# Patient Record
Sex: Male | Born: 1940 | Race: White | Hispanic: No | Marital: Married | Smoking: Never smoker
Health system: Southern US, Community
[De-identification: ages and names within clinical notes are randomized; demographics above are authoritative.]

## PROBLEM LIST (undated history)

## (undated) DIAGNOSIS — R778 Other specified abnormalities of plasma proteins: Secondary | ICD-10-CM

## (undated) DIAGNOSIS — R112 Nausea with vomiting, unspecified: Secondary | ICD-10-CM

## (undated) DIAGNOSIS — W5911XA Bitten by nonvenomous snake, initial encounter: Secondary | ICD-10-CM

## (undated) DIAGNOSIS — I10 Essential (primary) hypertension: Secondary | ICD-10-CM

## (undated) DIAGNOSIS — R079 Chest pain, unspecified: Secondary | ICD-10-CM

## (undated) DIAGNOSIS — R9439 Abnormal result of other cardiovascular function study: Secondary | ICD-10-CM

## (undated) DIAGNOSIS — R7309 Other abnormal glucose: Secondary | ICD-10-CM

## (undated) DIAGNOSIS — I471 Supraventricular tachycardia, unspecified: Secondary | ICD-10-CM

## (undated) DIAGNOSIS — I700 Atherosclerosis of aorta: Secondary | ICD-10-CM

## (undated) DIAGNOSIS — R7989 Other specified abnormal findings of blood chemistry: Secondary | ICD-10-CM

## (undated) DIAGNOSIS — R55 Syncope and collapse: Secondary | ICD-10-CM

## (undated) HISTORY — DX: Chest pain, unspecified: R07.9

## (undated) HISTORY — DX: Other abnormal glucose: R73.09

## (undated) HISTORY — DX: Atherosclerosis of aorta: I70.0

## (undated) HISTORY — DX: Abnormal result of other cardiovascular function study: R94.39

## (undated) HISTORY — DX: Bitten by nonvenomous snake, initial encounter: W59.11XA

## (undated) HISTORY — DX: Syncope and collapse: R55

## (undated) HISTORY — DX: Other specified abnormalities of plasma proteins: R77.8

## (undated) HISTORY — DX: Other specified abnormal findings of blood chemistry: R79.89

## (undated) HISTORY — DX: Supraventricular tachycardia, unspecified: I47.10

## (undated) HISTORY — DX: Supraventricular tachycardia: I47.1

## (undated) HISTORY — DX: Essential (primary) hypertension: I10

## (undated) HISTORY — DX: Nausea with vomiting, unspecified: R11.2

---

## 2016-11-17 DIAGNOSIS — R0682 Tachypnea, not elsewhere classified: Secondary | ICD-10-CM | POA: Diagnosis not present

## 2016-11-17 DIAGNOSIS — I2510 Atherosclerotic heart disease of native coronary artery without angina pectoris: Secondary | ICD-10-CM | POA: Diagnosis not present

## 2016-11-17 DIAGNOSIS — I10 Essential (primary) hypertension: Secondary | ICD-10-CM | POA: Diagnosis not present

## 2016-11-17 DIAGNOSIS — R55 Syncope and collapse: Secondary | ICD-10-CM | POA: Diagnosis not present

## 2016-11-17 DIAGNOSIS — R9430 Abnormal result of cardiovascular function study, unspecified: Secondary | ICD-10-CM | POA: Diagnosis not present

## 2016-11-18 DIAGNOSIS — Z7982 Long term (current) use of aspirin: Secondary | ICD-10-CM | POA: Diagnosis not present

## 2016-11-18 DIAGNOSIS — Z87891 Personal history of nicotine dependence: Secondary | ICD-10-CM | POA: Diagnosis not present

## 2016-11-18 DIAGNOSIS — R55 Syncope and collapse: Secondary | ICD-10-CM | POA: Diagnosis not present

## 2016-11-18 DIAGNOSIS — I10 Essential (primary) hypertension: Secondary | ICD-10-CM | POA: Diagnosis not present

## 2016-11-18 DIAGNOSIS — A09 Infectious gastroenteritis and colitis, unspecified: Secondary | ICD-10-CM | POA: Diagnosis not present

## 2016-11-18 DIAGNOSIS — Z833 Family history of diabetes mellitus: Secondary | ICD-10-CM | POA: Diagnosis not present

## 2016-11-18 DIAGNOSIS — R9430 Abnormal result of cardiovascular function study, unspecified: Secondary | ICD-10-CM | POA: Diagnosis not present

## 2016-11-18 DIAGNOSIS — Z8249 Family history of ischemic heart disease and other diseases of the circulatory system: Secondary | ICD-10-CM | POA: Diagnosis not present

## 2016-11-18 DIAGNOSIS — K529 Noninfective gastroenteritis and colitis, unspecified: Secondary | ICD-10-CM | POA: Diagnosis not present

## 2016-11-18 DIAGNOSIS — I214 Non-ST elevation (NSTEMI) myocardial infarction: Secondary | ICD-10-CM | POA: Diagnosis not present

## 2016-11-18 DIAGNOSIS — I2510 Atherosclerotic heart disease of native coronary artery without angina pectoris: Secondary | ICD-10-CM | POA: Diagnosis not present

## 2016-11-18 DIAGNOSIS — Z823 Family history of stroke: Secondary | ICD-10-CM | POA: Diagnosis not present

## 2016-11-19 ENCOUNTER — Observation Stay (HOSPITAL_COMMUNITY): Payer: Medicare Other

## 2016-11-19 ENCOUNTER — Inpatient Hospital Stay (HOSPITAL_COMMUNITY)
Admission: AD | Admit: 2016-11-19 | Discharge: 2016-11-29 | DRG: 234 | Disposition: A | Discharge status: Home / Self Care | Payer: Medicare Other | Source: Other Acute Inpatient Hospital | Attending: Cardiothoracic Surgery | Admitting: Cardiothoracic Surgery

## 2016-11-19 ENCOUNTER — Encounter (HOSPITAL_COMMUNITY): Payer: Self-pay

## 2016-11-19 DIAGNOSIS — K529 Noninfective gastroenteritis and colitis, unspecified: Secondary | ICD-10-CM | POA: Diagnosis present

## 2016-11-19 DIAGNOSIS — Z951 Presence of aortocoronary bypass graft: Secondary | ICD-10-CM

## 2016-11-19 DIAGNOSIS — Z8249 Family history of ischemic heart disease and other diseases of the circulatory system: Secondary | ICD-10-CM

## 2016-11-19 DIAGNOSIS — R778 Other specified abnormalities of plasma proteins: Secondary | ICD-10-CM

## 2016-11-19 DIAGNOSIS — R55 Syncope and collapse: Secondary | ICD-10-CM

## 2016-11-19 DIAGNOSIS — R748 Abnormal levels of other serum enzymes: Secondary | ICD-10-CM | POA: Diagnosis not present

## 2016-11-19 DIAGNOSIS — R112 Nausea with vomiting, unspecified: Secondary | ICD-10-CM

## 2016-11-19 DIAGNOSIS — Z01810 Encounter for preprocedural cardiovascular examination: Secondary | ICD-10-CM | POA: Diagnosis not present

## 2016-11-19 DIAGNOSIS — R079 Chest pain, unspecified: Secondary | ICD-10-CM | POA: Diagnosis present

## 2016-11-19 DIAGNOSIS — E8770 Fluid overload, unspecified: Secondary | ICD-10-CM | POA: Diagnosis not present

## 2016-11-19 DIAGNOSIS — E871 Hypo-osmolality and hyponatremia: Secondary | ICD-10-CM | POA: Diagnosis present

## 2016-11-19 DIAGNOSIS — I214 Non-ST elevation (NSTEMI) myocardial infarction: Secondary | ICD-10-CM | POA: Diagnosis not present

## 2016-11-19 DIAGNOSIS — D62 Acute posthemorrhagic anemia: Secondary | ICD-10-CM | POA: Diagnosis not present

## 2016-11-19 DIAGNOSIS — I25110 Atherosclerotic heart disease of native coronary artery with unstable angina pectoris: Secondary | ICD-10-CM | POA: Diagnosis not present

## 2016-11-19 DIAGNOSIS — I471 Supraventricular tachycardia: Secondary | ICD-10-CM | POA: Diagnosis not present

## 2016-11-19 DIAGNOSIS — R7989 Other specified abnormal findings of blood chemistry: Secondary | ICD-10-CM

## 2016-11-19 DIAGNOSIS — R002 Palpitations: Secondary | ICD-10-CM | POA: Diagnosis not present

## 2016-11-19 DIAGNOSIS — R9439 Abnormal result of other cardiovascular function study: Secondary | ICD-10-CM

## 2016-11-19 DIAGNOSIS — I10 Essential (primary) hypertension: Secondary | ICD-10-CM

## 2016-11-19 DIAGNOSIS — I2510 Atherosclerotic heart disease of native coronary artery without angina pectoris: Secondary | ICD-10-CM

## 2016-11-19 DIAGNOSIS — Z87891 Personal history of nicotine dependence: Secondary | ICD-10-CM

## 2016-11-19 DIAGNOSIS — J42 Unspecified chronic bronchitis: Secondary | ICD-10-CM | POA: Diagnosis not present

## 2016-11-19 DIAGNOSIS — J9811 Atelectasis: Secondary | ICD-10-CM | POA: Diagnosis not present

## 2016-11-19 DIAGNOSIS — R072 Precordial pain: Secondary | ICD-10-CM | POA: Diagnosis not present

## 2016-11-19 DIAGNOSIS — I951 Orthostatic hypotension: Secondary | ICD-10-CM | POA: Diagnosis not present

## 2016-11-19 LAB — CBC WITH DIFFERENTIAL/PLATELET
BASOS ABS: 0.3 10*3/uL — AB (ref 0.0–0.1)
Basophils Relative: 3 %
EOS ABS: 0 10*3/uL (ref 0.0–0.7)
Eosinophils Relative: 0 %
HCT: 38.3 % — ABNORMAL LOW (ref 39.0–52.0)
Hemoglobin: 13.1 g/dL (ref 13.0–17.0)
LYMPHS ABS: 5.7 10*3/uL — AB (ref 0.7–4.0)
Lymphocytes Relative: 65 %
MCH: 29.2 pg (ref 26.0–34.0)
MCHC: 34.2 g/dL (ref 30.0–36.0)
MCV: 85.5 fL (ref 78.0–100.0)
Monocytes Absolute: 0.9 10*3/uL (ref 0.1–1.0)
Monocytes Relative: 10 %
NEUTROS ABS: 2 10*3/uL (ref 1.7–7.7)
Neutrophils Relative %: 22 %
PLATELETS: 139 10*3/uL — AB (ref 150–400)
RBC: 4.48 MIL/uL (ref 4.22–5.81)
RDW: 13.4 % (ref 11.5–15.5)
WBC: 8.9 10*3/uL (ref 4.0–10.5)

## 2016-11-19 LAB — TSH: TSH: 1.637 u[IU]/mL (ref 0.350–4.500)

## 2016-11-19 LAB — PROTIME-INR
INR: 1.2
PROTHROMBIN TIME: 15.3 s — AB (ref 11.4–15.2)

## 2016-11-19 LAB — COMPREHENSIVE METABOLIC PANEL
ALT: 47 U/L (ref 17–63)
AST: 48 U/L — ABNORMAL HIGH (ref 15–41)
Albumin: 3.3 g/dL — ABNORMAL LOW (ref 3.5–5.0)
Alkaline Phosphatase: 58 U/L (ref 38–126)
Anion gap: 7 (ref 5–15)
BILIRUBIN TOTAL: 0.7 mg/dL (ref 0.3–1.2)
BUN: 15 mg/dL (ref 6–20)
CHLORIDE: 100 mmol/L — AB (ref 101–111)
CO2: 25 mmol/L (ref 22–32)
CREATININE: 1 mg/dL (ref 0.61–1.24)
Calcium: 8.9 mg/dL (ref 8.9–10.3)
Glucose, Bld: 138 mg/dL — ABNORMAL HIGH (ref 65–99)
POTASSIUM: 4.8 mmol/L (ref 3.5–5.1)
Sodium: 132 mmol/L — ABNORMAL LOW (ref 135–145)
TOTAL PROTEIN: 6.6 g/dL (ref 6.5–8.1)

## 2016-11-19 LAB — BRAIN NATRIURETIC PEPTIDE: B Natriuretic Peptide: 15.5 pg/mL (ref 0.0–100.0)

## 2016-11-19 LAB — APTT: APTT: 35 s (ref 24–36)

## 2016-11-19 LAB — MAGNESIUM: MAGNESIUM: 2 mg/dL (ref 1.7–2.4)

## 2016-11-19 LAB — TROPONIN I: Troponin I: 0.03 ng/mL (ref <0.03)

## 2016-11-19 MED ORDER — ACETAMINOPHEN 325 MG PO TABS
650.0000 mg | ORAL_TABLET | ORAL | Status: DC | PRN
  Administered 2016-11-19 – 2016-11-20 (×2): 650 mg via ORAL
  Filled 2016-11-19 (×2): qty 2

## 2016-11-19 MED ORDER — ONDANSETRON HCL 4 MG/2ML IJ SOLN: 4.0000 mg | Freq: Four times a day (QID) | INTRAMUSCULAR | Status: DC | PRN

## 2016-11-19 MED ORDER — SODIUM CHLORIDE 0.9% FLUSH
3.0000 mL | Freq: Two times a day (BID) | INTRAVENOUS | Status: DC
  Administered 2016-11-22 – 2016-11-23 (×3): 3 mL via INTRAVENOUS

## 2016-11-19 MED ORDER — ASPIRIN EC 81 MG PO TBEC
81.0000 mg | DELAYED_RELEASE_TABLET | Freq: Every day | ORAL | Status: DC
  Administered 2016-11-20 – 2016-11-23 (×3): 81 mg via ORAL
  Filled 2016-11-19 (×3): qty 1

## 2016-11-19 MED ORDER — SODIUM CHLORIDE 0.9% FLUSH: 3.0000 mL | INTRAVENOUS | Status: DC | PRN

## 2016-11-19 MED ORDER — SODIUM CHLORIDE 0.9 % IV SOLN: 250.0000 mL | INTRAVENOUS | Status: DC | PRN

## 2016-11-19 MED ORDER — SODIUM CHLORIDE 0.9% FLUSH
3.0000 mL | Freq: Two times a day (BID) | INTRAVENOUS | Status: DC
  Administered 2016-11-19 – 2016-11-23 (×2): 3 mL via INTRAVENOUS

## 2016-11-19 MED ORDER — HEPARIN SODIUM (PORCINE) 5000 UNIT/ML IJ SOLN
5000.0000 [IU] | Freq: Three times a day (TID) | INTRAMUSCULAR | Status: DC
  Administered 2016-11-19 – 2016-11-21 (×5): 5000 [IU] via SUBCUTANEOUS
  Filled 2016-11-19 (×5): qty 1

## 2016-11-19 NOTE — Progress Notes (Signed)
CCMD reported that pt had a run of SVT with HR reaching the 140's. Pt was resting in bed, states that he is fine and felt nothing. No cardiac related sx's noted. Will continue to monitor.  Viviano SimasMorgan N Mical Brun, RN

## 2016-11-19 NOTE — H&P (Signed)
CARDIOLOGY HISTORY & PHYSICAL   Referring Physician: [facility] Primary Physician: none  Primary Cardiologist: none Reason for Admission: elevated troponin  HPI: Mr. Mustin is a 76 year old man without significant medical history who is admitted as a transfer from [facility] for an elevated troponin. Initial report had been that the patient had chest pain, but to me the patient denies any clear chest pain events. He states that he had shoulder pain for about a week, and 5-6 days ago he began feeling cold. He developed nausea and vomiting 5 days ago, with progressed over the weekend. He states he hasn't had much to eat or drink in a week. 3 days ago while at work, he felt poorly and had a near syncopal episode while working. He does not remember much before or after the event, but it was witnessed with no LOC and no injury. He denies chest pain, shortness of breath, or diaphoresis. He feels fatigued.  His PMH is notable for a snake bite in childhood. He denies current medical conditions and does not see a doctor routinely. He is on no medications, takes only a multivitamin. Had an adverse reaction to ativan but no other allergies.  His family history is notable for MI in his mother, brother, and father.  He is a distant smoker (1 pack/week about 50 years ago, for a very short time). No alcohol or illicits. Very active in daily life.  Review of Systems:     Cardiac Review of Systems: {Y] = yes [ ]  = no  Chest Pain [ N   ]  Resting SOB [ N  ] Exertional SOB  [ N ]  Orthopnea Klaus.Mock  ]   Pedal Edema [ N  ]    Palpitations Klaus.Mock  ] Syncope  [ N ]   Presyncope [ Y  ]  General Review of Systems: [Y] = yes [  ]=no Constitional: recent weight change [  ]; anorexia [  ]; fatigue [ Y ]; nausea [ Y ]; night sweats [  ]; fever [Y  ]; or chills [ Y ];                                                                     Eyes : blurred vision [  ]; diplopia [   ]; vision changes [  ];  Amaurosis fugax[   ]; Resp: cough [  ];  wheezing[  ];  hemoptysis[  ];  PND [  ];  GI:  gallstones[  ], vomiting[  ];  dysphagia[  ]; melena[  ];  hematochezia [  ]; heartburn[  ];   GU: kidney stones [  ]; hematuria[  ];   dysuria [  ];  nocturia[  ]; incontinence [  ];             Skin: rash, swelling[  ];, hair loss[  ];  peripheral edema[  ];  or itching[  ]; Musculosketetal: myalgias[  ];  joint swelling[  ];  joint erythema[  ];  joint pain[  ];  back pain[  ];  Heme/Lymph: bruising[  ];  bleeding[  ];  anemia[  ];  Neuro: TIA[  ];  headaches[  ];  stroke[  ];  vertigo[  ];  seizures[  ];   paresthesias[  ];  difficulty walking[  ];  Psych:depression[  ]; anxiety[  ];  Endocrine: diabetes[  ];  thyroid dysfunction[  ];  Other:  No past medical history on file.  Medications Prior to Admission  Medication Sig Dispense Refill  . acetaminophen (TYLENOL) 500 MG tablet Take 1,000 mg by mouth every 6 (six) hours as needed for mild pain.    Marland Kitchen. aspirin EC 325 MG tablet Take 325 mg by mouth daily.    Marland Kitchen. GINSENG PO Take 2 tablets by mouth daily.    . Multiple Vitamin (ONE-A-DAY MENS PO) Take 1 tablet by mouth daily.       Melene Muller. [START ON 11/20/2016] aspirin EC  81 mg Oral Daily  . heparin  5,000 Units Subcutaneous Q8H  . sodium chloride flush  3 mL Intravenous Q12H  . sodium chloride flush  3 mL Intravenous Q12H    Infusions: . sodium chloride      Allergies  Allergen Reactions  . Ativan [Lorazepam] Other (See Comments)    "goes wild"    Social History   Social History  . Marital status: N/A    Spouse name: N/A  . Number of children: N/A  . Years of education: N/A   Occupational History  . Not on file.   Social History Main Topics  . Smoking status: Not on file  . Smokeless tobacco: Not on file  . Alcohol use Not on file  . Drug use: Unknown  . Sexual activity: Not on file   Other Topics Concern  . Not on file   Social History Narrative  . No narrative on file    No family history  on file.  PHYSICAL EXAM: Vitals:   11/19/16 2059  BP: (!) 157/87  Pulse: (!) 101  Resp: 20  Temp: (!) 101 F (38.3 C)    No intake or output data in the 24 hours ending 11/19/16 2209  General:  Well appearing. No respiratory difficulty HEENT: normal Neck: supple. no JVD. Carotids 2+ bilat; no bruits. No lymphadenopathy or thryomegaly appreciated. Cor: PMI nondisplaced. Regular rate & rhythm. No rubs, gallops or murmurs. Lungs: clear Abdomen: soft, nontender, nondistended. No hepatosplenomegaly. No bruits or masses. Good bowel sounds. Extremities: no cyanosis, clubbing, rash, edema Neuro: alert & oriented x 3, cranial nerves grossly intact. moves all 4 extremities w/o difficulty. Affect pleasant.  ECG: NSR  No results found for this or any previous visit (from the past 24 hour(s)). X-ray Chest Pa And Lateral  Result Date: 11/19/2016 CLINICAL DATA:  Syncopal episode tonight. EXAM: CHEST  2 VIEW COMPARISON:  None. FINDINGS: Borderline enlarged cardiac silhouette. Linear density at both lung bases. Mild diffuse peribronchial thickening and mild flattening of the hemidiaphragms on the lateral view. Thoracic spine degenerative changes. IMPRESSION: 1. Mild linear atelectasis or scarring at both lung bases. 2. Mild changes of COPD and chronic bronchitis. Electronically Signed   By: Beckie SaltsSteven  Reid M.D.   On: 11/19/2016 23:19    Recent Labs Lab 11/19/16 2225  TROPONINI 0.03*    Recent Labs Lab 11/19/16 2225  NA 132*  K 4.8  CL 100*  CO2 25  BUN 15  CREATININE 1.00  CALCIUM 8.9  PROT 6.6  BILITOT 0.7  ALKPHOS 58  ALT 47  AST 48*  GLUCOSE 138*    Recent Labs Lab 11/19/16 2225  WBC 8.9  HGB 13.1  HCT 38.3*  PLT 139*   TSH, BNP  WNL A1c pending Lipids pending  ASSESSMENT/PLAN: Mr. Inda MerlinHickman is a 76 yo man without significant PMH who was found to have a low level elevation of his trop[facility]. Here, his troponin is 0.03. He denies any chest pain or  shortness of breath, and his clinical history suggests that he has been suffering from nausea/vomiting, chills, and fever with poor PO intake for several days and a recent episode of near syncope.  1) Elevated troponin: will trend for now. Suspect this is in the setting of GI illness. Will not treat as ACS for now. If he develops symptoms, check ECG and consider starting ACS treatment -no known cardiac history but has family history. Could consider stress test for evaluation, though if he is still having GI issues he may not feel well enough to do a good test -on no cardiac meds at home. Will start aspirin 81 mg in the hospital. Based on lipids and result of further evaluation, could consider statin -BP elevated while feeling ill. Would monitor, may require a BP med long term, unknown what his recent baseline BP is  2) Nausea, vomiting -will give gentle fluids overnight -zofran PRN -tentatively NPO for possible stress, would start on clears and advance if no testing planned  FULL CODE Jodelle RedBridgette Deland Slocumb, MD, PhD, cardiology overnight provider

## 2016-11-19 NOTE — Progress Notes (Signed)
[facility] from [facility]. Pt is stable, alert and oriented with no CP. Pt has a temp of 101. Cardiology has been paged and will be seeing pt shortly. Admission documentation completed, see flowsheet. Will continue to monitor pt until orders received.  Viviano SimasMorgan N Jamai Dolce, RN

## 2016-11-20 ENCOUNTER — Observation Stay (HOSPITAL_COMMUNITY): Payer: Medicare Other

## 2016-11-20 DIAGNOSIS — I10 Essential (primary) hypertension: Secondary | ICD-10-CM | POA: Diagnosis not present

## 2016-11-20 DIAGNOSIS — I471 Supraventricular tachycardia: Secondary | ICD-10-CM | POA: Diagnosis not present

## 2016-11-20 DIAGNOSIS — R778 Other specified abnormalities of plasma proteins: Secondary | ICD-10-CM

## 2016-11-20 DIAGNOSIS — R072 Precordial pain: Secondary | ICD-10-CM | POA: Diagnosis not present

## 2016-11-20 DIAGNOSIS — R55 Syncope and collapse: Secondary | ICD-10-CM | POA: Diagnosis not present

## 2016-11-20 DIAGNOSIS — R748 Abnormal levels of other serum enzymes: Secondary | ICD-10-CM

## 2016-11-20 DIAGNOSIS — R112 Nausea with vomiting, unspecified: Secondary | ICD-10-CM | POA: Diagnosis not present

## 2016-11-20 DIAGNOSIS — R7989 Other specified abnormal findings of blood chemistry: Secondary | ICD-10-CM

## 2016-11-20 LAB — NM MYOCAR MULTI W/SPECT W/WALL MOTION / EF
CHL CUP MPHR: 146 {beats}/min
CSEPPHR: 109 {beats}/min
Percent HR: 74 %
Rest HR: 88 {beats}/min

## 2016-11-20 LAB — LIPID PANEL
CHOLESTEROL: 104 mg/dL (ref 0–200)
HDL: 10 mg/dL — ABNORMAL LOW (ref >40)
LDL CALC: 64 mg/dL (ref 0–99)
TRIGLYCERIDES: 151 mg/dL — AB (ref <150)
Total CHOL/HDL Ratio: 10.4 RATIO
VLDL: 30 mg/dL (ref 0–40)

## 2016-11-20 LAB — BASIC METABOLIC PANEL
ANION GAP: 8 (ref 5–15)
BUN: 15 mg/dL (ref 6–20)
CALCIUM: 8.8 mg/dL — AB (ref 8.9–10.3)
CO2: 23 mmol/L (ref 22–32)
Chloride: 104 mmol/L (ref 101–111)
Creatinine, Ser: 0.98 mg/dL (ref 0.61–1.24)
Glucose, Bld: 109 mg/dL — ABNORMAL HIGH (ref 65–99)
Potassium: 4 mmol/L (ref 3.5–5.1)
Sodium: 135 mmol/L (ref 135–145)

## 2016-11-20 LAB — TROPONIN I

## 2016-11-20 LAB — HEPATIC FUNCTION PANEL
ALBUMIN: 3 g/dL — AB (ref 3.5–5.0)
ALT: 47 U/L (ref 17–63)
AST: 48 U/L — AB (ref 15–41)
Alkaline Phosphatase: 55 U/L (ref 38–126)
Bilirubin, Direct: 0.2 mg/dL (ref 0.1–0.5)
Indirect Bilirubin: 0.4 mg/dL (ref 0.3–0.9)
TOTAL PROTEIN: 6.1 g/dL — AB (ref 6.5–8.1)
Total Bilirubin: 0.6 mg/dL (ref 0.3–1.2)

## 2016-11-20 LAB — CBC
HCT: 38.2 % — ABNORMAL LOW (ref 39.0–52.0)
HEMOGLOBIN: 12.9 g/dL — AB (ref 13.0–17.0)
MCH: 29.3 pg (ref 26.0–34.0)
MCHC: 33.8 g/dL (ref 30.0–36.0)
MCV: 86.6 fL (ref 78.0–100.0)
PLATELETS: 138 10*3/uL — AB (ref 150–400)
RBC: 4.41 MIL/uL (ref 4.22–5.81)
RDW: 13.9 % (ref 11.5–15.5)
WBC: 8.6 10*3/uL (ref 4.0–10.5)

## 2016-11-20 LAB — LIPASE, BLOOD: Lipase: 28 U/L (ref 11–51)

## 2016-11-20 LAB — PROTIME-INR
INR: 1.24
PROTHROMBIN TIME: 15.7 s — AB (ref 11.4–15.2)

## 2016-11-20 LAB — APTT: aPTT: 38 seconds — ABNORMAL HIGH (ref 24–36)

## 2016-11-20 MED ORDER — SODIUM CHLORIDE 0.9 % IV SOLN: 250.0000 mL | INTRAVENOUS | Status: DC | PRN

## 2016-11-20 MED ORDER — SODIUM CHLORIDE 0.9 % WEIGHT BASED INFUSION
1.0000 mL/kg/h | INTRAVENOUS | Status: DC
  Administered 2016-11-21: 1 mL/kg/h via INTRAVENOUS

## 2016-11-20 MED ORDER — TECHNETIUM TC 99M TETROFOSMIN IV KIT
30.0000 | PACK | Freq: Once | INTRAVENOUS | Status: AC | PRN
  Administered 2016-11-20: 30 via INTRAVENOUS

## 2016-11-20 MED ORDER — TECHNETIUM TC 99M TETROFOSMIN IV KIT
10.0000 | PACK | Freq: Once | INTRAVENOUS | Status: AC | PRN
  Administered 2016-11-20: 10 via INTRAVENOUS

## 2016-11-20 MED ORDER — ONDANSETRON HCL 4 MG/2ML IJ SOLN
4.0000 mg | Freq: Once | INTRAMUSCULAR | Status: AC
  Administered 2016-11-20: 4 mg via INTRAVENOUS
  Filled 2016-11-20: qty 2

## 2016-11-20 MED ORDER — REGADENOSON 0.4 MG/5ML IV SOLN
0.4000 mg | Freq: Once | INTRAVENOUS | Status: AC
  Administered 2016-11-20: 0.4 mg via INTRAVENOUS
  Filled 2016-11-20: qty 5

## 2016-11-20 MED ORDER — REGADENOSON 0.4 MG/5ML IV SOLN
INTRAVENOUS | Status: AC
  Administered 2016-11-20: 0.4 mg via INTRAVENOUS
  Filled 2016-11-20: qty 5

## 2016-11-20 MED ORDER — SODIUM CHLORIDE 0.9 % IV SOLN
INTRAVENOUS | Status: AC
  Administered 2016-11-20: 01:00:00 via INTRAVENOUS

## 2016-11-20 MED ORDER — ASPIRIN 81 MG PO CHEW
81.0000 mg | CHEWABLE_TABLET | ORAL | Status: AC
  Administered 2016-11-21: 81 mg via ORAL
  Filled 2016-11-20: qty 1

## 2016-11-20 MED ORDER — SODIUM CHLORIDE 0.9% FLUSH
3.0000 mL | Freq: Two times a day (BID) | INTRAVENOUS | Status: DC
  Administered 2016-11-20: 3 mL via INTRAVENOUS

## 2016-11-20 MED ORDER — SODIUM CHLORIDE 0.9 % WEIGHT BASED INFUSION
3.0000 mL/kg/h | INTRAVENOUS | Status: DC
  Administered 2016-11-21: 3 mL/kg/h via INTRAVENOUS

## 2016-11-20 MED ORDER — SODIUM CHLORIDE 0.9% FLUSH: 3.0000 mL | INTRAVENOUS | Status: DC | PRN

## 2016-11-20 NOTE — Progress Notes (Signed)
   Cordarrel Golden presented for a nuclear stress test today.  No immediate complications.  Stress imaging is pending at this time.  Preliminary EKG findings may be listed in the chart, but the stress test result will not be finalized until perfusion imaging is complete.  Roe Rutherfordngela Nicole Kazuto Sevey, PA-C 11/20/2016, 3:17 PM

## 2016-11-20 NOTE — Plan of Care (Signed)
Problem: Education: Goal: Understanding of CV disease, CV risk reduction, and recovery process will improve Outcome: Progressing Pt has watched the educational video on the cardiac cath procedure. All questions and concerns have been addressed and answered.

## 2016-11-20 NOTE — Progress Notes (Signed)
Stress test was abnormal. He is scheduled for heart catheterization tomorrow 11/21/16 with Dr. Tresa EndoKelly at 3pm.   Marcelino Dusterngela Nicole Duke, PA-C 11/20/2016, 5:01 PM [phone] [facility]

## 2016-11-20 NOTE — Progress Notes (Signed)
Progress Note  Patient Name: Jesus FerrettiJohn Curtis Date of Encounter: 11/20/2016  Primary Cardiologist: New  Subjective   Patient admitted yesterday but the cardiology fellow as a transfer from [place]. There was concern about chest pain that prompted the admission but the patient denied chest pain for me today and he denied chest pain for the fellow. Family members who are present also endorse he has not had any chest pains the past week that he has told them about.  He has had nausea with vomiting whenever he tries to eat for the last week associated with fevers and chills. No abdominal pain or back pain. He did have 3-4 days of left shoulder soreness which he said improved when he would move and massage it. He went to work yesterday and got lightheaded and passed out per family. They report his BP being elevated at that time. Last night he developed a fever of 101 and was given Tylenol. He denies SOB or SOB with exertion.   At [facility] her reportedly had a mildly elevated troponin. Normal here x 3. Plan per fellow is to consider stress test evaluation, he likely has GI illness and may not feel well enough for test.  He received gentle fluids overnight, he is feeling much better and requesting discharge.  Blood pressure 140/84, pulse 74, temperature 97.8 F (36.6 C), temperature source Oral, resp. rate 19, height 5\' 8"  (1.727 m), weight 167 lb 6.4 oz (75.9 kg), SpO2 100 %.  Inpatient Medications    Scheduled Meds: . aspirin EC  81 mg Oral Daily  . heparin  5,000 Units Subcutaneous Q8H  . sodium chloride flush  3 mL Intravenous Q12H  . sodium chloride flush  3 mL Intravenous Q12H   Continuous Infusions: . sodium chloride     PRN Meds: sodium chloride, acetaminophen, ondansetron (ZOFRAN) IV, sodium chloride flush   Vital Signs    Vitals:   11/19/16 2059 11/20/16 0129 11/20/16 0455  BP: (!) 157/87  140/84  Pulse: (!) 101  74  Resp: 20  19  Temp: (!) 101 F (38.3 C) 97.8 F (36.6  C) 97.8 F (36.6 C)  TempSrc: Oral Oral Oral  SpO2: 94%  100%  Weight: 167 lb 6.4 oz (75.9 kg)  167 lb 6.4 oz (75.9 kg)  Height: 5\' 8"  (1.727 m)      Intake/Output Summary (Last 24 hours) at 11/20/16 1218 Last data filed at 11/20/16 0900  Gross per 24 hour  Intake              160 ml  Output                0 ml  Net              160 ml   Filed Weights   11/19/16 2059 11/20/16 0455  Weight: 167 lb 6.4 oz (75.9 kg) 167 lb 6.4 oz (75.9 kg)    Telemetry    Had an episode of SVT, rate 140s - Personally Reviewed  Physical Exam   GEN: Well nourished, well developed HEENT: normal  Neck: no JVD, carotid bruits, or masses Cardiac: RRR. no murmurs, rubs, or gallops,no edema. Intact distal pulses bilaterally.  Respiratory: clear to auscultation bilaterally, normal work of breathing GI: soft, nontender, nondistended, + BS MS: no deformity or atrophy  Skin: warm and dry, no rash Neuro: Alert and Oriented x 3, Strength and sensation are intact Psych:   Full affect  Labs    Chemistry  Recent Labs Lab 11/19/16 2225 11/20/16 0321  NA 132* 135  K 4.8 4.0  CL 100* 104  CO2 25 23  GLUCOSE 138* 109*  BUN 15 15  CREATININE 1.00 0.98  CALCIUM 8.9 8.8*  PROT 6.6  --   ALBUMIN 3.3*  --   AST 48*  --   ALT 47  --   ALKPHOS 58  --   BILITOT 0.7  --   GFRNONAA >60 >60  GFRAA >60 >60  ANIONGAP 7 8     Hematology Recent Labs Lab 11/19/16 2225 11/20/16 0321  WBC 8.9 8.6  RBC 4.48 4.41  HGB 13.1 12.9*  HCT 38.3* 38.2*  MCV 85.5 86.6  MCH 29.2 29.3  MCHC 34.2 33.8  RDW 13.4 13.9  PLT 139* 138*    Cardiac Enzymes Recent Labs Lab 11/19/16 2225 11/20/16 0321 11/20/16 0917  TROPONINI 0.03* <0.03 <0.03   No results for input(s): TROPIPOC in the last 168 hours.   BNP Recent Labs Lab 11/19/16 2225  BNP 15.5      Radiology    X-ray Chest Pa And Lateral  Result Date: 11/19/2016 CLINICAL DATA:  Syncopal episode tonight. EXAM: CHEST  2 VIEW COMPARISON:  None.  FINDINGS: Borderline enlarged cardiac silhouette. Linear density at both lung bases. Mild diffuse peribronchial thickening and mild flattening of the hemidiaphragms on the lateral view. Thoracic spine degenerative changes. IMPRESSION: 1. Mild linear atelectasis or scarring at both lung bases. 2. Mild changes of COPD and chronic bronchitis. Electronically Signed   By: Beckie Salts M.D.   On: 11/19/2016 23:19   Cardiac Studies   Echocardiogram pending this admission.    Patient Profile     Mr. Jesus Curtis is a 76 yo man without significant PMH who was found to have a low level elevation of his troponin at [facility]. Here, his troponin is 0.03. He denies any chest pain or shortness of breath, and his clinical history suggests that he has been suffering from nausea/vomiting, chills, and fever with poor PO intake for several days and a recent episode of near syncope  Assessment & Plan    1. SVT: Had one episode of brief SVT rates in the 140s. Will order cardiac echocardiogram for further evaluation. Further recommendation per echocardiogram.  1. Elevated troponin:Mildly elevated, orthostatic hypotension likely leaving to near syncope. He has had three negative troponins here x 3.  2. Nausea and vomiting:  Pt symptoms are consistent with gastroenteritis. Normal WBC, no metabolic derangement.  Will order urinalysis, lipase, hepatic function panel and a KUB to further evaluate abdominal pain. He is feeling better and has had no further episodes of vomiting but has also been NPO.  Side Note: Prior to discharge the patient  needs to be able to pass a fluid/PO trial.   Signed, Jesus Matas, PA-C  11/20/2016, 12:18 PM     The patient was seen, examined and discussed with Jesus Pel, PA-C and I agree with the above.   76 yo man without significant PMH, who presented to [facility] with nausea, fever/chills, a syncopal episode post episode of chills, abdominal pain, he was transferred to [facility] for  borderline troponin 0.03, normal subsequent troponins. He denies any chest pain or shortness of breath.  ECG is unremarkable. He has risk factors - age, sex, HTN, we will proceed with a stress test and discharge later today if normal. The patient feels better and would like to go home. We will arrange for a follow up. Start losartan 25  mg po daily for hypertension.   Tobias AlexanderKatarina Sheppard Luckenbach, MD 11/20/2016

## 2016-11-21 ENCOUNTER — Observation Stay (HOSPITAL_COMMUNITY): Payer: Medicare Other

## 2016-11-21 ENCOUNTER — Other Ambulatory Visit: Payer: Self-pay | Admitting: *Deleted

## 2016-11-21 ENCOUNTER — Encounter (HOSPITAL_COMMUNITY): Payer: Self-pay | Admitting: Cardiovascular Disease

## 2016-11-21 ENCOUNTER — Encounter (HOSPITAL_COMMUNITY)
Admission: AD | Disposition: A | Discharge status: Home / Self Care | Payer: Self-pay | Source: Other Acute Inpatient Hospital | Attending: Cardiothoracic Surgery

## 2016-11-21 DIAGNOSIS — D62 Acute posthemorrhagic anemia: Secondary | ICD-10-CM | POA: Diagnosis not present

## 2016-11-21 DIAGNOSIS — I2510 Atherosclerotic heart disease of native coronary artery without angina pectoris: Secondary | ICD-10-CM

## 2016-11-21 DIAGNOSIS — R55 Syncope and collapse: Secondary | ICD-10-CM | POA: Diagnosis not present

## 2016-11-21 DIAGNOSIS — E8770 Fluid overload, unspecified: Secondary | ICD-10-CM | POA: Diagnosis not present

## 2016-11-21 DIAGNOSIS — I471 Supraventricular tachycardia: Secondary | ICD-10-CM | POA: Diagnosis not present

## 2016-11-21 DIAGNOSIS — R072 Precordial pain: Secondary | ICD-10-CM | POA: Diagnosis not present

## 2016-11-21 DIAGNOSIS — E871 Hypo-osmolality and hyponatremia: Secondary | ICD-10-CM | POA: Diagnosis not present

## 2016-11-21 DIAGNOSIS — I214 Non-ST elevation (NSTEMI) myocardial infarction: Secondary | ICD-10-CM | POA: Diagnosis not present

## 2016-11-21 DIAGNOSIS — I951 Orthostatic hypotension: Secondary | ICD-10-CM | POA: Diagnosis not present

## 2016-11-21 DIAGNOSIS — Z8249 Family history of ischemic heart disease and other diseases of the circulatory system: Secondary | ICD-10-CM | POA: Diagnosis not present

## 2016-11-21 DIAGNOSIS — J9811 Atelectasis: Secondary | ICD-10-CM | POA: Diagnosis not present

## 2016-11-21 DIAGNOSIS — I358 Other nonrheumatic aortic valve disorders: Secondary | ICD-10-CM | POA: Diagnosis not present

## 2016-11-21 DIAGNOSIS — R112 Nausea with vomiting, unspecified: Secondary | ICD-10-CM | POA: Diagnosis not present

## 2016-11-21 DIAGNOSIS — Z87891 Personal history of nicotine dependence: Secondary | ICD-10-CM | POA: Diagnosis not present

## 2016-11-21 DIAGNOSIS — R9439 Abnormal result of other cardiovascular function study: Secondary | ICD-10-CM

## 2016-11-21 DIAGNOSIS — R079 Chest pain, unspecified: Secondary | ICD-10-CM | POA: Diagnosis not present

## 2016-11-21 DIAGNOSIS — Z01810 Encounter for preprocedural cardiovascular examination: Secondary | ICD-10-CM | POA: Diagnosis not present

## 2016-11-21 DIAGNOSIS — R748 Abnormal levels of other serum enzymes: Secondary | ICD-10-CM | POA: Diagnosis not present

## 2016-11-21 DIAGNOSIS — Z01818 Encounter for other preprocedural examination: Secondary | ICD-10-CM | POA: Diagnosis not present

## 2016-11-21 DIAGNOSIS — K529 Noninfective gastroenteritis and colitis, unspecified: Secondary | ICD-10-CM | POA: Diagnosis not present

## 2016-11-21 DIAGNOSIS — R002 Palpitations: Secondary | ICD-10-CM | POA: Diagnosis not present

## 2016-11-21 DIAGNOSIS — Z4682 Encounter for fitting and adjustment of non-vascular catheter: Secondary | ICD-10-CM | POA: Diagnosis not present

## 2016-11-21 DIAGNOSIS — I25110 Atherosclerotic heart disease of native coronary artery with unstable angina pectoris: Secondary | ICD-10-CM | POA: Diagnosis not present

## 2016-11-21 DIAGNOSIS — I10 Essential (primary) hypertension: Secondary | ICD-10-CM | POA: Diagnosis not present

## 2016-11-21 HISTORY — PX: LEFT HEART CATH AND CORONARY ANGIOGRAPHY: CATH118249

## 2016-11-21 LAB — URINALYSIS, ROUTINE W REFLEX MICROSCOPIC
Bilirubin Urine: NEGATIVE
Glucose, UA: NEGATIVE mg/dL
Hgb urine dipstick: NEGATIVE
Ketones, ur: NEGATIVE mg/dL
Leukocytes, UA: NEGATIVE
Nitrite: NEGATIVE
Protein, ur: NEGATIVE mg/dL
Specific Gravity, Urine: 1.043 — ABNORMAL HIGH (ref 1.005–1.030)
pH: 5 (ref 5.0–8.0)

## 2016-11-21 LAB — BASIC METABOLIC PANEL
ANION GAP: 7 (ref 5–15)
BUN: 15 mg/dL (ref 6–20)
CALCIUM: 8.5 mg/dL — AB (ref 8.9–10.3)
CO2: 22 mmol/L (ref 22–32)
CREATININE: 0.92 mg/dL (ref 0.61–1.24)
Chloride: 105 mmol/L (ref 101–111)
GLUCOSE: 102 mg/dL — AB (ref 65–99)
Potassium: 4.1 mmol/L (ref 3.5–5.1)
Sodium: 134 mmol/L — ABNORMAL LOW (ref 135–145)

## 2016-11-21 LAB — PULMONARY FUNCTION TEST
FEF 25-75 Pre: 1.91 L/sec
FEF2575-%Pred-Pre: 92 %
FEV1-%Pred-Pre: 78 %
FEV1-Pre: 2.22 L
FEV1FVC-%Pred-Pre: 108 %
FEV6-%Pred-Pre: 76 %
FEV6-Pre: 2.81 L
FEV6FVC-%Pred-Pre: 106 %
FVC-%Pred-Pre: 71 %
FVC-Pre: 2.81 L
Pre FEV1/FVC ratio: 79 %
Pre FEV6/FVC Ratio: 100 %

## 2016-11-21 LAB — CBC
HCT: 35.9 % — ABNORMAL LOW (ref 39.0–52.0)
Hemoglobin: 12.2 g/dL — ABNORMAL LOW (ref 13.0–17.0)
MCH: 29.1 pg (ref 26.0–34.0)
MCHC: 34 g/dL (ref 30.0–36.0)
MCV: 85.7 fL (ref 78.0–100.0)
PLATELETS: 149 10*3/uL — AB (ref 150–400)
RBC: 4.19 MIL/uL — AB (ref 4.22–5.81)
RDW: 13.7 % (ref 11.5–15.5)
WBC: 8.5 10*3/uL (ref 4.0–10.5)

## 2016-11-21 LAB — PROTIME-INR
INR: 1.21
PROTHROMBIN TIME: 15.4 s — AB (ref 11.4–15.2)

## 2016-11-21 LAB — SURGICAL PCR SCREEN
MRSA, PCR: NEGATIVE
Staphylococcus aureus: NEGATIVE

## 2016-11-21 LAB — HEMOGLOBIN A1C
HEMOGLOBIN A1C: 6.7 % — AB (ref 4.8–5.6)
MEAN PLASMA GLUCOSE: 146 mg/dL

## 2016-11-21 SURGERY — LEFT HEART CATH AND CORONARY ANGIOGRAPHY
Anesthesia: LOCAL

## 2016-11-21 MED ORDER — ATORVASTATIN CALCIUM 80 MG PO TABS
80.0000 mg | ORAL_TABLET | Freq: Every day | ORAL | Status: DC
  Administered 2016-11-21 – 2016-11-23 (×3): 80 mg via ORAL
  Filled 2016-11-21 (×3): qty 1

## 2016-11-21 MED ORDER — ISOSORBIDE MONONITRATE ER 30 MG PO TB24
30.0000 mg | ORAL_TABLET | Freq: Every day | ORAL | Status: DC
  Administered 2016-11-21 – 2016-11-23 (×3): 30 mg via ORAL
  Filled 2016-11-21 (×3): qty 1

## 2016-11-21 MED ORDER — SODIUM CHLORIDE 0.9% FLUSH
3.0000 mL | Freq: Two times a day (BID) | INTRAVENOUS | Status: DC
  Administered 2016-11-22 – 2016-11-23 (×2): 3 mL via INTRAVENOUS

## 2016-11-21 MED ORDER — SODIUM CHLORIDE 0.9 % IV SOLN: 250.0000 mL | INTRAVENOUS | Status: DC | PRN

## 2016-11-21 MED ORDER — IOPAMIDOL (ISOVUE-370) INJECTION 76%
INTRAVENOUS | Status: AC
  Filled 2016-11-21: qty 100

## 2016-11-21 MED ORDER — MIDAZOLAM HCL 2 MG/2ML IJ SOLN
INTRAMUSCULAR | Status: AC
  Filled 2016-11-21: qty 2

## 2016-11-21 MED ORDER — HEPARIN (PORCINE) IN NACL 100-0.45 UNIT/ML-% IJ SOLN
1200.0000 [IU]/h | INTRAMUSCULAR | Status: DC
  Administered 2016-11-21: 1050 [IU]/h via INTRAVENOUS
  Administered 2016-11-22: 1200 [IU]/h via INTRAVENOUS
  Filled 2016-11-21 (×3): qty 250

## 2016-11-21 MED ORDER — HEPARIN SODIUM (PORCINE) 1000 UNIT/ML IJ SOLN
INTRAMUSCULAR | Status: DC | PRN
  Administered 2016-11-21: 4000 [IU] via INTRAVENOUS

## 2016-11-21 MED ORDER — IOPAMIDOL (ISOVUE-370) INJECTION 76%
INTRAVENOUS | Status: DC | PRN
Start: 2016-11-21 — End: 2016-11-21
  Administered 2016-11-21: 130 mL via INTRA_ARTERIAL

## 2016-11-21 MED ORDER — HEPARIN SODIUM (PORCINE) 1000 UNIT/ML IJ SOLN
INTRAMUSCULAR | Status: AC
  Filled 2016-11-21: qty 1

## 2016-11-21 MED ORDER — MIDAZOLAM HCL 2 MG/2ML IJ SOLN
INTRAMUSCULAR | Status: DC | PRN
  Administered 2016-11-21: 2 mg via INTRAVENOUS

## 2016-11-21 MED ORDER — HEPARIN (PORCINE) IN NACL 2-0.9 UNIT/ML-% IJ SOLN
INTRAMUSCULAR | Status: AC
  Filled 2016-11-21: qty 500

## 2016-11-21 MED ORDER — METOPROLOL TARTRATE 25 MG PO TABS: 25.0000 mg | ORAL_TABLET | Freq: Two times a day (BID) | ORAL | Status: DC

## 2016-11-21 MED ORDER — FENTANYL CITRATE (PF) 100 MCG/2ML IJ SOLN
INTRAMUSCULAR | Status: DC | PRN
  Administered 2016-11-21: 50 ug via INTRAVENOUS

## 2016-11-21 MED ORDER — LIDOCAINE HCL (PF) 1 % IJ SOLN
INTRAMUSCULAR | Status: DC | PRN
  Administered 2016-11-21: 2 mL

## 2016-11-21 MED ORDER — VERAPAMIL HCL 2.5 MG/ML IV SOLN
INTRAVENOUS | Status: AC
  Filled 2016-11-21: qty 2

## 2016-11-21 MED ORDER — FENTANYL CITRATE (PF) 100 MCG/2ML IJ SOLN
INTRAMUSCULAR | Status: AC
Start: 2016-11-21 — End: 2016-11-21
  Filled 2016-11-21: qty 2

## 2016-11-21 MED ORDER — ASPIRIN 81 MG PO CHEW: 81.0000 mg | CHEWABLE_TABLET | Freq: Every day | ORAL | Status: DC

## 2016-11-21 MED ORDER — METOPROLOL TARTRATE 12.5 MG HALF TABLET
12.5000 mg | ORAL_TABLET | Freq: Two times a day (BID) | ORAL | Status: DC
  Administered 2016-11-21: 12.5 mg via ORAL
  Filled 2016-11-21: qty 1

## 2016-11-21 MED ORDER — ONDANSETRON HCL 4 MG/2ML IJ SOLN: 4.0000 mg | Freq: Four times a day (QID) | INTRAMUSCULAR | Status: DC | PRN

## 2016-11-21 MED ORDER — SODIUM CHLORIDE 0.9 % IV SOLN
INTRAVENOUS | Status: DC
  Administered 2016-11-21: 125 mL via INTRAVENOUS
  Administered 2016-11-21: 13:00:00 via INTRAVENOUS

## 2016-11-21 MED ORDER — VERAPAMIL HCL 2.5 MG/ML IV SOLN
INTRAVENOUS | Status: DC | PRN
  Administered 2016-11-21: 10 mL via INTRA_ARTERIAL

## 2016-11-21 MED ORDER — IOPAMIDOL (ISOVUE-370) INJECTION 76%
INTRAVENOUS | Status: AC
  Filled 2016-11-21: qty 50

## 2016-11-21 MED ORDER — METOPROLOL TARTRATE 25 MG PO TABS
25.0000 mg | ORAL_TABLET | Freq: Two times a day (BID) | ORAL | Status: DC
  Administered 2016-11-21 – 2016-11-23 (×5): 25 mg via ORAL
  Filled 2016-11-21 (×6): qty 1

## 2016-11-21 MED ORDER — HEPARIN (PORCINE) IN NACL 2-0.9 UNIT/ML-% IJ SOLN
INTRAMUSCULAR | Status: AC | PRN
  Administered 2016-11-21: 1000 mL

## 2016-11-21 MED ORDER — SODIUM CHLORIDE 0.9% FLUSH: 3.0000 mL | INTRAVENOUS | Status: DC | PRN

## 2016-11-21 MED ORDER — ACETAMINOPHEN 325 MG PO TABS: 650.0000 mg | ORAL_TABLET | ORAL | Status: DC | PRN

## 2016-11-21 SURGICAL SUPPLY — 11 items

## 2016-11-21 NOTE — Progress Notes (Signed)
Progress Note  Patient Name: Jesus Curtis Date of Encounter: 11/21/2016  Primary Cardiologist: New to [facility] - Dr. Delton See  Subjective   He denies any recurrent chest pain or dyspnea. Is anxious to go home.   Inpatient Medications    Scheduled Meds: . aspirin EC  81 mg Oral Daily  . heparin  5,000 Units Subcutaneous Q8H  . sodium chloride flush  3 mL Intravenous Q12H  . sodium chloride flush  3 mL Intravenous Q12H  . sodium chloride flush  3 mL Intravenous Q12H   Continuous Infusions: . sodium chloride    . sodium chloride    . sodium chloride 1 mL/kg/hr (11/21/16 0708)   PRN Meds: sodium chloride, sodium chloride, acetaminophen, ondansetron (ZOFRAN) IV, sodium chloride flush, sodium chloride flush   Vital Signs    Vitals:   11/20/16 1958 11/21/16 0305 11/21/16 0309 11/21/16 0608  BP: (!) 153/95  (!) 170/79 (!) 157/93  Pulse: 91  88   Resp: (!) 22  16   Temp: 100 F (37.8 C)  99.5 F (37.5 C)   TempSrc: Oral  Oral   SpO2: 99%     Weight:  168 lb 8 oz (76.4 kg)    Height:       No intake or output data in the 24 hours ending 11/21/16 0920 Filed Weights   11/19/16 2059 11/20/16 0455 11/21/16 0305  Weight: 167 lb 6.4 oz (75.9 kg) 167 lb 6.4 oz (75.9 kg) 168 lb 8 oz (76.4 kg)    Telemetry    NSR, HR in 60's to 80's.  - Personally Reviewed  ECG    NSR, HR 78, with TWI along V2 and V3.  - Personally Reviewed  Physical Exam   General: Well developed, elderly Caucasian male appearing in no acute distress. Head: Normocephalic, atraumatic.  Neck: Supple without bruits, JVD not elevated. Lungs:  Resp regular and unlabored, CTA without wheezing or rales. Heart: RRR, S1, S2, no S3, S4, or murmur; no rub. Abdomen: Soft, non-tender, non-distended with normoactive bowel sounds. No hepatomegaly. No rebound/guarding. No obvious abdominal masses. Extremities: No clubbing, cyanosis, or lower extremity edema. Distal pedal pulses are 2+ bilaterally. Neuro: Alert and  oriented X 3. Moves all extremities spontaneously. Psych: Normal affect.  Labs    Chemistry Recent Labs Lab 11/19/16 2225 11/20/16 0321 11/20/16 0917 11/21/16 0250  NA 132* 135  --  134*  K 4.8 4.0  --  4.1  CL 100* 104  --  105  CO2 25 23  --  22  GLUCOSE 138* 109*  --  102*  BUN 15 15  --  15  CREATININE 1.00 0.98  --  0.92  CALCIUM 8.9 8.8*  --  8.5*  PROT 6.6  --  6.1*  --   ALBUMIN 3.3*  --  3.0*  --   AST 48*  --  48*  --   ALT 47  --  47  --   ALKPHOS 58  --  55  --   BILITOT 0.7  --  0.6  --   GFRNONAA >60 >60  --  >60  GFRAA >60 >60  --  >60  ANIONGAP 7 8  --  7     Hematology Recent Labs Lab 11/19/16 2225 11/20/16 0321 11/21/16 0250  WBC 8.9 8.6 8.5  RBC 4.48 4.41 4.19*  HGB 13.1 12.9* 12.2*  HCT 38.3* 38.2* 35.9*  MCV 85.5 86.6 85.7  MCH 29.2 29.3 29.1  MCHC 34.2 33.8  34.0  RDW 13.4 13.9 13.7  PLT 139* 138* 149*    Cardiac Enzymes Recent Labs Lab 11/19/16 2225 11/20/16 0321 11/20/16 0917  TROPONINI 0.03* <0.03 <0.03   No results for input(s): TROPIPOC in the last 168 hours.   BNP Recent Labs Lab 11/19/16 2225  BNP 15.5     DDimer No results for input(s): DDIMER in the last 168 hours.   Radiology    X-ray Chest Pa And Lateral  Result Date: 11/19/2016 CLINICAL DATA:  Syncopal episode tonight. EXAM: CHEST  2 VIEW COMPARISON:  None. FINDINGS: Borderline enlarged cardiac silhouette. Linear density at both lung bases. Mild diffuse peribronchial thickening and mild flattening of the hemidiaphragms on the lateral view. Thoracic spine degenerative changes. IMPRESSION: 1. Mild linear atelectasis or scarring at both lung bases. 2. Mild changes of COPD and chronic bronchitis. Electronically Signed   By: Beckie SaltsSteven  Reid M.D.   On: 11/19/2016 23:19   Dg Abd 1 View  Result Date: 11/20/2016 CLINICAL DATA:  Nausea only after eating and vomiting thick mucus-like material. Syncope yesterday for 15 minutes. No reported abd hx. EXAM: ABDOMEN - 1 VIEW  COMPARISON:  None. FINDINGS: Visualized bowel gas pattern is nonobstructive. Upper abdomen is excluded on the 2 provided supine images. No evidence of soft tissue mass or abnormal fluid collection. No evidence of free intraperitoneal air. Calcifications within the pelvis are most likely vascular phleboliths. No acute or suspicious osseous finding. IMPRESSION: No acute findings.  Nonobstructive bowel gas pattern. Electronically Signed   By: Bary RichardStan  Maynard M.D.   On: 11/20/2016 16:38  Cardiac Studies   Nm Myocar Multi W/spect W/wall Motion / Ef: Result Date: 11/20/2016  There was no ST segment deviation noted during stress.  T wave inversion was noted during stress in the V4 leads, and returning to baseline after less than 1 min of recovery.  Defect 1: There is a large defect of mild severity present in the basal inferoseptal, basal inferior, basal inferolateral, mid inferoseptal, mid inferior, mid inferolateral and apical inferior location.  Findings consistent with ischemia.  This is an intermediate risk study.  The left ventricular ejection fraction is normal (55-65%).  This study is abnormal with a large area, mild severity reversible defect in the basal and mid inferoseptal, inferior and inferolateral and apical inferior walls. (SDS =7). TID 1.25.   Patient Profile     76 y.o. male with no significant PMH who [facility] for evaluation of chest pain and elevated troponin.   Assessment & Plan    1. Elevated Troponin/ Abnormal Stress Test - reported troponin was elevated at OSH, cyclic values have been negative since [facility]. Lipid Panel shows total cholesterol of 104, HDL 10, and LDL 64. Hgb A1c elevated to 6.7. - NST on 11/20/2016 showed a large area of mild severity with a reversible defect in the basal and mid inferoseptal, inferior and inferolateral and apical inferior walls. (SDS =7).TID 1.25. - with his abnormal stress  test, a cardiac catheterization has been recommended for definitive evaluation. The patient understands that risks include but are not limited to stroke (1 in 1000), death (1 in 1000), kidney failure [usually temporary] (1 in 500), bleeding (1 in 200), allergic reaction [possibly serious] (1 in 200). Scheduled for later this afternoon with Dr. Tresa EndoKelly.   2. SVT - noted to have an episode of SVT earlier this admission with HR in the 140's.  - will add  Lopressor 12.5mg  BID to his medication regimen.   3. Nausea and vomiting -  Patient's symptoms are consistent with gastroenteritis. Mildly hyponatremic with stable K+ and creatinine. Lipase normal.  - now resolved.   4. HTN - BP has been variable at 113/69 - 170/95 in the past 24 hours.  - start low-dose BB as above.   5. Elevated Hgb A1c  - at 6.7 this admission. Will need to follow-up with his PCP for further evaluation and treatment.   Signed, Ellsworth LennoxBrittany M Strader , PA-C 9:20 AM 11/21/2016 Pager: [phone]   The patient was seen, examined and discussed with Randall AnBrittany Strader, PA-C and I agree with the above.    76 year old male with chest pain, nausea, vomiting, syncope, abnormal stress test, schedule for a cath today. If no significant CAD we will plan for a discharge later today. Add metoprolol 25 mg po BID for HTN and SVT, add losartan 25 mg po daily if additional BP control needed.  Tobias AlexanderKatarina Hildagard Sobecki, MD 11/21/2016

## 2016-11-21 NOTE — Progress Notes (Signed)
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 Ativan [Lorazepam] Other (See Comments)    "goes wild"    Patient Measurements: Height: 5\' 8"  (172.7 cm) Weight: 168 lb 8 oz (76.4 kg) IBW/kg (Calculated) : 68.4 Heparin Dosing Weight:  76.4 kg  Vital Signs: Temp: 99.2 F (37.3 C) (08/03 1324) Temp Source: Oral (08/03 1324) BP: 149/78 (08/03 1324) Pulse Rate: 85 (08/03 1324)  Labs:  Recent Labs  11/19/16 2225 11/20/16 0321 11/20/16 0917 11/21/16 0250  HGB 13.1 12.9*  --  12.2*  HCT 38.3* 38.2*  --  35.9*  PLT 139* 138*  --  149*  APTT 35 38*  --   --   LABPROT 15.3* 15.7*  --  15.4*  INR 1.20 1.24  --  1.21  CREATININE 1.00 0.98  --  0.92  TROPONINI 0.03* <0.03 <0.03  --     Estimated Creatinine Clearance: 68.2 mL/min (by C-G formula based on SCr of 0.92 mg/dL).   Medical History: History reviewed. No pertinent past medical history.  Assessment: CC/HPI: [facility], cyclic values have been negative since [facility]. 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   Goal of Therapy:  Heparin level 0.3-0.7 units/ml Monitor platelets by anticoagulation protocol: Yes   Plan:  CVTS consult Start IV heparin 10 hrs after sheath out (1155) at 1050 units/hr (at 2200) Daily HL and CBC   Jesus Curtis, PharmD, BCPS Clinical Staff Pharmacist Pager [phone]  Jesus Curtis, Jesus Curtis 11/21/2016,2:02 PM

## 2016-11-21 NOTE — Interval H&P Note (Signed)
Cath Lab Visit (complete for each Cath Lab visit)  Clinical Evaluation Leading to the Procedure:   ACS: No.  Non-ACS:    Anginal Classification: CCS II  Anti-ischemic medical therapy: No Therapy  Non-Invasive Test Results: Intermediate-risk stress test findings: cardiac mortality 1-3%/year  Prior CABG: No previous CABG      History and Physical Interval Note:  11/21/2016 11:17 AM  Jesus FerrettiJohn Curtis  has presented today for surgery, with the diagnosis of chest pain  The various methods of treatment have been discussed with the patient and family. After consideration of risks, benefits and other options for treatment, the patient has consented to  Procedure(s): LEFT HEART CATH AND CORONARY ANGIOGRAPHY (N/A) as a surgical intervention .  The patient's history has been reviewed, patient examined, no change in status, stable for surgery.  I have reviewed the patient's chart and labs.  Questions were answered to the patient's satisfaction.     Nicki Guadalajarahomas Creedon Danielski

## 2016-11-21 NOTE — CV Procedure (Signed)
2D echo attempted but patient went to cath lab 

## 2016-11-21 NOTE — Care Management Obs Status (Signed)
MEDICARE OBSERVATION STATUS NOTIFICATION   Patient Details  Name: Jesus FerrettiJohn Curtis MRN: [id number] Date of Birth: 06/30/1940   Medicare Observation Status Notification Given:  Yes    Lawerance Sabalebbie Charlot Gouin, RN 11/21/2016, 9:07 AM

## 2016-11-21 NOTE — Consult Note (Signed)
[address]       [place] [address]             [phone]        Jesus Curtis [facility] Medical Record #[id number] Date of Birth: December 20, 1940  Referring: No ref. provider found Primary Care: No primary care provider on file.  Chief Complaint:  N/V, shoulder pain  History of Present Illness:      This is a 76 year old male patient with a past medical history of hypertension who was admitted after transfer from [facility] for an elevated troponin. He states that he had shoulder pain last week began feeling cold. He then developed nausea and vomiting which progressed over the weekend. He did become short of breath with activity at this time. He states that he has not had much to eat or drink since then. On 11/17/2016 while at work he felt poorly and had a near syncopal episode. He did not remember much before or after the event but it was witnessed that he did not lose consciousness and there were no injuries. He denies chest pain, shortness of breath, and diaphoresis. He is currently on no medications at home and only takes a multivitamin. He does have a family history for myocardial infarction in his mother, brother, and father. He does have a history of tobacco abuse 1 pack per week about 50 years ago. He states that he has a very active day to day lifestyle. He is currently pain free. We are consulted for possible surgical revascularization.   Current Activity/ Functional Status: Patient was independent with mobility/ambulation, transfers, ADL's, IADL's.   Zubrod Score: At the time of surgery this patient's most appropriate activity status/level should be described as: []     0    Normal activity, no symptoms [x]     1    Restricted in physical strenuous activity but ambulatory, able to do out light work []     2    Ambulatory and capable of self care, unable to do work activities, up and about                 more than 50%  Of the time                              []     3    Only limited self care, in bed greater than 50% of waking hours []     4    Completely disabled, no self care, confined to bed or chair []     5    Moribund  History reviewed. No pertinent past medical history.  Past Surgical History:  Procedure Laterality Date  . LEFT HEART CATH AND CORONARY ANGIOGRAPHY N/A 11/21/2016   Procedure: LEFT HEART CATH AND CORONARY ANGIOGRAPHY;  Surgeon: Lennette Bihari, MD;  Location: [facility] INVASIVE CV LAB;  Service: Cardiovascular;  Laterality: N/A;    History  Smoking Status  . Never Smoker  Smokeless Tobacco  . Never Used    History  Alcohol use Not on file    Social History   Social History  . Marital status: Unknown    Spouse name: N/A  . Number of children: N/A  . Years of education: N/A   Occupational History  . Not on file.   Social History Main Topics  . Smoking status: Never Smoker  . Smokeless tobacco: Never Used  . Alcohol use Not  on file  . Drug use: Unknown  . Sexual activity: Not on file   Other Topics Concern  . Not on file   Social History Narrative  . No narrative on file    Allergies  Allergen Reactions  . Ativan [Lorazepam] Other (See Comments)    "goes wild"    Current Facility-Administered Medications  Medication Dose Route Frequency Provider Last Rate Last Dose  . 0.9 %  sodium chloride infusion  250 mL Intravenous PRN Jodelle Red, MD      . 0.9 %  sodium chloride infusion   Intravenous Continuous Lennette Bihari, MD 125 mL/hr at 11/21/16 1230    . 0.9 %  sodium chloride infusion  250 mL Intravenous PRN Lennette Bihari, MD      . acetaminophen (TYLENOL) tablet 650 mg  650 mg Oral Q4H PRN Lennette Bihari, MD      . Melene Muller ON 11/22/2016] aspirin chewable tablet 81 mg  81 mg Oral Daily Lennette Bihari, MD      . aspirin EC tablet 81 mg  81 mg Oral Daily Jodelle Red, MD   81 mg at 11/20/16 1011  . atorvastatin (LIPITOR) tablet 80 mg  80 mg Oral q1800 Lennette Bihari, MD      .  heparin ADULT infusion 100 units/mL (25000 units/270mL sodium chloride 0.45%)  1,050 Units/hr Intravenous Continuous Norva Pavlov, RPH      . isosorbide mononitrate (IMDUR) 24 hr tablet 30 mg  30 mg Oral Daily Lennette Bihari, MD   30 mg at 11/21/16 1438  . metoprolol tartrate (LOPRESSOR) tablet 25 mg  25 mg Oral BID Lennette Bihari, MD      . ondansetron [facility]) injection 4 mg  4 mg Intravenous Q6H PRN Lennette Bihari, MD      . sodium chloride flush (NS) 0.9 % injection 3 mL  3 mL Intravenous Q12H Jodelle Red, MD      . sodium chloride flush (NS) 0.9 % injection 3 mL  3 mL Intravenous Q12H Jodelle Red, MD   3 mL at 11/19/16 2237  . sodium chloride flush (NS) 0.9 % injection 3 mL  3 mL Intravenous PRN Jodelle Red, MD      . sodium chloride flush (NS) 0.9 % injection 3 mL  3 mL Intravenous Q12H Nicki Guadalajara A, MD      . sodium chloride flush (NS) 0.9 % injection 3 mL  3 mL Intravenous PRN Lennette Bihari, MD        Prescriptions Prior to Admission  Medication Sig Dispense Refill Last Dose  . acetaminophen (TYLENOL) 500 MG tablet Take 1,000 mg by mouth every 6 (six) hours as needed for mild pain.   Past Week at Unknown time  . aspirin EC 325 MG tablet Take 325 mg by mouth daily.   Past Week at Unknown time  . GINSENG PO Take 2 tablets by mouth daily.   Past Week at Unknown time  . Multiple Vitamin (ONE-A-DAY MENS PO) Take 1 tablet by mouth daily.   Past Week at Unknown time    History reviewed. No pertinent family history.   Review of Systems:  Pertinent items are noted in HPI.     Cardiac Review of Systems: Y or N  Chest Pain [ N   ]  Resting SOB [ N  ] Exertional SOB  [ Y ]  Orthopnea [  ]   Pedal Edema [ N  ]  Palpitations [ N ] Syncope  [ Y ]   Presyncope [ Y  ]  General Review of Systems: [Y] = yes [  ]=no Constitional: recent weight change [  ]; anorexia [Y  ]; fatigue [Y  ]; nausea [ Y ]; night sweats [  ]; fever [Y  ]; or chills [  ]                                                                Dental: poor dentition[  ]; Last Dentist visit:   Eye : blurred vision [  ]; diplopia [   ]; vision changes [  ];  Amaurosis fugax[  ]; Resp: cough Klaus.Mock  ];  wheezing[N  ];  hemoptysis[  ]; shortness of breath[ Y ]; paroxysmal nocturnal dyspnea[  ]; dyspnea on exertion[Y  ]; or orthopnea[  ];  GI:  gallstones[  ], vomiting[Y  ];  dysphagia[ N ]; melena[  ];  hematochezia [  ]; heartburn[  ];   Hx of  Colonoscopy[  ]; GU: kidney stones [  ]; hematuria[  ];   dysuria [  ];  nocturia[  ];  history of     obstruction [ N ]; urinary frequency [  ]             Skin: rash, swelling[  ];, hair loss[  ];  peripheral edema[ N ];  or itching[  ]; Musculosketetal: myalgias[  ];  joint swelling[  ];  joint erythema[  ];  joint pain[  ];  back pain[  ];  Heme/Lymph: bruising[  ];  bleeding[  ];  anemia[  ];  Neuro: TIA[  ];  headaches[  ];  stroke[ N ];  vertigo[  ];  seizures[  ];   paresthesias[  ];  difficulty walking[  ];  Psych:depression[N  ]; anxiety[ N ];  Endocrine: diabetes[N  ];  thyroid dysfunction[ N ];  Immunizations: Flu [  ]; Pneumococcal[  ];  Other:  Physical Exam: BP (!) 148/82   Pulse 79   Temp 99.2 F (37.3 C) (Oral)   Resp (!) 21   Ht 5\' 8"  (1.727 m)   Wt 76.4 kg (168 lb 8 oz)   SpO2 95%   BMI 25.62 kg/m    General appearance: alert, cooperative and no distress Resp: clear to auscultation bilaterally Cardio: regular rate and rhythm, S1, S2 normal, no murmur, click, rub or gallop GI: soft, non-tender; bowel sounds normal; no masses,  no organomegaly Extremities: extremities normal, atraumatic, no cyanosis or edema Neurologic: Grossly normal  Diagnostic Studies & Laboratory data:  Cardiac Cath  LM lesion, 30 %stenosed.  Ost LAD to Prox LAD lesion, 30 %stenosed.  Mid LAD lesion, 90 %stenosed.  1st Mrg lesion, 50 %stenosed.  Ost Cx to Prox Cx lesion, 70 %stenosed.  Prox Cx to Dist Cx lesion, 70  %stenosed.  Ost RCA to Prox RCA lesion, 80 %stenosed.  Mid RCA lesion, 100 %stenosed.  LV end diastolic pressure is normal.     Recent Radiology Findings:   X-ray Chest Pa And Lateral  Result Date: 11/19/2016 CLINICAL DATA:  Syncopal episode tonight. EXAM: CHEST  2 VIEW COMPARISON:  None. FINDINGS: Borderline enlarged cardiac silhouette. Linear density at both lung bases. Mild  diffuse peribronchial thickening and mild flattening of the hemidiaphragms on the lateral view. Thoracic spine degenerative changes. IMPRESSION: 1. Mild linear atelectasis or scarring at both lung bases. 2. Mild changes of COPD and chronic bronchitis. Electronically Signed   By: Beckie SaltsSteven  Reid M.D.   On: 11/19/2016 23:19   Dg Abd 1 View  Result Date: 11/20/2016 CLINICAL DATA:  Nausea only after eating and vomiting thick mucus-like material. Syncope yesterday for 15 minutes. No reported abd hx. EXAM: ABDOMEN - 1 VIEW COMPARISON:  None. FINDINGS: Visualized bowel gas pattern is nonobstructive. Upper abdomen is excluded on the 2 provided supine images. No evidence of soft tissue mass or abnormal fluid collection. No evidence of free intraperitoneal air. Calcifications within the pelvis are most likely vascular phleboliths. No acute or suspicious osseous finding. IMPRESSION: No acute findings.  Nonobstructive bowel gas pattern. Electronically Signed   By: Bary RichardStan  Maynard M.D.   On: 11/20/2016 16:38   Nm Myocar Multi W/spect W/wall Motion / Ef  Result Date: 11/20/2016  There was no ST segment deviation noted during stress.  T wave inversion was noted during stress in the V4 leads, and returning to baseline after less than 1 min of recovery.  Defect 1: There is a large defect of mild severity present in the basal inferoseptal, basal inferior, basal inferolateral, mid inferoseptal, mid inferior, mid inferolateral and apical inferior location.  Findings consistent with ischemia.  This is an intermediate risk study.  The left  ventricular ejection fraction is normal (55-65%).  This study is abnormal with a large area, mild severity reversible defect in the basal and mid inferoseptal, inferior and inferolateral and apical inferior walls. (SDS =7). TID 1.25.     I have independently reviewed the above radiologic studies.  Recent Lab Findings: Lab Results  Component Value Date   WBC 8.5 11/21/2016   HGB 12.2 (L) 11/21/2016   HCT 35.9 (L) 11/21/2016   PLT 149 (L) 11/21/2016   GLUCOSE 102 (H) 11/21/2016   CHOL 104 11/20/2016   TRIG 151 (H) 11/20/2016   HDL 10 (L) 11/20/2016   LDLCALC 64 11/20/2016   ALT 47 11/20/2016   AST 48 (H) 11/20/2016   NA 134 (L) 11/21/2016   K 4.1 11/21/2016   CL 105 11/21/2016   CREATININE 0.92 11/21/2016   BUN 15 11/21/2016   CO2 22 11/21/2016   TSH 1.637 11/19/2016   INR 1.21 11/21/2016   HGBA1C 6.7 (H) 11/19/2016      Assessment / Plan:   Tentative CABG for Monday with Dr. Donata ClayVan Trigt. Await Echocardiogram results.          I  spent 30 minutes counseling the patient face to face and 50% or more the  time was spent in counseling and coordination of care. The total time spent in the appointment was 60 minutes.    Jari Favreessa Conte, PA-C 11/21/2016 2:50 PM  Patient examined, coronary tear grams and 2-D echocardiogram and pre-CABG Dopplers and chest x-ray and pulmonary function testing all personally reviewed and counseled with patient.  Very nice 76 year old Caucasian male with hypertension and borderline diabetes presented after a syncopal episode with positive troponin. Cardiac catheterization demonstrates severe three-vessel coronary disease with a left dominant coronary circulation and high-grade 90% stenosis of the LAD, OM 1, distal circumflex, and chronic occlusion of a small nondominant RCA. Echo shows normal LV EF without significant valvular disease. LVEDP 12 mmHg. The patient was recommended for surgical coronary revascularization by his cardiologist.  I have discussed  the procedure of CABG with the patient has family. I have discussed the benefits, expected recovery, and the potential risks of stroke bleeding blood transfusion postoperatively pulmonary problems including pleural effusion, postoperative infection, and death. He demonstrates his understanding of these issues and agrees to proceed with surgery.  Kathlee NationsPeter Van trigt M.D.

## 2016-11-21 NOTE — H&P (View-Only) (Signed)
Progress Note  Patient Name: Jesus Curtis Date of Encounter: 11/21/2016  Primary Cardiologist: New to [facility] - Dr. Delton See  Subjective   He denies any recurrent chest pain or dyspnea. Is anxious to go home.   Inpatient Medications    Scheduled Meds: . aspirin EC  81 mg Oral Daily  . heparin  5,000 Units Subcutaneous Q8H  . sodium chloride flush  3 mL Intravenous Q12H  . sodium chloride flush  3 mL Intravenous Q12H  . sodium chloride flush  3 mL Intravenous Q12H   Continuous Infusions: . sodium chloride    . sodium chloride    . sodium chloride 1 mL/kg/hr (11/21/16 0708)   PRN Meds: sodium chloride, sodium chloride, acetaminophen, ondansetron (ZOFRAN) IV, sodium chloride flush, sodium chloride flush   Vital Signs    Vitals:   11/20/16 1958 11/21/16 0305 11/21/16 0309 11/21/16 0608  BP: (!) 153/95  (!) 170/79 (!) 157/93  Pulse: 91  88   Resp: (!) 22  16   Temp: 100 F (37.8 C)  99.5 F (37.5 C)   TempSrc: Oral  Oral   SpO2: 99%     Weight:  168 lb 8 oz (76.4 kg)    Height:       No intake or output data in the 24 hours ending 11/21/16 0920 Filed Weights   11/19/16 2059 11/20/16 0455 11/21/16 0305  Weight: 167 lb 6.4 oz (75.9 kg) 167 lb 6.4 oz (75.9 kg) 168 lb 8 oz (76.4 kg)    Telemetry    NSR, HR in 60's to 80's.  - Personally Reviewed  ECG    NSR, HR 78, with TWI along V2 and V3.  - Personally Reviewed  Physical Exam   General: Well developed, elderly Caucasian male appearing in no acute distress. Head: Normocephalic, atraumatic.  Neck: Supple without bruits, JVD not elevated. Lungs:  Resp regular and unlabored, CTA without wheezing or rales. Heart: RRR, S1, S2, no S3, S4, or murmur; no rub. Abdomen: Soft, non-tender, non-distended with normoactive bowel sounds. No hepatomegaly. No rebound/guarding. No obvious abdominal masses. Extremities: No clubbing, cyanosis, or lower extremity edema. Distal pedal pulses are 2+ bilaterally. Neuro: Alert and  oriented X 3. Moves all extremities spontaneously. Psych: Normal affect.  Labs    Chemistry Recent Labs Lab 11/19/16 2225 11/20/16 0321 11/20/16 0917 11/21/16 0250  NA 132* 135  --  134*  K 4.8 4.0  --  4.1  CL 100* 104  --  105  CO2 25 23  --  22  GLUCOSE 138* 109*  --  102*  BUN 15 15  --  15  CREATININE 1.00 0.98  --  0.92  CALCIUM 8.9 8.8*  --  8.5*  PROT 6.6  --  6.1*  --   ALBUMIN 3.3*  --  3.0*  --   AST 48*  --  48*  --   ALT 47  --  47  --   ALKPHOS 58  --  55  --   BILITOT 0.7  --  0.6  --   GFRNONAA >60 >60  --  >60  GFRAA >60 >60  --  >60  ANIONGAP 7 8  --  7     Hematology Recent Labs Lab 11/19/16 2225 11/20/16 0321 11/21/16 0250  WBC 8.9 8.6 8.5  RBC 4.48 4.41 4.19*  HGB 13.1 12.9* 12.2*  HCT 38.3* 38.2* 35.9*  MCV 85.5 86.6 85.7  MCH 29.2 29.3 29.1  MCHC 34.2 33.8  34.0  RDW 13.4 13.9 13.7  PLT 139* 138* 149*    Cardiac Enzymes Recent Labs Lab 11/19/16 2225 11/20/16 0321 11/20/16 0917  TROPONINI 0.03* <0.03 <0.03   No results for input(s): TROPIPOC in the last 168 hours.   BNP Recent Labs Lab 11/19/16 2225  BNP 15.5     DDimer No results for input(s): DDIMER in the last 168 hours.   Radiology    X-ray Chest Pa And Lateral  Result Date: 11/19/2016 CLINICAL DATA:  Syncopal episode tonight. EXAM: CHEST  2 VIEW COMPARISON:  None. FINDINGS: Borderline enlarged cardiac silhouette. Linear density at both lung bases. Mild diffuse peribronchial thickening and mild flattening of the hemidiaphragms on the lateral view. Thoracic spine degenerative changes. IMPRESSION: 1. Mild linear atelectasis or scarring at both lung bases. 2. Mild changes of COPD and chronic bronchitis. Electronically Signed   By: Beckie SaltsSteven  Reid M.D.   On: 11/19/2016 23:19   Dg Abd 1 View  Result Date: 11/20/2016 CLINICAL DATA:  Nausea only after eating and vomiting thick mucus-like material. Syncope yesterday for 15 minutes. No reported abd hx. EXAM: ABDOMEN - 1 VIEW  COMPARISON:  None. FINDINGS: Visualized bowel gas pattern is nonobstructive. Upper abdomen is excluded on the 2 provided supine images. No evidence of soft tissue mass or abnormal fluid collection. No evidence of free intraperitoneal air. Calcifications within the pelvis are most likely vascular phleboliths. No acute or suspicious osseous finding. IMPRESSION: No acute findings.  Nonobstructive bowel gas pattern. Electronically Signed   By: Bary RichardStan  Maynard M.D.   On: 11/20/2016 16:38  Cardiac Studies   Nm Myocar Multi W/spect W/wall Motion / Ef: Result Date: 11/20/2016  There was no ST segment deviation noted during stress.  T wave inversion was noted during stress in the V4 leads, and returning to baseline after less than 1 min of recovery.  Defect 1: There is a large defect of mild severity present in the basal inferoseptal, basal inferior, basal inferolateral, mid inferoseptal, mid inferior, mid inferolateral and apical inferior location.  Findings consistent with ischemia.  This is an intermediate risk study.  The left ventricular ejection fraction is normal (55-65%).  This study is abnormal with a large area, mild severity reversible defect in the basal and mid inferoseptal, inferior and inferolateral and apical inferior walls. (SDS =7). TID 1.25.   Patient Profile     76 y.o. male with no significant PMH who [facility] of chest pain and elevated troponin.   Assessment & Plan    1. Elevated Troponin/ Abnormal Stress Test - reported troponin was elevated at OSH, cyclic values have been negative since [facility]. Lipid Panel shows total cholesterol of 104, HDL 10, and LDL 64. Hgb A1c elevated to 6.7. - NST on 11/20/2016 showed a large area of mild severity with a reversible defect in the basal and mid inferoseptal, inferior and inferolateral and apical inferior walls. (SDS =7).TID 1.25. - with his abnormal stress  test, a cardiac catheterization has been recommended for definitive evaluation. The patient understands that risks include but are not limited to stroke (1 in 1000), death (1 in 1000), kidney failure [usually temporary] (1 in 500), bleeding (1 in 200), allergic reaction [possibly serious] (1 in 200). Scheduled for later this afternoon with Dr. Tresa EndoKelly.   2. SVT - noted to have an episode of SVT earlier this admission with HR in the 140's.  - will add  Lopressor 12.5mg  BID to his medication regimen.   3. Nausea and vomiting -  Patient's symptoms are consistent with gastroenteritis. Mildly hyponatremic with stable K+ and creatinine. Lipase normal.  - now resolved.   4. HTN - BP has been variable at 113/69 - 170/95 in the past 24 hours.  - start low-dose BB as above.   5. Elevated Hgb A1c  - at 6.7 this admission. Will need to follow-up with his PCP for further evaluation and treatment.   Signed, Ellsworth LennoxBrittany M Strader , PA-C 9:20 AM 11/21/2016 Pager: [phone]   The patient was seen, examined and discussed with Randall AnBrittany Strader, PA-C and I agree with the above.    76 year old male with chest pain, nausea, vomiting, syncope, abnormal stress test, schedule for a cath today. If no significant CAD we will plan for a discharge later today. Add metoprolol 25 mg po BID for HTN and SVT, add losartan 25 mg po daily if additional BP control needed.  Tobias AlexanderKatarina Joe Tanney, MD 11/21/2016

## 2016-11-22 ENCOUNTER — Inpatient Hospital Stay (HOSPITAL_COMMUNITY): Payer: Medicare Other

## 2016-11-22 DIAGNOSIS — I25110 Atherosclerotic heart disease of native coronary artery with unstable angina pectoris: Secondary | ICD-10-CM

## 2016-11-22 DIAGNOSIS — Z01810 Encounter for preprocedural cardiovascular examination: Secondary | ICD-10-CM

## 2016-11-22 DIAGNOSIS — I214 Non-ST elevation (NSTEMI) myocardial infarction: Principal | ICD-10-CM

## 2016-11-22 DIAGNOSIS — R002 Palpitations: Secondary | ICD-10-CM

## 2016-11-22 LAB — COMPREHENSIVE METABOLIC PANEL
ALT: 64 U/L — ABNORMAL HIGH (ref 17–63)
AST: 67 U/L — ABNORMAL HIGH (ref 15–41)
Albumin: 2.6 g/dL — ABNORMAL LOW (ref 3.5–5.0)
Alkaline Phosphatase: 48 U/L (ref 38–126)
Anion gap: 4 — ABNORMAL LOW (ref 5–15)
BUN: 11 mg/dL (ref 6–20)
CO2: 23 mmol/L (ref 22–32)
Calcium: 8.3 mg/dL — ABNORMAL LOW (ref 8.9–10.3)
Chloride: 108 mmol/L (ref 101–111)
Creatinine, Ser: 0.88 mg/dL (ref 0.61–1.24)
GFR calc Af Amer: >60 mL/min (ref >60)
GFR calc non Af Amer: >60 mL/min (ref >60)
Glucose, Bld: 112 mg/dL — ABNORMAL HIGH (ref 65–99)
Potassium: 4.4 mmol/L (ref 3.5–5.1)
Sodium: 135 mmol/L (ref 135–145)
Total Bilirubin: 0.5 mg/dL (ref 0.3–1.2)
Total Protein: 5.6 g/dL — ABNORMAL LOW (ref 6.5–8.1)

## 2016-11-22 LAB — PROTIME-INR
INR: 1.29
Prothrombin Time: 16.1 seconds — ABNORMAL HIGH (ref 11.4–15.2)

## 2016-11-22 LAB — CBC
HCT: 32.1 % — ABNORMAL LOW (ref 39.0–52.0)
Hemoglobin: 10.9 g/dL — ABNORMAL LOW (ref 13.0–17.0)
MCH: 29.5 pg (ref 26.0–34.0)
MCHC: 34 g/dL (ref 30.0–36.0)
MCV: 87 fL (ref 78.0–100.0)
Platelets: 166 10*3/uL (ref 150–400)
RBC: 3.69 MIL/uL — ABNORMAL LOW (ref 4.22–5.81)
RDW: 14.1 % (ref 11.5–15.5)
WBC: 11.7 10*3/uL — ABNORMAL HIGH (ref 4.0–10.5)

## 2016-11-22 LAB — ECHOCARDIOGRAM COMPLETE
AVLVOTPG: 5 mmHg
E decel time: 215 msec
EERAT: 18.1
FS: 33 % (ref 28–44)
Height: 68 in
IVS/LV PW RATIO, ED: 1.1
LA ID, A-P, ES: 42 mm
LA diam index: 2.2 cm/m2
LA vol index: 32.4 mL/m2
LAVOL: 61.8 mL
LAVOLA4C: 67.9 mL
LDCA: 2.84 cm2
LEFT ATRIUM END SYS DIAM: 42 mm
LV E/e' medial: 18.1
LV PW d: 10 mm — AB (ref 0.6–1.1)
LV TDI E'LATERAL: 6.96
LV TDI E'MEDIAL: 6.31
LV e' LATERAL: 6.96 cm/s
LVEEAVG: 18.1
LVOT VTI: 22.8 cm
LVOT peak vel: 108 cm/s
LVOTD: 19 mm
LVOTSV: 65 mL
MV Dec: 215
MV pk A vel: 149 m/s
MV pk E vel: 126 m/s
MVAP: 2.65 cm2
MVPG: 6 mmHg
P 1/2 time: 83 ms
RV LATERAL S' VELOCITY: 20.2 cm/s
RV TAPSE: 23.9 mm
RV sys press: 23 mmHg
Reg peak vel: 213 cm/s
TR max vel: 213 cm/s
WEIGHTICAEL: 2740.8 [oz_av]

## 2016-11-22 LAB — HEPARIN LEVEL (UNFRACTIONATED)
HEPARIN UNFRACTIONATED: 0.16 [IU]/mL — AB (ref 0.30–0.70)
Heparin Unfractionated: 0.33 IU/mL (ref 0.30–0.70)

## 2016-11-22 LAB — TSH: TSH: 2.389 u[IU]/mL (ref 0.350–4.500)

## 2016-11-22 NOTE — Progress Notes (Signed)
  Echocardiogram 2D Echocardiogram has been performed.  Jesus Curtis, Jesus Curtis 11/22/2016, 10:38 AM

## 2016-11-22 NOTE — Progress Notes (Signed)
Progress Note  Patient Name: Jesus Curtis Date of Encounter: 11/22/2016  Primary Cardiologist: Ellis Parents Delton See)  Subjective   Denies angina or dyspnea at rest 12 beat Run of supraventricular tachycardia on monitor, asymptomatic. Going for carotid and venous mapping ultrasound right now. To be seen by CV surgeon in a.m. with plans for bypass on Monday  Inpatient Medications    Scheduled Meds: . aspirin  81 mg Oral Daily  . aspirin EC  81 mg Oral Daily  . atorvastatin  80 mg Oral q1800  . isosorbide mononitrate  30 mg Oral Daily  . metoprolol tartrate  25 mg Oral BID  . sodium chloride flush  3 mL Intravenous Q12H  . sodium chloride flush  3 mL Intravenous Q12H  . sodium chloride flush  3 mL Intravenous Q12H   Continuous Infusions: . sodium chloride    . sodium chloride Stopped (11/22/16 0539)  . sodium chloride    . heparin 1,200 Units/hr (11/22/16 0539)   PRN Meds: sodium chloride, sodium chloride, acetaminophen, ondansetron (ZOFRAN) IV, sodium chloride flush, sodium chloride flush   Vital Signs    Vitals:   11/21/16 1438 11/21/16 2210 11/22/16 0626 11/22/16 1005  BP: (!) 148/82 134/84 133/77 138/77  Pulse: 79 86 76 83  Resp: (!) 21  15   Temp:   98.6 F (37 C)   TempSrc:   Oral   SpO2: 95%  92%   Weight:   171 lb 4.8 oz (77.7 kg)   Height:        Intake/Output Summary (Last 24 hours) at 11/22/16 1054 Last data filed at 11/22/16 0905  Gross per 24 hour  Intake           2222.9 ml  Output              200 ml  Net           2022.9 ml   Filed Weights   11/20/16 0455 11/21/16 0305 11/22/16 0626  Weight: 167 lb 6.4 oz (75.9 kg) 168 lb 8 oz (76.4 kg) 171 lb 4.8 oz (77.7 kg)    Telemetry    Normal sinus rhythm, a single 12 beat run of SVT - Personally Reviewed  ECG    Normal sinus rhythm, T-wave inversion across virtually the entire anterior precordium - Personally Reviewed  Physical Exam  Relaxed, comfortable GEN: No acute distress.   Neck: No JVD, poor  dentition Cardiac: RRR, no murmurs, rubs, or gallops.  Respiratory: Clear to auscultation bilaterally. GI: Soft, nontender, non-distended  MS: No edema; No deformity. Neuro:  Nonfocal  Psych: Normal affect   Labs    Chemistry Recent Labs Lab 11/19/16 2225 11/20/16 0321 11/20/16 0917 11/21/16 0250 11/22/16 0344  NA 132* 135  --  134* 135  K 4.8 4.0  --  4.1 4.4  CL 100* 104  --  105 108  CO2 25 23  --  22 23  GLUCOSE 138* 109*  --  102* 112*  BUN 15 15  --  15 11  CREATININE 1.00 0.98  --  0.92 0.88  CALCIUM 8.9 8.8*  --  8.5* 8.3*  PROT 6.6  --  6.1*  --  5.6*  ALBUMIN 3.3*  --  3.0*  --  2.6*  AST 48*  --  48*  --  67*  ALT 47  --  47  --  64*  ALKPHOS 58  --  55  --  48  BILITOT 0.7  --  0.6  --  0.5  GFRNONAA >60 >60  --  >60 >60  GFRAA >60 >60  --  >60 >60  ANIONGAP 7 8  --  7 4*     Hematology Recent Labs Lab 11/20/16 0321 11/21/16 0250 11/22/16 0344  WBC 8.6 8.5 11.7*  RBC 4.41 4.19* 3.69*  HGB 12.9* 12.2* 10.9*  HCT 38.2* 35.9* 32.1*  MCV 86.6 85.7 87.0  MCH 29.3 29.1 29.5  MCHC 33.8 34.0 34.0  RDW 13.9 13.7 14.1  PLT 138* 149* 166    Cardiac Enzymes Recent Labs Lab 11/19/16 2225 11/20/16 0321 11/20/16 0917  TROPONINI 0.03* <0.03 <0.03   No results for input(s): TROPIPOC in the last 168 hours.   BNP Recent Labs Lab 11/19/16 2225  BNP 15.5     DDimer No results for input(s): DDIMER in the last 168 hours.   Radiology    Dg Chest 2 View  Result Date: 11/22/2016 CLINICAL DATA:  Coronary artery disease. Preoperative examination prior to CABG. EXAM: CHEST  2 VIEW COMPARISON:  11/19/2016 radiographs FINDINGS: The cardiomediastinal silhouette is unremarkable. Mild bibasilar atelectasis/scarring again noted. There may be trace bilateral pleural effusions present. There is no evidence of airspace disease, pneumothorax, pulmonary nodule or mass. No acute bony abnormalities are identified. IMPRESSION: No significant change.  Mild basilar  atelectasis/scarring. Electronically Signed   By: Harmon PierJeffrey  Hu M.D.   On: 11/22/2016 08:39   Dg Abd 1 View  Result Date: 11/20/2016 CLINICAL DATA:  Nausea only after eating and vomiting thick mucus-like material. Syncope yesterday for 15 minutes. No reported abd hx. EXAM: ABDOMEN - 1 VIEW COMPARISON:  None. FINDINGS: Visualized bowel gas pattern is nonobstructive. Upper abdomen is excluded on the 2 provided supine images. No evidence of soft tissue mass or abnormal fluid collection. No evidence of free intraperitoneal air. Calcifications within the pelvis are most likely vascular phleboliths. No acute or suspicious osseous finding. IMPRESSION: No acute findings.  Nonobstructive bowel gas pattern. Electronically Signed   By: Bary RichardStan  Maynard M.D.   On: 11/20/2016 16:38   Nm Myocar Multi W/spect W/wall Motion / Ef  Result Date: 11/20/2016  There was no ST segment deviation noted during stress.  T wave inversion was noted during stress in the V4 leads, and returning to baseline after less than 1 min of recovery.  Defect 1: There is a large defect of mild severity present in the basal inferoseptal, basal inferior, basal inferolateral, mid inferoseptal, mid inferior, mid inferolateral and apical inferior location.  Findings consistent with ischemia.  This is an intermediate risk study.  The left ventricular ejection fraction is normal (55-65%).  This study is abnormal with a large area, mild severity reversible defect in the basal and mid inferoseptal, inferior and inferolateral and apical inferior walls. (SDS =7). TID 1.25.    Cardiac Studies  Cath 11/21/2016 Conclusion     LM lesion, 30 %stenosed.  Ost LAD to Prox LAD lesion, 30 %stenosed.  Mid LAD lesion, 90 %stenosed.  1st Mrg lesion, 50 %stenosed.  Ost Cx to Prox Cx lesion, 70 %stenosed.  Prox Cx to Dist Cx lesion, 70 %stenosed.  Ost RCA to Prox RCA lesion, 80 %stenosed.  Mid RCA lesion, 100 %stenosed.  LV end diastolic pressure is  normal.   Low-normal LV function with a global ejection fraction at 50% with mild mid to basal inferior hypocontractility.  Significant multivessel CAD with evidence for coronary calcification, smooth distal 30% left main stenosis;, 30% ostial LAD stenosis and diffuse 90%  LAD stenosis after the takeoff of a very proximal large diagonal 1 vessel; 70% ostial stenosis in the left circumflex coronary artery with 70% bifurcation stenosis of the OM1 with 50% stenosis in the proximal OM1 vessel.  The 70% stenosis prior to the bifurcation extending to 60% after the OM takeoff, and diffuse 80% proximal RCA stenosis with total occlusion of the mid RCA vessel with faint bridging collaterals to the mid RCA and faint  collateralization from the left coronary injection to the distal RCA branches.  RECOMMENDATION: Surgical consultation for CABG revascularization surgery.      Patient Profile     76 y.o. male with borderline left ventricular systolic function and multivessel coronary artery disease for bypass surgery Monday  Assessment & Plan    76 year old man with unstable angina and small non-STEMI found to have multivessel coronary artery disease and tentative schedule for bypass surgery Monday. His lipid profile is actually remarkable for a very low total cholesterol and LDL cholesterol of only 64, notes HDL is only 10.  Signed, Thurmon FairMihai Jacere Pangborn, MD  11/22/2016, 10:54 AM

## 2016-11-22 NOTE — Progress Notes (Signed)
ANTICOAGULATION CONSULT NOTE - Follow Up Consult  Pharmacy Consult for Heparin  Indication: Multi-vessel disease awaiting CABG  Allergies  Allergen Reactions  . Ativan [Lorazepam] Other (See Comments)    "goes wild"    Patient Measurements: Height: 5\' 8"  (172.7 cm) Weight: 171 lb 4.8 oz (77.7 kg) IBW/kg (Calculated) : 68.4  Vital Signs: Temp: 99 F (37.2 C) (08/04 1300) Temp Source: Oral (08/04 1300) BP: 145/77 (08/04 1300) Pulse Rate: 79 (08/04 1300)  Labs:  Recent Labs  11/19/16 2225 11/20/16 0321 11/20/16 0917 11/21/16 0250 11/22/16 0344 11/22/16 1238  HGB 13.1 12.9*  --  12.2* 10.9*  --   HCT 38.3* 38.2*  --  35.9* 32.1*  --   PLT 139* 138*  --  149* 166  --   APTT 35 38*  --   --   --   --   LABPROT 15.3* 15.7*  --  15.4* 16.1*  --   INR 1.20 1.24  --  1.21 1.29  --   HEPARINUNFRC  --   --   --   --  0.16* 0.33  CREATININE 1.00 0.98  --  0.92 0.88  --   TROPONINI 0.03* <0.03 <0.03  --   --   --     Estimated Creatinine Clearance: 71.3 mL/min (by C-G formula based on SCr of 0.88 mg/dL).   Assessment: On heparin s/p cath for multi-vessel disease, tentative CABG on Monday. Heparin level therapeutic at 0.33 after rate increase. Hgb decreasing, now 10.9. Pltc up to 166. No bleeding noted.   Goal of Therapy:  Heparin level 0.3-0.7 units/ml Monitor platelets by anticoagulation protocol: Yes   Plan:  Continue heparin gtt at 1200 units/hr Daily heparin level and CBC Monitor for s/sx of bleeding   Erin N. Zigmund Danieleja, PharmD PGY1 Pharmacy Resident Pager: [phone]  11/22/2016,3:05 PM

## 2016-11-22 NOTE — Progress Notes (Signed)
PHASE I CARDIAC REHAB 1300-1325  Pt just returned to bed from ambulating independently.  Pt declined ambulation  Preop [facility] teaching performed. Pt given Heart Surgery booklet and handout. Pt and family verbalized understanding.

## 2016-11-22 NOTE — Progress Notes (Signed)
Pre-op Cardiac Surgery  Carotid Findings:  Findings suggest 1-39% internal carotid artery stenosis bilaterally. Vertebral arteries are patent with antegrade flow.   Upper Extremity Right Left  Brachial Pressures 121-Triphasic Triphasic  Radial Waveforms Triphasic Triphasic  Ulnar Waveforms Triphasic Triphasic  Palmar Arch (Allen's Test) Signal obliterates with radial compression, is unaffected with ulnar compression. Signal obliterates with radial compression, is unaffected with ulnar compression.   Lower  Extremity Right Left  Dorsalis Pedis Triphasic Triphasic  Posterior Tibial Triphasic Triphasic    Findings:   Pedal waveforms are within normal limits bilaterally at rest.  11/22/2016 12:44 PM Gertie FeyMichelle Kaan Tosh, BS, RVT, RDCS, RDMS

## 2016-11-22 NOTE — Progress Notes (Signed)
ANTICOAGULATION CONSULT NOTE - Follow Up Consult  Pharmacy Consult for Heparin  Indication: Multi-vessel disease awaiting CABG  Allergies  Allergen Reactions  . Ativan [Lorazepam] Other (See Comments)    "goes wild"    Patient Measurements: Height: 5\' 8"  (172.7 cm) Weight: 168 lb 8 oz (76.4 kg) IBW/kg (Calculated) : 68.4  Vital Signs: BP: 134/84 (08/03 2210) Pulse Rate: 86 (08/03 2210)  Labs:  Recent Labs  11/19/16 2225 11/20/16 0321 11/20/16 0917 11/21/16 0250 11/22/16 0344  HGB 13.1 12.9*  --  12.2* 10.9*  HCT 38.3* 38.2*  --  35.9* 32.1*  PLT 139* 138*  --  149* 166  APTT 35 38*  --   --   --   LABPROT 15.3* 15.7*  --  15.4*  --   INR 1.20 1.24  --  1.21  --   HEPARINUNFRC  --   --   --   --  0.16*  CREATININE 1.00 0.98  --  0.92 0.88  TROPONINI 0.03* <0.03 <0.03  --   --     Estimated Creatinine Clearance: 71.3 mL/min (by C-G formula based on SCr of 0.88 mg/dL).   Assessment: On heparin s/p cath for multi-vessel disease, tentative CABG on Monday, heparin level sub-therapeutic this AM, no issues per RN.   Goal of Therapy:  Heparin level 0.3-0.7 units/ml Monitor platelets by anticoagulation protocol: Yes   Plan:  -Inc heparin to 1200 units/hr -1300 HL  Abran DukeLedford, Lizmary Nader 11/22/2016,5:32 AM

## 2016-11-23 LAB — VAS US DOPPLER PRE CABG
LEFT ECA DIAS: -16 cm/s
LEFT VERTEBRAL DIAS: 20 cm/s
Left CCA dist dias: -25 cm/s
Left CCA dist sys: -109 cm/s
Left CCA prox dias: 18 cm/s
Left CCA prox sys: 107 cm/s
Left ICA dist dias: -39 cm/s
Left ICA dist sys: -131 cm/s
Left ICA prox dias: -37 cm/s
Left ICA prox sys: -134 cm/s
RIGHT ECA DIAS: 0 cm/s
RIGHT VERTEBRAL DIAS: 18 cm/s
Right CCA prox dias: 15 cm/s
Right CCA prox sys: 63 cm/s
Right cca dist sys: -73 cm/s

## 2016-11-23 LAB — CBC
HCT: 31 % — ABNORMAL LOW (ref 39.0–52.0)
Hemoglobin: 10.6 g/dL — ABNORMAL LOW (ref 13.0–17.0)
MCH: 29.4 pg (ref 26.0–34.0)
MCHC: 34.2 g/dL (ref 30.0–36.0)
MCV: 85.9 fL (ref 78.0–100.0)
PLATELETS: 181 10*3/uL (ref 150–400)
RBC: 3.61 MIL/uL — AB (ref 4.22–5.81)
RDW: 13.8 % (ref 11.5–15.5)
WBC: 11.7 10*3/uL — ABNORMAL HIGH (ref 4.0–10.5)

## 2016-11-23 LAB — BLOOD GAS, ARTERIAL
Acid-base deficit: 1.7 mmol/L (ref 0.0–2.0)
Bicarbonate: 21.9 mmol/L (ref 20.0–28.0)
Drawn by: 10552
FIO2: 0.21
O2 Saturation: 92.6 %
Patient temperature: 98.6
pCO2 arterial: 32.6 mmHg (ref 32.0–48.0)
pH, Arterial: 7.441 (ref 7.350–7.450)
pO2, Arterial: 63.1 mmHg — ABNORMAL LOW (ref 83.0–108.0)

## 2016-11-23 LAB — PREPARE RBC (CROSSMATCH)

## 2016-11-23 LAB — ABO/RH: ABO/RH(D): O POS

## 2016-11-23 LAB — HEPARIN LEVEL (UNFRACTIONATED): HEPARIN UNFRACTIONATED: 0.35 [IU]/mL (ref 0.30–0.70)

## 2016-11-23 MED ORDER — DEXTROSE 5 % IV SOLN
1.5000 g | INTRAVENOUS | Status: AC
  Administered 2016-11-24: 1.5 g via INTRAVENOUS
  Administered 2016-11-24: .75 g via INTRAVENOUS
  Filled 2016-11-23: qty 1.5

## 2016-11-23 MED ORDER — CHLORHEXIDINE GLUCONATE 4 % EX LIQD
60.0000 mL | Freq: Once | CUTANEOUS | Status: AC
  Administered 2016-11-24: 4 via TOPICAL

## 2016-11-23 MED ORDER — VANCOMYCIN HCL 10 G IV SOLR
1250.0000 mg | INTRAVENOUS | Status: AC
  Administered 2016-11-24: 1250 mg via INTRAVENOUS
  Filled 2016-11-23: qty 1250

## 2016-11-23 MED ORDER — SODIUM CHLORIDE 0.9 % IV SOLN
INTRAVENOUS | Status: DC
  Filled 2016-11-23: qty 30

## 2016-11-23 MED ORDER — MAGNESIUM SULFATE 50 % IJ SOLN
40.0000 meq | INTRAMUSCULAR | Status: DC
  Filled 2016-11-23: qty 10

## 2016-11-23 MED ORDER — PHENYLEPHRINE HCL 10 MG/ML IJ SOLN
30.0000 ug/min | INTRAMUSCULAR | Status: AC
  Administered 2016-11-24: 10 ug/min via INTRAVENOUS
  Filled 2016-11-23: qty 2

## 2016-11-23 MED ORDER — PLASMA-LYTE 148 IV SOLN
INTRAVENOUS | Status: AC
  Administered 2016-11-24: 500 mL
  Filled 2016-11-23: qty 2.5

## 2016-11-23 MED ORDER — POTASSIUM CHLORIDE 2 MEQ/ML IV SOLN
80.0000 meq | INTRAVENOUS | Status: DC
  Filled 2016-11-23: qty 40

## 2016-11-23 MED ORDER — CHLORHEXIDINE GLUCONATE 4 % EX LIQD
60.0000 mL | Freq: Once | CUTANEOUS | Status: AC
  Administered 2016-11-23: 4 via TOPICAL
  Filled 2016-11-23: qty 15

## 2016-11-23 MED ORDER — METOPROLOL TARTRATE 12.5 MG HALF TABLET
12.5000 mg | ORAL_TABLET | Freq: Once | ORAL | Status: AC
  Administered 2016-11-24: 12.5 mg via ORAL
  Filled 2016-11-23: qty 1

## 2016-11-23 MED ORDER — NITROGLYCERIN IN D5W 200-5 MCG/ML-% IV SOLN
2.0000 ug/min | INTRAVENOUS | Status: AC
  Administered 2016-11-24: 10 ug/min via INTRAVENOUS
  Filled 2016-11-23: qty 250

## 2016-11-23 MED ORDER — TRANEXAMIC ACID (OHS) BOLUS VIA INFUSION
15.0000 mg/kg | INTRAVENOUS | Status: AC
  Administered 2016-11-24: 1156.5 mg via INTRAVENOUS
  Filled 2016-11-23: qty 1157

## 2016-11-23 MED ORDER — TRANEXAMIC ACID 1000 MG/10ML IV SOLN
1.5000 mg/kg/h | INTRAVENOUS | Status: AC
  Administered 2016-11-24: 1.5 mg/kg/h via INTRAVENOUS
  Filled 2016-11-23: qty 25

## 2016-11-23 MED ORDER — DOPAMINE-DEXTROSE 3.2-5 MG/ML-% IV SOLN
0.0000 ug/kg/min | INTRAVENOUS | Status: DC
  Filled 2016-11-23: qty 250

## 2016-11-23 MED ORDER — DEXTROSE 5 % IV SOLN
750.0000 mg | INTRAVENOUS | Status: DC
  Filled 2016-11-23: qty 750

## 2016-11-23 MED ORDER — DEXTROSE 5 % IV SOLN
0.0000 ug/min | INTRAVENOUS | Status: DC
  Filled 2016-11-23: qty 4

## 2016-11-23 MED ORDER — BISACODYL 5 MG PO TBEC
5.0000 mg | DELAYED_RELEASE_TABLET | Freq: Once | ORAL | Status: AC
  Administered 2016-11-22: 5 mg via ORAL
  Filled 2016-11-23: qty 1

## 2016-11-23 MED ORDER — TRANEXAMIC ACID (OHS) PUMP PRIME SOLUTION
2.0000 mg/kg | INTRAVENOUS | Status: DC
  Filled 2016-11-23: qty 1.54

## 2016-11-23 MED ORDER — DEXMEDETOMIDINE HCL IN NACL 400 MCG/100ML IV SOLN
0.1000 ug/kg/h | INTRAVENOUS | Status: DC
  Filled 2016-11-23: qty 100

## 2016-11-23 MED ORDER — CHLORHEXIDINE GLUCONATE 0.12 % MT SOLN
15.0000 mL | Freq: Once | OROMUCOSAL | Status: AC
  Administered 2016-11-24: 15 mL via OROMUCOSAL
  Filled 2016-11-23: qty 15

## 2016-11-23 MED ORDER — INSULIN REGULAR HUMAN 100 UNIT/ML IJ SOLN
INTRAMUSCULAR | Status: DC
  Filled 2016-11-23: qty 1

## 2016-11-23 NOTE — Progress Notes (Signed)
Progress Note  Patient Name: Charlane FerrettiJohn Rison Date of Encounter: 11/23/2016  Primary Cardiologist: Ellis ParentsNew Delton See(Nelson)  Subjective   No complaints, lying fully flat in bed. No new episodes of arrhythmia. Favorable findings on carotid ultrasound and lower extremity ultrasound yesterday  Inpatient Medications    Scheduled Meds: . aspirin EC  81 mg Oral Daily  . atorvastatin  80 mg Oral q1800  . isosorbide mononitrate  30 mg Oral Daily  . metoprolol tartrate  25 mg Oral BID  . sodium chloride flush  3 mL Intravenous Q12H  . sodium chloride flush  3 mL Intravenous Q12H  . sodium chloride flush  3 mL Intravenous Q12H   Continuous Infusions: . sodium chloride    . sodium chloride Stopped (11/22/16 0539)  . sodium chloride    . heparin 1,200 Units/hr (11/22/16 1804)   PRN Meds: sodium chloride, sodium chloride, acetaminophen, ondansetron (ZOFRAN) IV, sodium chloride flush, sodium chloride flush   Vital Signs    Vitals:   11/22/16 1300 11/22/16 2100 11/22/16 2147 11/23/16 0323  BP: (!) 145/77  (!) 149/67 (!) 153/95  Pulse: 79  80 72  Resp: 16  15 16   Temp: 99 F (37.2 C) 99.4 F (37.4 C) 99.5 F (37.5 C) 98.3 F (36.8 C)  TempSrc: Oral Oral Oral Oral  SpO2: 94% 92% 96% 97%  Weight:    169 lb 14.4 oz (77.1 kg)  Height:        Intake/Output Summary (Last 24 hours) at 11/23/16 0924 Last data filed at 11/23/16 0919  Gross per 24 hour  Intake              243 ml  Output              250 ml  Net               -7 ml   Filed Weights   11/21/16 0305 11/22/16 0626 11/23/16 0323  Weight: 168 lb 8 oz (76.4 kg) 171 lb 4.8 oz (77.7 kg) 169 lb 14.4 oz (77.1 kg)    Telemetry    Sinus rhythm - Personally Reviewed  ECG    No new tracing - Personally Reviewed  Physical Exam  Relaxed, lying fully flat in bed GEN: No acute distress.   Neck: No JVD Cardiac: RRR, no murmurs, rubs, or gallops.  Respiratory: Clear to auscultation bilaterally. GI: Soft, nontender, non-distended    MS: No edema; No deformity. Neuro:  Nonfocal  Psych: Normal affect   Labs    Chemistry Recent Labs Lab 11/19/16 2225 11/20/16 0321 11/20/16 0917 11/21/16 0250 11/22/16 0344  NA 132* 135  --  134* 135  K 4.8 4.0  --  4.1 4.4  CL 100* 104  --  105 108  CO2 25 23  --  22 23  GLUCOSE 138* 109*  --  102* 112*  BUN 15 15  --  15 11  CREATININE 1.00 0.98  --  0.92 0.88  CALCIUM 8.9 8.8*  --  8.5* 8.3*  PROT 6.6  --  6.1*  --  5.6*  ALBUMIN 3.3*  --  3.0*  --  2.6*  AST 48*  --  48*  --  67*  ALT 47  --  47  --  64*  ALKPHOS 58  --  55  --  48  BILITOT 0.7  --  0.6  --  0.5  GFRNONAA >60 >60  --  >60 >60  GFRAA >60 >60  --  >  60 >60  ANIONGAP 7 8  --  7 4*     Hematology Recent Labs Lab 11/21/16 0250 11/22/16 0344 11/23/16 0334  WBC 8.5 11.7* 11.7*  RBC 4.19* 3.69* 3.61*  HGB 12.2* 10.9* 10.6*  HCT 35.9* 32.1* 31.0*  MCV 85.7 87.0 85.9  MCH 29.1 29.5 29.4  MCHC 34.0 34.0 34.2  RDW 13.7 14.1 13.8  PLT 149* 166 181    Cardiac Enzymes Recent Labs Lab 11/19/16 2225 11/20/16 0321 11/20/16 0917  TROPONINI 0.03* <0.03 <0.03   No results for input(s): TROPIPOC in the last 168 hours.   BNP Recent Labs Lab 11/19/16 2225  BNP 15.5     DDimer No results for input(s): DDIMER in the last 168 hours.   Radiology    Dg Chest 2 View  Result Date: 11/22/2016 CLINICAL DATA:  Coronary artery disease. Preoperative examination prior to CABG. EXAM: CHEST  2 VIEW COMPARISON:  11/19/2016 radiographs FINDINGS: The cardiomediastinal silhouette is unremarkable. Mild bibasilar atelectasis/scarring again noted. There may be trace bilateral pleural effusions present. There is no evidence of airspace disease, pneumothorax, pulmonary nodule or mass. No acute bony abnormalities are identified. IMPRESSION: No significant change.  Mild basilar atelectasis/scarring. Electronically Signed   By: Harmon PierJeffrey  Hu M.D.   On: 11/22/2016 08:39    Cardiac Studies   Cath 11/21/2016 Conclusion      LM lesion, 30 %stenosed.  Ost LAD to Prox LAD lesion, 30 %stenosed.  Mid LAD lesion, 90 %stenosed.  1st Mrg lesion, 50 %stenosed.  Ost Cx to Prox Cx lesion, 70 %stenosed.  Prox Cx to Dist Cx lesion, 70 %stenosed.  Ost RCA to Prox RCA lesion, 80 %stenosed.  Mid RCA lesion, 100 %stenosed.  LV end diastolic pressure is normal.  Low-normal LV function with a global ejection fraction at 50% with mild mid to basal inferior hypocontractility.  Significant multivessel CAD with evidence for coronary calcification, smooth distal 30% left main stenosis;, 30% ostial LAD stenosis and diffuse 90% LAD stenosis after the takeoff of a very proximal large diagonal 1 vessel; 70% ostial stenosis in the left circumflex coronary artery with 70% bifurcation stenosis of the OM1 with 50% stenosis in the proximal OM1 vessel. The 70% stenosis prior to the bifurcation extending to 60% after the OM takeoff, and diffuse 80% proximal RCA stenosis with total occlusion of the mid RCA vessel with faint bridging collaterals to the mid RCA and faint collateralization from the left coronary injection to the distal RCA branches.  RECOMMENDATION: Surgical consultation for CABG revascularization surgery.      Patient Profile     76 y.o. male with borderline left ventricular systolic function and multivessel coronary artery disease, To be seen by CV surgery today and tentatively scheduled for bypass surgery Monday  Assessment & Plan    Asymptomatic at rest, on intravenous heparin drip. Workup for bypass surgery seems to be complete with generally favorable findings. Tentatively for bypass tomorrow.  Signed, Thurmon FairMihai Khole Arterburn, MD  11/23/2016, 9:24 AM

## 2016-11-23 NOTE — Progress Notes (Signed)
FYI: per blood bank, pt has two unit of blood ready to pick up if needed.  Colleen Canesar Makenleigh Crownover, RN

## 2016-11-23 NOTE — Progress Notes (Signed)
ANTICOAGULATION CONSULT NOTE - Follow Up Consult  Pharmacy Consult for Heparin  Indication: Multi-vessel disease awaiting CABG  Allergies  Allergen Reactions  . Ativan [Lorazepam] Other (See Comments)    "goes wild"    Patient Measurements: Height: 5\' 8"  (172.7 cm) Weight: 169 lb 14.4 oz (77.1 kg) IBW/kg (Calculated) : 68.4  Vital Signs: Temp: 98.3 F (36.8 C) (08/05 0323) Temp Source: Oral (08/05 0323) BP: 153/95 (08/05 0323) Pulse Rate: 72 (08/05 0323)  Labs:  Recent Labs  11/20/16 0917  11/21/16 0250 11/22/16 0344 11/22/16 1238 11/23/16 0334  HGB  --   < > 12.2* 10.9*  --  10.6*  HCT  --   --  35.9* 32.1*  --  31.0*  PLT  --   --  149* 166  --  181  LABPROT  --   --  15.4* 16.1*  --   --   INR  --   --  1.21 1.29  --   --   HEPARINUNFRC  --   --   --  0.16* 0.33 0.35  CREATININE  --   --  0.92 0.88  --   --   TROPONINI <0.03  --   --   --   --   --   < > = values in this interval not displayed.  Estimated Creatinine Clearance: 71.3 mL/min (by C-G formula based on SCr of 0.88 mg/dL).   Assessment: On heparin s/p cath for multi-vessel disease, tentative CABG on Monday. Heparin level therapeutic at 0.35. Hgb decreased slightly, now 10.6. Pltc up to 181. No bleeding noted.   Goal of Therapy:  Heparin level 0.3-0.7 units/ml Monitor platelets by anticoagulation protocol: Yes   Plan:  Continue heparin gtt at 1200 units/hr Daily heparin level and CBC Monitor for s/sx of bleeding   Breon Diss N. Zigmund Danieleja, PharmD PGY1 Pharmacy Resident Pager: [phone]  11/23/2016,7:41 AM

## 2016-11-23 NOTE — Anesthesia Preprocedure Evaluation (Addendum)
Anesthesia Evaluation  Patient identified by MRN, date of birth, ID band Patient awake    Reviewed: Allergy & Precautions, NPO status , Patient's Chart, lab work & pertinent test results  Airway Mallampati: II  TM Distance: >3 FB Neck ROM: Full    Dental  (+) Dental Advisory Given   Pulmonary neg pulmonary ROS,    breath sounds clear to auscultation       Cardiovascular hypertension, + CAD   Rhythm:Regular Rate:Normal     Neuro/Psych negative neurological ROS     GI/Hepatic negative GI ROS, Neg liver ROS,   Endo/Other  negative endocrine ROS  Renal/GU negative Renal ROS     Musculoskeletal   Abdominal   Peds  Hematology  (+) anemia ,   Anesthesia Other Findings   Reproductive/Obstetrics                            Lab Results  Component Value Date   WBC 10.8 (H) 11/24/2016   HGB 11.8 (L) 11/24/2016   HCT 35.3 (L) 11/24/2016   MCV 86.3 11/24/2016   PLT 241 11/24/2016   Lab Results  Component Value Date   CREATININE 0.84 11/24/2016   BUN 9 11/24/2016   NA 137 11/24/2016   K 4.3 11/24/2016   CL 106 11/24/2016   CO2 26 11/24/2016    Anesthesia Physical Anesthesia Plan  ASA: IV  Anesthesia Plan: General   Post-op Pain Management:    Induction: Intravenous  PONV Risk Score and Plan: 3 and Ondansetron, Dexamethasone, Midazolam and Treatment may vary due to age or medical condition  Airway Management Planned: Oral ETT  Additional Equipment: Arterial line, CVP, PA Cath, TEE and Ultrasound Guidance Line Placement  Intra-op Plan:   Post-operative Plan: Post-operative intubation/ventilation  Informed Consent: I have reviewed the patients History and Physical, chart, labs and discussed the procedure including the risks, benefits and alternatives for the proposed anesthesia with the patient or authorized representative who has indicated his/her understanding and acceptance.    Dental advisory given  Plan Discussed with: CRNA  Anesthesia Plan Comments:        Anesthesia Quick Evaluation

## 2016-11-24 ENCOUNTER — Inpatient Hospital Stay (HOSPITAL_COMMUNITY): Payer: Medicare Other

## 2016-11-24 ENCOUNTER — Inpatient Hospital Stay (HOSPITAL_COMMUNITY): Payer: Medicare Other | Admitting: Anesthesiology

## 2016-11-24 ENCOUNTER — Inpatient Hospital Stay (HOSPITAL_COMMUNITY)
Admission: AD | Disposition: A | Discharge status: Home / Self Care | Payer: Self-pay | Source: Other Acute Inpatient Hospital | Attending: Cardiothoracic Surgery

## 2016-11-24 DIAGNOSIS — Z951 Presence of aortocoronary bypass graft: Secondary | ICD-10-CM

## 2016-11-24 HISTORY — PX: TEE WITHOUT CARDIOVERSION: SHX5443

## 2016-11-24 HISTORY — PX: CORONARY ARTERY BYPASS GRAFT: SHX141

## 2016-11-24 LAB — POCT I-STAT 3, ART BLOOD GAS (G3+)
ACID-BASE DEFICIT: 1 mmol/L (ref 0.0–2.0)
ACID-BASE DEFICIT: 7 mmol/L — AB (ref 0.0–2.0)
ACID-BASE DEFICIT: 3 mmol/L — AB (ref 0.0–2.0)
ACID-BASE DEFICIT: 2 mmol/L (ref 0.0–2.0)
ACID-BASE EXCESS: 1 mmol/L (ref 0.0–2.0)
Acid-base deficit: 1 mmol/L (ref 0.0–2.0)
Acid-base deficit: 4 mmol/L — ABNORMAL HIGH (ref 0.0–2.0)
Acid-base deficit: 3 mmol/L — ABNORMAL HIGH (ref 0.0–2.0)
Acid-base deficit: 1 mmol/L (ref 0.0–2.0)
BICARBONATE: 23.8 mmol/L (ref 20.0–28.0)
BICARBONATE: 23.8 mmol/L (ref 20.0–28.0)
BICARBONATE: 25.2 mmol/L (ref 20.0–28.0)
BICARBONATE: 22 mmol/L (ref 20.0–28.0)
BICARBONATE: 24.4 mmol/L (ref 20.0–28.0)
BICARBONATE: 18.5 mmol/L — AB (ref 20.0–28.0)
BICARBONATE: 22.9 mmol/L (ref 20.0–28.0)
BICARBONATE: 23 mmol/L (ref 20.0–28.0)
Bicarbonate: 21.6 mmol/L (ref 20.0–28.0)
Bicarbonate: 24.4 mmol/L (ref 20.0–28.0)
Bicarbonate: 21.5 mmol/L (ref 20.0–28.0)
O2 SAT: 100 %
O2 SAT: 98 %
O2 SAT: 100 %
O2 SAT: 100 %
O2 SAT: 95 %
O2 SAT: 96 %
O2 Saturation: 100 %
O2 Saturation: 100 %
O2 Saturation: 99 %
O2 Saturation: 94 %
O2 Saturation: 97 %
PCO2 ART: 39.9 mmHg (ref 32.0–48.0)
PCO2 ART: 40.3 mmHg (ref 32.0–48.0)
PCO2 ART: 39.1 mmHg (ref 32.0–48.0)
PCO2 ART: 33.8 mmHg (ref 32.0–48.0)
PCO2 ART: 35 mmHg (ref 32.0–48.0)
PCO2 ART: 37.2 mmHg (ref 32.0–48.0)
PH ART: 7.385 (ref 7.350–7.450)
PH ART: 7.399 (ref 7.350–7.450)
PO2 ART: 110 mmHg — AB (ref 83.0–108.0)
PO2 ART: 296 mmHg — AB (ref 83.0–108.0)
PO2 ART: 297 mmHg — AB (ref 83.0–108.0)
PO2 ART: 129 mmHg — AB (ref 83.0–108.0)
PO2 ART: 282 mmHg — AB (ref 83.0–108.0)
PO2 ART: 74 mmHg — AB (ref 83.0–108.0)
PO2 ART: 68 mmHg — AB (ref 83.0–108.0)
PO2 ART: 84 mmHg (ref 83.0–108.0)
Patient temperature: 35.5
Patient temperature: 36.6
TCO2: 23 mmol/L (ref 0–100)
TCO2: 25 mmol/L (ref 0–100)
TCO2: 25 mmol/L (ref 0–100)
TCO2: 26 mmol/L (ref 0–100)
TCO2: 23 mmol/L (ref 0–100)
TCO2: 25 mmol/L (ref 0–100)
TCO2: 26 mmol/L (ref 0–100)
TCO2: 20 mmol/L (ref 0–100)
TCO2: 24 mmol/L (ref 0–100)
TCO2: 23 mmol/L (ref 0–100)
TCO2: 24 mmol/L (ref 0–100)
pCO2 arterial: 35.2 mmHg (ref 32.0–48.0)
pCO2 arterial: 39.2 mmHg (ref 32.0–48.0)
pCO2 arterial: 35.2 mmHg (ref 32.0–48.0)
pCO2 arterial: 40.8 mmHg (ref 32.0–48.0)
pCO2 arterial: 34.7 mmHg (ref 32.0–48.0)
pH, Arterial: 7.34 — ABNORMAL LOW (ref 7.350–7.450)
pH, Arterial: 7.379 (ref 7.350–7.450)
pH, Arterial: 7.416 (ref 7.350–7.450)
pH, Arterial: 7.439 (ref 7.350–7.450)
pH, Arterial: 7.357 (ref 7.350–7.450)
pH, Arterial: 7.449 (ref 7.350–7.450)
pH, Arterial: 7.339 — ABNORMAL LOW (ref 7.350–7.450)
pH, Arterial: 7.421 (ref 7.350–7.450)
pH, Arterial: 7.397 (ref 7.350–7.450)
pO2, Arterial: 327 mmHg — ABNORMAL HIGH (ref 83.0–108.0)
pO2, Arterial: 256 mmHg — ABNORMAL HIGH (ref 83.0–108.0)
pO2, Arterial: 91 mmHg (ref 83.0–108.0)

## 2016-11-24 LAB — POCT I-STAT, CHEM 8
BUN: 8 mg/dL (ref 6–20)
BUN: 6 mg/dL (ref 6–20)
BUN: 7 mg/dL (ref 6–20)
BUN: 8 mg/dL (ref 6–20)
BUN: 7 mg/dL (ref 6–20)
BUN: 8 mg/dL (ref 6–20)
BUN: 8 mg/dL (ref 6–20)
BUN: 10 mg/dL (ref 6–20)
CALCIUM ION: 1.08 mmol/L — AB (ref 1.15–1.40)
CALCIUM ION: 1.2 mmol/L (ref 1.15–1.40)
CALCIUM ION: 1.21 mmol/L (ref 1.15–1.40)
CHLORIDE: 101 mmol/L (ref 101–111)
CHLORIDE: 104 mmol/L (ref 101–111)
CHLORIDE: 107 mmol/L (ref 101–111)
CREATININE: 0.5 mg/dL — AB (ref 0.61–1.24)
CREATININE: 0.6 mg/dL — AB (ref 0.61–1.24)
Calcium, Ion: 1.26 mmol/L (ref 1.15–1.40)
Calcium, Ion: 1.24 mmol/L (ref 1.15–1.40)
Calcium, Ion: 1.12 mmol/L — ABNORMAL LOW (ref 1.15–1.40)
Calcium, Ion: 1.15 mmol/L (ref 1.15–1.40)
Calcium, Ion: 1.16 mmol/L (ref 1.15–1.40)
Chloride: 104 mmol/L (ref 101–111)
Chloride: 104 mmol/L (ref 101–111)
Chloride: 101 mmol/L (ref 101–111)
Chloride: 102 mmol/L (ref 101–111)
Chloride: 108 mmol/L (ref 101–111)
Creatinine, Ser: 0.6 mg/dL — ABNORMAL LOW (ref 0.61–1.24)
Creatinine, Ser: 0.5 mg/dL — ABNORMAL LOW (ref 0.61–1.24)
Creatinine, Ser: 0.5 mg/dL — ABNORMAL LOW (ref 0.61–1.24)
Creatinine, Ser: 0.6 mg/dL — ABNORMAL LOW (ref 0.61–1.24)
Creatinine, Ser: 0.5 mg/dL — ABNORMAL LOW (ref 0.61–1.24)
Creatinine, Ser: 0.7 mg/dL (ref 0.61–1.24)
GLUCOSE: 160 mg/dL — AB (ref 65–99)
GLUCOSE: 165 mg/dL — AB (ref 65–99)
GLUCOSE: 89 mg/dL (ref 65–99)
Glucose, Bld: 125 mg/dL — ABNORMAL HIGH (ref 65–99)
Glucose, Bld: 108 mg/dL — ABNORMAL HIGH (ref 65–99)
Glucose, Bld: 110 mg/dL — ABNORMAL HIGH (ref 65–99)
Glucose, Bld: 111 mg/dL — ABNORMAL HIGH (ref 65–99)
Glucose, Bld: 192 mg/dL — ABNORMAL HIGH (ref 65–99)
HCT: 23 % — ABNORMAL LOW (ref 39.0–52.0)
HCT: 24 % — ABNORMAL LOW (ref 39.0–52.0)
HCT: 29 % — ABNORMAL LOW (ref 39.0–52.0)
HEMATOCRIT: 30 % — AB (ref 39.0–52.0)
HEMATOCRIT: 27 % — AB (ref 39.0–52.0)
HEMATOCRIT: 28 % — AB (ref 39.0–52.0)
HEMATOCRIT: 24 % — AB (ref 39.0–52.0)
HEMATOCRIT: 25 % — AB (ref 39.0–52.0)
HEMOGLOBIN: 10.2 g/dL — AB (ref 13.0–17.0)
HEMOGLOBIN: 8.2 g/dL — AB (ref 13.0–17.0)
HEMOGLOBIN: 8.2 g/dL — AB (ref 13.0–17.0)
HEMOGLOBIN: 8.5 g/dL — AB (ref 13.0–17.0)
HEMOGLOBIN: 9.9 g/dL — AB (ref 13.0–17.0)
Hemoglobin: 7.8 g/dL — ABNORMAL LOW (ref 13.0–17.0)
Hemoglobin: 9.2 g/dL — ABNORMAL LOW (ref 13.0–17.0)
Hemoglobin: 9.5 g/dL — ABNORMAL LOW (ref 13.0–17.0)
POTASSIUM: 3.8 mmol/L (ref 3.5–5.1)
POTASSIUM: 5.1 mmol/L (ref 3.5–5.1)
POTASSIUM: 5.6 mmol/L — AB (ref 3.5–5.1)
POTASSIUM: 4.5 mmol/L (ref 3.5–5.1)
Potassium: 3.8 mmol/L (ref 3.5–5.1)
Potassium: 3.9 mmol/L (ref 3.5–5.1)
Potassium: 4.3 mmol/L (ref 3.5–5.1)
Potassium: 5.4 mmol/L — ABNORMAL HIGH (ref 3.5–5.1)
SODIUM: 138 mmol/L (ref 135–145)
SODIUM: 136 mmol/L (ref 135–145)
SODIUM: 138 mmol/L (ref 135–145)
SODIUM: 140 mmol/L (ref 135–145)
SODIUM: 135 mmol/L (ref 135–145)
Sodium: 135 mmol/L (ref 135–145)
Sodium: 135 mmol/L (ref 135–145)
Sodium: 138 mmol/L (ref 135–145)
TCO2: 21 mmol/L (ref 0–100)
TCO2: 21 mmol/L (ref 0–100)
TCO2: 23 mmol/L (ref 0–100)
TCO2: 24 mmol/L (ref 0–100)
TCO2: 25 mmol/L (ref 0–100)
TCO2: 22 mmol/L (ref 0–100)
TCO2: 24 mmol/L (ref 0–100)
TCO2: 23 mmol/L (ref 0–100)

## 2016-11-24 LAB — CBC
HCT: 32.8 % — ABNORMAL LOW (ref 39.0–52.0)
HEMATOCRIT: 35.3 % — AB (ref 39.0–52.0)
HEMATOCRIT: 28.8 % — AB (ref 39.0–52.0)
HEMOGLOBIN: 11.8 g/dL — AB (ref 13.0–17.0)
HEMOGLOBIN: 9.7 g/dL — AB (ref 13.0–17.0)
Hemoglobin: 11.3 g/dL — ABNORMAL LOW (ref 13.0–17.0)
MCH: 28.9 pg (ref 26.0–34.0)
MCH: 29 pg (ref 26.0–34.0)
MCH: 29.8 pg (ref 26.0–34.0)
MCHC: 33.4 g/dL (ref 30.0–36.0)
MCHC: 33.7 g/dL (ref 30.0–36.0)
MCHC: 34.5 g/dL (ref 30.0–36.0)
MCV: 86.3 fL (ref 78.0–100.0)
MCV: 86 fL (ref 78.0–100.0)
MCV: 86.5 fL (ref 78.0–100.0)
Platelets: 241 10*3/uL (ref 150–400)
Platelets: 127 10*3/uL — ABNORMAL LOW (ref 150–400)
Platelets: 194 10*3/uL (ref 150–400)
RBC: 4.09 MIL/uL — ABNORMAL LOW (ref 4.22–5.81)
RBC: 3.35 MIL/uL — AB (ref 4.22–5.81)
RBC: 3.79 MIL/uL — ABNORMAL LOW (ref 4.22–5.81)
RDW: 13.9 % (ref 11.5–15.5)
RDW: 13.9 % (ref 11.5–15.5)
RDW: 14.2 % (ref 11.5–15.5)
WBC: 10.8 10*3/uL — ABNORMAL HIGH (ref 4.0–10.5)
WBC: 9 10*3/uL (ref 4.0–10.5)
WBC: 16.9 10*3/uL — ABNORMAL HIGH (ref 4.0–10.5)

## 2016-11-24 LAB — BASIC METABOLIC PANEL
Anion gap: 5 (ref 5–15)
BUN: 9 mg/dL (ref 6–20)
CO2: 26 mmol/L (ref 22–32)
Calcium: 8.8 mg/dL — ABNORMAL LOW (ref 8.9–10.3)
Chloride: 106 mmol/L (ref 101–111)
Creatinine, Ser: 0.84 mg/dL (ref 0.61–1.24)
GFR calc Af Amer: >60 mL/min (ref >60)
GFR calc non Af Amer: >60 mL/min (ref >60)
Glucose, Bld: 118 mg/dL — ABNORMAL HIGH (ref 65–99)
Potassium: 4.3 mmol/L (ref 3.5–5.1)
Sodium: 137 mmol/L (ref 135–145)

## 2016-11-24 LAB — POCT I-STAT 4, (NA,K, GLUC, HGB,HCT)
GLUCOSE: 142 mg/dL — AB (ref 65–99)
HCT: 26 % — ABNORMAL LOW (ref 39.0–52.0)
Hemoglobin: 8.8 g/dL — ABNORMAL LOW (ref 13.0–17.0)
POTASSIUM: 4.7 mmol/L (ref 3.5–5.1)
SODIUM: 139 mmol/L (ref 135–145)

## 2016-11-24 LAB — APTT
aPTT: 139 seconds — ABNORMAL HIGH (ref 24–36)
aPTT: 40 seconds — ABNORMAL HIGH (ref 24–36)

## 2016-11-24 LAB — GLUCOSE, CAPILLARY
GLUCOSE-CAPILLARY: 118 mg/dL — AB (ref 65–99)
GLUCOSE-CAPILLARY: 123 mg/dL — AB (ref 65–99)
GLUCOSE-CAPILLARY: 134 mg/dL — AB (ref 65–99)
GLUCOSE-CAPILLARY: 153 mg/dL — AB (ref 65–99)
GLUCOSE-CAPILLARY: 95 mg/dL (ref 65–99)
GLUCOSE-CAPILLARY: 95 mg/dL (ref 65–99)
Glucose-Capillary: 105 mg/dL — ABNORMAL HIGH (ref 65–99)
Glucose-Capillary: 111 mg/dL — ABNORMAL HIGH (ref 65–99)
Glucose-Capillary: 126 mg/dL — ABNORMAL HIGH (ref 65–99)
Glucose-Capillary: 130 mg/dL — ABNORMAL HIGH (ref 65–99)

## 2016-11-24 LAB — MAGNESIUM: Magnesium: 3.6 mg/dL — ABNORMAL HIGH (ref 1.7–2.4)

## 2016-11-24 LAB — CREATININE, SERUM
Creatinine, Ser: 0.76 mg/dL (ref 0.61–1.24)
GFR calc Af Amer: >60 mL/min (ref >60)
GFR calc non Af Amer: >60 mL/min (ref >60)

## 2016-11-24 LAB — PROTIME-INR
INR: 1.16
INR: 1.49
PROTHROMBIN TIME: 18.1 s — AB (ref 11.4–15.2)
Prothrombin Time: 14.8 seconds (ref 11.4–15.2)

## 2016-11-24 LAB — HEPARIN LEVEL (UNFRACTIONATED): HEPARIN UNFRACTIONATED: 0.55 [IU]/mL (ref 0.30–0.70)

## 2016-11-24 LAB — PREPARE RBC (CROSSMATCH)

## 2016-11-24 LAB — PLATELET COUNT: Platelets: 109 10*3/uL — ABNORMAL LOW (ref 150–400)

## 2016-11-24 LAB — HEMOGLOBIN AND HEMATOCRIT, BLOOD
HCT: 23.8 % — ABNORMAL LOW (ref 39.0–52.0)
Hemoglobin: 8.2 g/dL — ABNORMAL LOW (ref 13.0–17.0)

## 2016-11-24 SURGERY — CORONARY ARTERY BYPASS GRAFTING (CABG)
Anesthesia: General | Site: Chest

## 2016-11-24 MED ORDER — FENTANYL CITRATE (PF) 250 MCG/5ML IJ SOLN
INTRAMUSCULAR | Status: AC
  Filled 2016-11-24: qty 20

## 2016-11-24 MED ORDER — MORPHINE SULFATE (PF) 2 MG/ML IV SOLN: 2.0000 mg | INTRAVENOUS | Status: DC | PRN

## 2016-11-24 MED ORDER — LIDOCAINE HCL (CARDIAC) 20 MG/ML IV SOLN
INTRAVENOUS | Status: DC | PRN
  Administered 2016-11-24: 100 mg via INTRAVENOUS

## 2016-11-24 MED ORDER — THROMBIN 5000 UNITS EX SOLR
CUTANEOUS | Status: DC | PRN
  Administered 2016-11-24: 5000 [IU] via TOPICAL

## 2016-11-24 MED ORDER — LACTATED RINGERS IV SOLN
INTRAVENOUS | Status: DC | PRN
  Administered 2016-11-24: 07:00:00 via INTRAVENOUS

## 2016-11-24 MED ORDER — ACETAMINOPHEN 500 MG PO TABS
1000.0000 mg | ORAL_TABLET | Freq: Four times a day (QID) | ORAL | Status: DC
  Administered 2016-11-24 – 2016-11-29 (×17): 1000 mg via ORAL
  Filled 2016-11-24 (×18): qty 2

## 2016-11-24 MED ORDER — SODIUM CHLORIDE 0.9 % IJ SOLN
OROMUCOSAL | Status: DC | PRN
  Administered 2016-11-24 (×3): 4 mL via TOPICAL

## 2016-11-24 MED ORDER — HEPARIN SODIUM (PORCINE) 1000 UNIT/ML IJ SOLN
INTRAMUSCULAR | Status: AC
  Filled 2016-11-24: qty 1

## 2016-11-24 MED ORDER — ARTIFICIAL TEARS OPHTHALMIC OINT
TOPICAL_OINTMENT | OPHTHALMIC | Status: DC | PRN
  Administered 2016-11-24: 1 via OPHTHALMIC

## 2016-11-24 MED ORDER — ALBUMIN HUMAN 5 % IV SOLN
INTRAVENOUS | Status: DC | PRN
  Administered 2016-11-24: 12:00:00 via INTRAVENOUS

## 2016-11-24 MED ORDER — SODIUM CHLORIDE 0.9 % IV SOLN
INTRAVENOUS | Status: DC | PRN
  Administered 2016-11-24: 13:00:00 via INTRAVENOUS

## 2016-11-24 MED ORDER — DEXAMETHASONE SODIUM PHOSPHATE 10 MG/ML IJ SOLN
INTRAMUSCULAR | Status: DC | PRN
  Administered 2016-11-24: 10 mg via INTRAVENOUS

## 2016-11-24 MED ORDER — PANTOPRAZOLE SODIUM 40 MG PO TBEC
40.0000 mg | DELAYED_RELEASE_TABLET | Freq: Every day | ORAL | Status: DC
  Administered 2016-11-26 – 2016-11-29 (×4): 40 mg via ORAL
  Filled 2016-11-24 (×4): qty 1

## 2016-11-24 MED ORDER — INSULIN REGULAR BOLUS VIA INFUSION
0.0000 [IU] | Freq: Three times a day (TID) | INTRAVENOUS | Status: DC
  Filled 2016-11-24: qty 10

## 2016-11-24 MED ORDER — HEMOSTATIC AGENTS (NO CHARGE) OPTIME
TOPICAL | Status: DC | PRN
  Administered 2016-11-24 (×2): 1 via TOPICAL

## 2016-11-24 MED ORDER — ROCURONIUM BROMIDE 100 MG/10ML IV SOLN
INTRAVENOUS | Status: DC | PRN
  Administered 2016-11-24 (×4): 50 mg via INTRAVENOUS

## 2016-11-24 MED ORDER — METOCLOPRAMIDE HCL 5 MG/ML IJ SOLN
10.0000 mg | Freq: Four times a day (QID) | INTRAMUSCULAR | Status: DC
  Administered 2016-11-24 – 2016-11-27 (×12): 10 mg via INTRAVENOUS
  Filled 2016-11-24 (×11): qty 2

## 2016-11-24 MED ORDER — FENTANYL CITRATE (PF) 250 MCG/5ML IJ SOLN
INTRAMUSCULAR | Status: AC
  Filled 2016-11-24: qty 5

## 2016-11-24 MED ORDER — FENTANYL CITRATE (PF) 250 MCG/5ML IJ SOLN
INTRAMUSCULAR | Status: DC | PRN
Start: 2016-11-24 — End: 2016-11-24
  Administered 2016-11-24: 50 ug via INTRAVENOUS
  Administered 2016-11-24: 100 ug via INTRAVENOUS
  Administered 2016-11-24: 150 ug via INTRAVENOUS
  Administered 2016-11-24: 300 ug via INTRAVENOUS
  Administered 2016-11-24: 100 ug via INTRAVENOUS
  Administered 2016-11-24: 250 ug via INTRAVENOUS
  Administered 2016-11-24: 100 ug via INTRAVENOUS
  Administered 2016-11-24: 200 ug via INTRAVENOUS

## 2016-11-24 MED ORDER — FAMOTIDINE IN NACL 20-0.9 MG/50ML-% IV SOLN
20.0000 mg | Freq: Two times a day (BID) | INTRAVENOUS | Status: AC
  Administered 2016-11-24: 20 mg via INTRAVENOUS

## 2016-11-24 MED ORDER — MIDAZOLAM HCL 10 MG/2ML IJ SOLN
INTRAMUSCULAR | Status: AC
  Filled 2016-11-24: qty 2

## 2016-11-24 MED ORDER — NITROGLYCERIN IN D5W 200-5 MCG/ML-% IV SOLN
0.0000 ug/min | INTRAVENOUS | Status: DC
  Administered 2016-11-25: 50 ug/min via INTRAVENOUS
  Filled 2016-11-24: qty 250

## 2016-11-24 MED ORDER — MORPHINE SULFATE (PF) 2 MG/ML IV SOLN: 1.0000 mg | INTRAVENOUS | Status: DC | PRN

## 2016-11-24 MED ORDER — LACTATED RINGERS IV SOLN: 500.0000 mL | Freq: Once | INTRAVENOUS | Status: DC | PRN

## 2016-11-24 MED ORDER — BISACODYL 10 MG RE SUPP: 10.0000 mg | Freq: Every day | RECTAL | Status: DC

## 2016-11-24 MED ORDER — ATORVASTATIN CALCIUM 80 MG PO TABS
80.0000 mg | ORAL_TABLET | Freq: Every day | ORAL | Status: DC
  Administered 2016-11-25 – 2016-11-28 (×4): 80 mg via ORAL
  Filled 2016-11-24 (×4): qty 1

## 2016-11-24 MED ORDER — LACTATED RINGERS IV SOLN
INTRAVENOUS | Status: DC
Start: 2016-11-24 — End: 2016-11-26

## 2016-11-24 MED ORDER — MORPHINE SULFATE (PF) 4 MG/ML IV SOLN: 1.0000 mg | INTRAVENOUS | Status: AC | PRN

## 2016-11-24 MED ORDER — SODIUM CHLORIDE 0.9 % IV SOLN
0.0000 ug/kg/h | INTRAVENOUS | Status: DC
  Filled 2016-11-24: qty 2

## 2016-11-24 MED ORDER — PHENYLEPHRINE HCL 10 MG/ML IJ SOLN
INTRAMUSCULAR | Status: DC | PRN
  Administered 2016-11-24: 40 ug via INTRAVENOUS

## 2016-11-24 MED ORDER — TRAMADOL HCL 50 MG PO TABS
50.0000 mg | ORAL_TABLET | ORAL | Status: DC | PRN
  Administered 2016-11-25: 100 mg via ORAL
  Filled 2016-11-24: qty 2

## 2016-11-24 MED ORDER — DOCUSATE SODIUM 100 MG PO CAPS
200.0000 mg | ORAL_CAPSULE | Freq: Every day | ORAL | Status: DC
  Administered 2016-11-25 – 2016-11-29 (×5): 200 mg via ORAL
  Filled 2016-11-24 (×5): qty 2

## 2016-11-24 MED ORDER — SODIUM BICARBONATE 8.4 % IV SOLN
50.0000 meq | Freq: Once | INTRAVENOUS | Status: AC
  Administered 2016-11-24: 50 meq via INTRAVENOUS

## 2016-11-24 MED ORDER — DEXAMETHASONE SODIUM PHOSPHATE 10 MG/ML IJ SOLN
INTRAMUSCULAR | Status: AC
  Filled 2016-11-24: qty 1

## 2016-11-24 MED ORDER — POTASSIUM CHLORIDE 10 MEQ/50ML IV SOLN: 10.0000 meq | INTRAVENOUS | Status: AC

## 2016-11-24 MED ORDER — ONDANSETRON HCL 4 MG/2ML IJ SOLN
4.0000 mg | Freq: Four times a day (QID) | INTRAMUSCULAR | Status: DC | PRN
  Administered 2016-11-26: 4 mg via INTRAVENOUS
  Filled 2016-11-24: qty 2

## 2016-11-24 MED ORDER — INSULIN REGULAR HUMAN 100 UNIT/ML IJ SOLN
INTRAMUSCULAR | Status: DC | PRN
  Administered 2016-11-24: 1.3 [IU]/h via INTRAVENOUS

## 2016-11-24 MED ORDER — MAGNESIUM SULFATE 4 GM/100ML IV SOLN
4.0000 g | Freq: Once | INTRAVENOUS | Status: AC
  Administered 2016-11-24: 4 g via INTRAVENOUS
  Filled 2016-11-24: qty 100

## 2016-11-24 MED ORDER — PROPOFOL 10 MG/ML IV BOLUS
INTRAVENOUS | Status: AC
  Filled 2016-11-24: qty 20

## 2016-11-24 MED ORDER — PROPOFOL 10 MG/ML IV BOLUS
INTRAVENOUS | Status: DC | PRN
  Administered 2016-11-24: 130 mg via INTRAVENOUS

## 2016-11-24 MED ORDER — BISACODYL 5 MG PO TBEC
10.0000 mg | DELAYED_RELEASE_TABLET | Freq: Every day | ORAL | Status: DC
  Administered 2016-11-25 – 2016-11-27 (×3): 10 mg via ORAL
  Filled 2016-11-24 (×3): qty 2

## 2016-11-24 MED ORDER — CHLORHEXIDINE GLUCONATE 0.12 % MT SOLN
15.0000 mL | OROMUCOSAL | Status: AC
  Administered 2016-11-24: 15 mL via OROMUCOSAL

## 2016-11-24 MED ORDER — PROTAMINE SULFATE 10 MG/ML IV SOLN
INTRAVENOUS | Status: DC | PRN
  Administered 2016-11-24: 10 mg via INTRAVENOUS
  Administered 2016-11-24: 90 mg via INTRAVENOUS
  Administered 2016-11-24: 100 mg via INTRAVENOUS

## 2016-11-24 MED ORDER — ASPIRIN 81 MG PO CHEW: 324.0000 mg | CHEWABLE_TABLET | Freq: Every day | ORAL | Status: DC

## 2016-11-24 MED ORDER — METOPROLOL TARTRATE 5 MG/5ML IV SOLN
2.5000 mg | INTRAVENOUS | Status: DC | PRN
  Administered 2016-11-25 – 2016-11-27 (×3): 5 mg via INTRAVENOUS
  Filled 2016-11-24 (×3): qty 5

## 2016-11-24 MED ORDER — SODIUM CHLORIDE 0.9% FLUSH: 3.0000 mL | INTRAVENOUS | Status: DC | PRN

## 2016-11-24 MED ORDER — SODIUM CHLORIDE 0.9 % IV SOLN
INTRAVENOUS | Status: DC
  Filled 2016-11-24: qty 1

## 2016-11-24 MED ORDER — ROCURONIUM BROMIDE 10 MG/ML (PF) SYRINGE
PREFILLED_SYRINGE | INTRAVENOUS | Status: AC
  Filled 2016-11-24: qty 5

## 2016-11-24 MED ORDER — MORPHINE SULFATE (PF) 4 MG/ML IV SOLN: 2.0000 mg | INTRAVENOUS | Status: DC | PRN

## 2016-11-24 MED ORDER — ALBUMIN HUMAN 5 % IV SOLN
250.0000 mL | INTRAVENOUS | Status: AC | PRN
  Administered 2016-11-24: 250 mL via INTRAVENOUS

## 2016-11-24 MED ORDER — SODIUM CHLORIDE 0.9 % IV SOLN
0.0000 ug/min | INTRAVENOUS | Status: DC
  Filled 2016-11-24: qty 2

## 2016-11-24 MED ORDER — SODIUM CHLORIDE 0.9 % IJ SOLN
INTRAMUSCULAR | Status: AC
  Filled 2016-11-24: qty 10

## 2016-11-24 MED ORDER — SODIUM CHLORIDE 0.9 % IV SOLN
INTRAVENOUS | Status: DC
  Administered 2016-11-24: 14:00:00 via INTRAVENOUS

## 2016-11-24 MED ORDER — LIDOCAINE 2% (20 MG/ML) 5 ML SYRINGE
INTRAMUSCULAR | Status: AC
  Filled 2016-11-24: qty 5

## 2016-11-24 MED ORDER — 0.9 % SODIUM CHLORIDE (POUR BTL) OPTIME
TOPICAL | Status: DC | PRN
  Administered 2016-11-24: 6000 mL

## 2016-11-24 MED ORDER — ORAL CARE MOUTH RINSE
15.0000 mL | Freq: Four times a day (QID) | OROMUCOSAL | Status: DC
  Administered 2016-11-24 – 2016-11-25 (×2): 15 mL via OROMUCOSAL

## 2016-11-24 MED ORDER — METOPROLOL TARTRATE 25 MG/10 ML ORAL SUSPENSION: 12.5000 mg | Freq: Two times a day (BID) | ORAL | Status: DC

## 2016-11-24 MED ORDER — DEXTROSE 5 % IV SOLN
1.5000 g | Freq: Two times a day (BID) | INTRAVENOUS | Status: AC
  Administered 2016-11-24 – 2016-11-26 (×4): 1.5 g via INTRAVENOUS
  Filled 2016-11-24 (×4): qty 1.5

## 2016-11-24 MED ORDER — ASPIRIN EC 325 MG PO TBEC
325.0000 mg | DELAYED_RELEASE_TABLET | Freq: Every day | ORAL | Status: DC
  Administered 2016-11-25 – 2016-11-29 (×5): 325 mg via ORAL
  Filled 2016-11-24 (×5): qty 1

## 2016-11-24 MED ORDER — SODIUM CHLORIDE 0.9 % IV SOLN: 250.0000 mL | INTRAVENOUS | Status: DC

## 2016-11-24 MED ORDER — METOPROLOL TARTRATE 12.5 MG HALF TABLET
12.5000 mg | ORAL_TABLET | Freq: Two times a day (BID) | ORAL | Status: DC
  Administered 2016-11-24: 12.5 mg via ORAL
  Filled 2016-11-24: qty 1

## 2016-11-24 MED ORDER — ACETAMINOPHEN 160 MG/5ML PO SOLN: 650.0000 mg | Freq: Once | ORAL | Status: AC

## 2016-11-24 MED ORDER — OXYCODONE HCL 5 MG PO TABS
5.0000 mg | ORAL_TABLET | ORAL | Status: DC | PRN
  Administered 2016-11-24 – 2016-11-25 (×5): 5 mg via ORAL
  Filled 2016-11-24 (×5): qty 1

## 2016-11-24 MED ORDER — LACTATED RINGERS IV SOLN: INTRAVENOUS | Status: DC

## 2016-11-24 MED ORDER — SUCCINYLCHOLINE CHLORIDE 200 MG/10ML IV SOSY
PREFILLED_SYRINGE | INTRAVENOUS | Status: AC
  Filled 2016-11-24: qty 10

## 2016-11-24 MED ORDER — SODIUM CHLORIDE 0.9% FLUSH
3.0000 mL | Freq: Two times a day (BID) | INTRAVENOUS | Status: DC
  Administered 2016-11-25 – 2016-11-26 (×3): 3 mL via INTRAVENOUS
  Administered 2016-11-26: 10 mL via INTRAVENOUS
  Administered 2016-11-27 – 2016-11-28 (×3): 3 mL via INTRAVENOUS

## 2016-11-24 MED ORDER — HEPARIN SODIUM (PORCINE) 1000 UNIT/ML IJ SOLN
INTRAMUSCULAR | Status: DC | PRN
  Administered 2016-11-24: 2 mL via INTRAVENOUS
  Administered 2016-11-24: 20 mL via INTRAVENOUS

## 2016-11-24 MED ORDER — PROTAMINE SULFATE 10 MG/ML IV SOLN
INTRAVENOUS | Status: AC
  Filled 2016-11-24: qty 25

## 2016-11-24 MED ORDER — LACTATED RINGERS IV SOLN
INTRAVENOUS | Status: DC | PRN
  Administered 2016-11-24 (×2): via INTRAVENOUS

## 2016-11-24 MED ORDER — ACETAMINOPHEN 650 MG RE SUPP
650.0000 mg | Freq: Once | RECTAL | Status: AC
  Administered 2016-11-24: 650 mg via RECTAL

## 2016-11-24 MED ORDER — SODIUM CHLORIDE 0.45 % IV SOLN: INTRAVENOUS | Status: DC | PRN

## 2016-11-24 MED ORDER — VANCOMYCIN HCL IN DEXTROSE 1-5 GM/200ML-% IV SOLN
1000.0000 mg | Freq: Once | INTRAVENOUS | Status: AC
  Administered 2016-11-24: 1000 mg via INTRAVENOUS
  Filled 2016-11-24: qty 200

## 2016-11-24 MED ORDER — MIDAZOLAM HCL 5 MG/5ML IJ SOLN
INTRAMUSCULAR | Status: DC | PRN
  Administered 2016-11-24: 1 mg via INTRAVENOUS
  Administered 2016-11-24: 5 mg via INTRAVENOUS
  Administered 2016-11-24: 4 mg via INTRAVENOUS

## 2016-11-24 MED ORDER — DEXMEDETOMIDINE HCL 200 MCG/2ML IV SOLN
INTRAVENOUS | Status: DC | PRN
  Administered 2016-11-24: .2 ug/kg/h via INTRAVENOUS

## 2016-11-24 MED ORDER — CHLORHEXIDINE GLUCONATE 0.12% ORAL RINSE (MEDLINE KIT)
15.0000 mL | Freq: Two times a day (BID) | OROMUCOSAL | Status: DC
  Administered 2016-11-24 – 2016-11-25 (×2): 15 mL via OROMUCOSAL

## 2016-11-24 MED ORDER — ACETAMINOPHEN 160 MG/5ML PO SOLN: 1000.0000 mg | Freq: Four times a day (QID) | ORAL | Status: DC

## 2016-11-24 MED ORDER — ARTIFICIAL TEARS OPHTHALMIC OINT
TOPICAL_OINTMENT | OPHTHALMIC | Status: AC
  Filled 2016-11-24: qty 3.5

## 2016-11-24 MED ORDER — THROMBIN 5000 UNITS EX SOLR
CUTANEOUS | Status: AC
  Filled 2016-11-24: qty 5000

## 2016-11-24 SURGICAL SUPPLY — 102 items
ADAPTER CARDIO PERF ANTE/RETRO (ADAPTER) ×3 IMPLANT
BAG DECANTER FOR FLEXI CONT (MISCELLANEOUS) ×3 IMPLANT
BANDAGE ACE 4X5 VEL STRL LF (GAUZE/BANDAGES/DRESSINGS) ×3 IMPLANT
BANDAGE ACE 6X5 VEL STRL LF (GAUZE/BANDAGES/DRESSINGS) ×3 IMPLANT
BASKET HEART (ORDER IN 25'S) (MISCELLANEOUS) ×1
BASKET HEART (ORDER IN 25S) (MISCELLANEOUS) ×2 IMPLANT
BLADE CLIPPER SURG (BLADE) ×3 IMPLANT
BLADE STERNUM SYSTEM 6 (BLADE) ×3 IMPLANT
BLADE SURG 11 STRL SS (BLADE) ×3 IMPLANT
BLADE SURG 12 STRL SS (BLADE) ×3 IMPLANT
BNDG GAUZE ELAST 4 BULKY (GAUZE/BANDAGES/DRESSINGS) ×3 IMPLANT
CANISTER SUCT 3000ML PPV (MISCELLANEOUS) ×3 IMPLANT
CANNULA GUNDRY RCSP 15FR (MISCELLANEOUS) ×3 IMPLANT
CATH CPB KIT VANTRIGT (MISCELLANEOUS) ×3 IMPLANT
CATH ROBINSON RED A/P 18FR (CATHETERS) ×9 IMPLANT
CATH THORACIC 36FR RT ANG (CATHETERS) ×3 IMPLANT
CLIP FOGARTY SPRING 6M (CLIP) ×3 IMPLANT
CLIP VESOCCLUDE SM WIDE 24/CT (CLIP) ×3 IMPLANT
CRADLE DONUT ADULT HEAD (MISCELLANEOUS) ×3 IMPLANT
DRAIN CHANNEL 32F RND 10.7 FF (WOUND CARE) ×3 IMPLANT
DRAPE CARDIOVASCULAR INCISE (DRAPES) ×1
DRAPE SLUSH/WARMER DISC (DRAPES) ×3 IMPLANT
DRAPE SRG 135X102X78XABS (DRAPES) ×2 IMPLANT
DRSG AQUACEL AG ADV 3.5X14 (GAUZE/BANDAGES/DRESSINGS) ×3 IMPLANT
ELECT BLADE 4.0 EZ CLEAN MEGAD (MISCELLANEOUS) ×3
ELECT BLADE 6.5 EXT (BLADE) ×3 IMPLANT
ELECT CAUTERY BLADE 6.4 (BLADE) ×3 IMPLANT
ELECT REM PT RETURN 9FT ADLT (ELECTROSURGICAL) ×6
ELECTRODE BLDE 4.0 EZ CLN MEGD (MISCELLANEOUS) ×2 IMPLANT
ELECTRODE REM PT RTRN 9FT ADLT (ELECTROSURGICAL) ×4 IMPLANT
FELT TEFLON 1X6 (MISCELLANEOUS) ×3 IMPLANT
GAUZE SPONGE 4X4 12PLY STRL (GAUZE/BANDAGES/DRESSINGS) ×6 IMPLANT
GLOVE BIO SURGEON STRL SZ7.5 (GLOVE) ×9 IMPLANT
GLOVE SURG SS PI 6.0 STRL IVOR (GLOVE) ×3 IMPLANT
GOWN STRL REUS W/ TWL LRG LVL3 (GOWN DISPOSABLE) ×16 IMPLANT
GOWN STRL REUS W/TWL LRG LVL3 (GOWN DISPOSABLE) ×8
HEMOSTAT POWDER SURGIFOAM 1G (HEMOSTASIS) ×9 IMPLANT
HEMOSTAT SURGICEL 2X14 (HEMOSTASIS) ×3 IMPLANT
INSERT FOGARTY XLG (MISCELLANEOUS) IMPLANT
KIT BASIN OR (CUSTOM PROCEDURE TRAY) ×3 IMPLANT
KIT ROOM TURNOVER OR (KITS) ×3 IMPLANT
KIT SUCTION CATH 14FR (SUCTIONS) ×3 IMPLANT
KIT VASOVIEW HEMOPRO VH 3000 (KITS) ×3 IMPLANT
LEAD PACING MYOCARDI (MISCELLANEOUS) ×3 IMPLANT
MARKER GRAFT CORONARY BYPASS (MISCELLANEOUS) ×9 IMPLANT
NS IRRIG 1000ML POUR BTL (IV SOLUTION) ×18 IMPLANT
PACK OPEN HEART (CUSTOM PROCEDURE TRAY) ×3 IMPLANT
PAD ARMBOARD 7.5X6 YLW CONV (MISCELLANEOUS) ×6 IMPLANT
PAD ELECT DEFIB RADIOL ZOLL (MISCELLANEOUS) ×3 IMPLANT
PENCIL BUTTON HOLSTER BLD 10FT (ELECTRODE) ×3 IMPLANT
PUNCH AORTIC ROTATE  4.5MM 8IN (MISCELLANEOUS) ×3 IMPLANT
PUNCH AORTIC ROTATE 4.0MM (MISCELLANEOUS) IMPLANT
PUNCH AORTIC ROTATE 4.5MM 8IN (MISCELLANEOUS) IMPLANT
PUNCH AORTIC ROTATE 5MM 8IN (MISCELLANEOUS) IMPLANT
SET CARDIOPLEGIA MPS 5001102 (MISCELLANEOUS) ×3 IMPLANT
SOLUTION ANTI FOG 6CC (MISCELLANEOUS) ×3 IMPLANT
SPOGE SURGIFLO 8M (HEMOSTASIS) ×1
SPONGE SURGIFLO 8M (HEMOSTASIS) ×2 IMPLANT
SURGIFLO W/THROMBIN 8M KIT (HEMOSTASIS) ×3 IMPLANT
SUT BONE WAX W31G (SUTURE) ×3 IMPLANT
SUT ETHIBOND 2 0 SH (SUTURE) ×4
SUT ETHIBOND 2 0 SH 36X2 (SUTURE) ×8 IMPLANT
SUT MNCRL AB 4-0 PS2 18 (SUTURE) ×3 IMPLANT
SUT PROLENE 3 0 SH DA (SUTURE) IMPLANT
SUT PROLENE 3 0 SH1 36 (SUTURE) IMPLANT
SUT PROLENE 4 0 RB 1 (SUTURE) ×2
SUT PROLENE 4 0 SH DA (SUTURE) ×3 IMPLANT
SUT PROLENE 4-0 RB1 .5 CRCL 36 (SUTURE) ×4 IMPLANT
SUT PROLENE 5 0 C 1 36 (SUTURE) ×6 IMPLANT
SUT PROLENE 6 0 C 1 30 (SUTURE) ×27 IMPLANT
SUT PROLENE 6 0 CC (SUTURE) ×9 IMPLANT
SUT PROLENE 8 0 BV175 6 (SUTURE) ×6 IMPLANT
SUT PROLENE BLUE 7 0 (SUTURE) ×3 IMPLANT
SUT PROLENE POLY MONO (SUTURE) ×3 IMPLANT
SUT SILK  1 MH (SUTURE) ×3
SUT SILK 1 MH (SUTURE) ×6 IMPLANT
SUT SILK 1 TIES 10X30 (SUTURE) ×3 IMPLANT
SUT SILK 2 0 SH CR/8 (SUTURE) ×6 IMPLANT
SUT SILK 2 0 TIES 10X30 (SUTURE) ×3 IMPLANT
SUT SILK 2 0 TIES 17X18 (SUTURE) ×1
SUT SILK 2-0 18XBRD TIE BLK (SUTURE) ×2 IMPLANT
SUT SILK 3 0 SH CR/8 (SUTURE) ×3 IMPLANT
SUT SILK 4 0 TIE 10X30 (SUTURE) ×6 IMPLANT
SUT STEEL 6MS V (SUTURE) ×6 IMPLANT
SUT STEEL SZ 6 DBL 3X14 BALL (SUTURE) ×3 IMPLANT
SUT TEM PAC WIRE 2 0 SH (SUTURE) ×12 IMPLANT
SUT VIC AB 1 CTX 36 (SUTURE) ×2
SUT VIC AB 1 CTX36XBRD ANBCTR (SUTURE) ×4 IMPLANT
SUT VIC AB 2-0 CT1 27 (SUTURE) ×1
SUT VIC AB 2-0 CT1 TAPERPNT 27 (SUTURE) ×2 IMPLANT
SUT VIC AB 2-0 CTX 27 (SUTURE) ×6 IMPLANT
SUT VIC AB 3-0 X1 27 (SUTURE) ×6 IMPLANT
SUTURE E-PAK OPEN HEART (SUTURE) IMPLANT
SYSTEM SAHARA CHEST DRAIN ATS (WOUND CARE) ×3 IMPLANT
TOWEL GREEN STERILE (TOWEL DISPOSABLE) ×3 IMPLANT
TOWEL GREEN STERILE FF (TOWEL DISPOSABLE) ×3 IMPLANT
TOWEL OR 17X24 6PK STRL BLUE (TOWEL DISPOSABLE) IMPLANT
TOWEL OR 17X26 10 PK STRL BLUE (TOWEL DISPOSABLE) IMPLANT
TRAY FOLEY SILVER 16FR TEMP (SET/KITS/TRAYS/PACK) ×3 IMPLANT
TUBING INSUFFLATION (TUBING) ×3 IMPLANT
UNDERPAD 30X30 (UNDERPADS AND DIAPERS) ×3 IMPLANT
WATER STERILE IRR 1000ML POUR (IV SOLUTION) ×6 IMPLANT

## 2016-11-24 NOTE — Progress Notes (Signed)
1 amp of sodium bicarb given per MD verbal order for initial ABG 30 minutes after arrival from OR.   Hermina BartersBOWMAN, Jeramyah Goodpasture M, RN

## 2016-11-24 NOTE — OR Nursing (Signed)
Forty-five minute call to SICU charge nurse at 1216. Spoke to Salinaonya.

## 2016-11-24 NOTE — Progress Notes (Signed)
Called and spoke with daughter to give an update. Patient extubated at 1840, resting comfortably, IS 250-will continue to work on this with patient, call bell in reach, will continue to monitor.  Hermina BartersBOWMAN, Jisela Merlino M, RN

## 2016-11-24 NOTE — Procedures (Signed)
Extubation Procedure Note  Patient Details:   Name: Jesus FerrettiJohn Curtis DOB: 02/14/1941 MRN: [id number]   Airway Documentation:     Evaluation  O2 sats: stable throughout Complications: No apparent complications Patient did tolerate procedure well. Bilateral Breath Sounds: Clear   Yes   Patient extubated to 4lnc. Vital signs stable at this time. No complications. Patient is tolerating well at this time. RN at bedside. RT will continue to monitor.  NIF-30 VC-1.3L  Ave Filterdkins, Vantasia Pinkney Williams 11/24/2016, 6:39 PM

## 2016-11-24 NOTE — Anesthesia Procedure Notes (Signed)
Procedure Name: Intubation Date/Time: 11/24/2016 7:47 AM Performed by: Trixie Deis A Pre-anesthesia Checklist: Patient identified, Emergency Drugs available, Suction available and Patient being monitored Patient Re-evaluated:Patient Re-evaluated prior to induction Oxygen Delivery Method: Circle System Utilized Preoxygenation: Pre-oxygenation with 100% oxygen Induction Type: IV induction Ventilation: Mask ventilation without difficulty Laryngoscope Size: Mac and 4 Grade View: Grade I Tube type: Oral Tube size: 8.0 mm Number of attempts: 1 Airway Equipment and Method: Stylet and Oral airway Placement Confirmation: ETT inserted through vocal cords under direct vision,  positive ETCO2 and breath sounds checked- equal and bilateral Secured at: 23 cm Tube secured with: Tape Dental Injury: Teeth and Oropharynx as per pre-operative assessment

## 2016-11-24 NOTE — Anesthesia Postprocedure Evaluation (Signed)
Anesthesia Post Note  Patient: Jesus Curtis  Procedure(s) Performed: Procedure(s) (LRB): CORONARY ARTERY BYPASS GRAFTING (CABG), ON PUMP, TIMES FOUR, USING LEFT INTERNAL MAMMARY ARTERY AND ENDOSCOPICALLY HARVESTED RIGHT GREATER SAPHENOUS VEIN (N/A) TRANSESOPHAGEAL ECHOCARDIOGRAM (TEE) (N/A)     Patient location during evaluation: SICU Anesthesia Type: General Level of consciousness: sedated Pain management: pain level controlled Vital Signs Assessment: post-procedure vital signs reviewed and stable Respiratory status: patient remains intubated per anesthesia plan Cardiovascular status: stable Anesthetic complications: no    Last Vitals:  Vitals:   11/24/16 0533 11/24/16 1320  BP: (!) 144/101   Pulse: 64   Resp:  18  Temp:      Last Pain:  Vitals:   11/24/16 0500  TempSrc: Oral  PainSc:                  Jesus Curtis

## 2016-11-24 NOTE — Progress Notes (Signed)
Patient was preped as per order-  4 hibiclins application  X  2 -  chest area and leg clipped - moputh care with peridex done-  All required lab work done , excet of the UA   Due to unavailability of specimen- Report   Called to anesthesia team. Patient 's family at bed side, Teaching was done - patient and family  Verbalized  Understanding.

## 2016-11-24 NOTE — Transfer of Care (Signed)
Immediate Anesthesia Transfer of Care Note  Patient: Jesus Curtis  Procedure(s) Performed: Procedure(s) with comments: CORONARY ARTERY BYPASS GRAFTING (CABG), ON PUMP, TIMES FOUR, USING LEFT INTERNAL MAMMARY ARTERY AND ENDOSCOPICALLY HARVESTED RIGHT GREATER SAPHENOUS VEIN (N/A) - LIMA to LAD SVG to PDA SVG to OM1 SVG to Diag1 TRANSESOPHAGEAL ECHOCARDIOGRAM (TEE) (N/A)  Patient Location: SICU  Anesthesia Type:General  Level of Consciousness: Patient remains intubated per anesthesia plan  Airway & Oxygen Therapy: Patient remains intubated per anesthesia plan and Patient placed on Ventilator (see vital sign flow sheet for setting)  Post-op Assessment: Report given to RN and Post -op Vital signs reviewed and stable  Post vital signs: Reviewed and stable  Last Vitals:  Vitals:   11/24/16 0500 11/24/16 0533  BP: (!) 144/101 (!) 144/101  Pulse: 64 64  Resp: 20   Temp: 37 C     Last Pain:  Vitals:   11/24/16 0500  TempSrc: Oral  PainSc:          Complications: No apparent anesthesia complications

## 2016-11-24 NOTE — Anesthesia Procedure Notes (Signed)
Arterial Line Insertion Performed by: Dorie RankQUINN, HOLLY M, CRNA  Preanesthetic checklist: patient identified, IV checked, site marked, risks and benefits discussed, surgical consent, monitors and equipment checked, pre-op evaluation and timeout performed Lidocaine 1% used for infiltration Left, radial was placed Catheter size: 20 G Hand hygiene performed , maximum sterile barriers used  and Seldinger technique used Allen's test indicative of satisfactory collateral circulation Attempts: 1 Ultrasound Notes:anatomy identified Following insertion, dressing applied and Biopatch. Post procedure assessment: normal  Patient tolerated the procedure well with no immediate complications.

## 2016-11-24 NOTE — Brief Op Note (Signed)
11/19/2016 - 11/24/2016  11:44 AM  PATIENT:  Jesus FerrettiJohn Curtis  76 y.o. male  PRE-OPERATIVE DIAGNOSIS:  CAD  POST-OPERATIVE DIAGNOSIS:  CAD  PROCEDURE:  Procedure(s): CORONARY ARTERY BYPASS GRAFTING (CABG), ON PUMP, TIMES FOUR, USING LEFT INTERNAL MAMMARY ARTERY AND ENDOSCOPICALLY HARVESTED RIGHT GREATER SAPHENOUS VEIN (N/A) TRANSESOPHAGEAL ECHOCARDIOGRAM (TEE) (N/A)  LIMA to LAD SVG to PDA SVG to OM1 SVG to Diag1   SURGEON:  Surgeon(s) and Role:    Kerin Perna* Van Trigt, Peter, MD - Primary  PHYSICIAN ASSISTANT:  Jari Favreessa Dorrien Grunder, PA-C   ANESTHESIA:   general  EBL:  Total I/O In: 1300 [I.V.:1300] Out: 560 [Urine:560]  BLOOD ADMINISTERED:none  DRAINS: ROUTINE   LOCAL MEDICATIONS USED:  NONE  SPECIMEN:  No Specimen  DISPOSITION OF SPECIMEN:  N/A  COUNTS:  YES  TOURNIQUET:  * No tourniquets in log *  DICTATION: .Dragon Dictation  PLAN OF CARE: Admit to inpatient   PATIENT DISPOSITION:  ICU - intubated and hemodynamically stable.   Delay start of Pharmacological VTE agent (>24hrs) due to surgical blood loss or risk of bleeding: yes

## 2016-11-24 NOTE — Anesthesia Procedure Notes (Signed)
Central Venous Catheter Insertion Performed by: Marcene DuosFITZGERALD, Kiarah Eckstein, anesthesiologist Start/End8/09/2016 7:10 AM, 11/24/2016 7:20 AM Patient location: Pre-op. Preanesthetic checklist: patient identified, IV checked, site marked, risks and benefits discussed, surgical consent, monitors and equipment checked, pre-op evaluation, timeout performed and anesthesia consent Position: Trendelenburg Lidocaine 1% used for infiltration and patient sedated Hand hygiene performed , maximum sterile barriers used  and Seldinger technique used Catheter size: 8.5 Fr Total catheter length 10. Central line and PA cath was placed.Sheath introducer Swan type:thermodilution PA Cath depth:50 Procedure performed using ultrasound guided technique. Ultrasound Notes:anatomy identified, needle tip was noted to be adjacent to the nerve/plexus identified, no ultrasound evidence of intravascular and/or intraneural injection and image(s) printed for medical record Attempts: 1 Following insertion, line sutured and dressing applied. Post procedure assessment: blood return through all ports, free fluid flow and no air  Patient tolerated the procedure well with no immediate complications.

## 2016-11-24 NOTE — OR Nursing (Signed)
Twenty minute call to SICU charge nurse at 1244. Spoke to [place]. Cath Lab also notified.

## 2016-11-24 NOTE — Progress Notes (Signed)
Patient ID: Jesus FerrettiJohn Pritz, male   DOB: 01/05/1941, 76 y.o.   MRN: [id number]  SICU Evening Rounds:   Hemodynamically stable  CI = 1.85  Still asleep on vent  Urine output good  CT output low  CBC    Component Value Date/Time   WBC 9.0 11/24/2016 1318   RBC 3.35 (L) 11/24/2016 1318   HGB 8.8 (L) 11/24/2016 1325   HCT 26.0 (L) 11/24/2016 1325   PLT 127 (L) 11/24/2016 1318   MCV 86.0 11/24/2016 1318   MCH 29.0 11/24/2016 1318   MCHC 33.7 11/24/2016 1318   RDW 13.9 11/24/2016 1318   LYMPHSABS 5.7 (H) 11/19/2016 2225   MONOABS 0.9 11/19/2016 2225   EOSABS 0.0 11/19/2016 2225   BASOSABS 0.3 (H) 11/19/2016 2225     BMET    Component Value Date/Time   NA 139 11/24/2016 1325   K 4.7 11/24/2016 1325   CL 101 11/24/2016 1215   CO2 26 11/24/2016 0622   GLUCOSE 142 (H) 11/24/2016 1325   BUN 8 11/24/2016 1215   CREATININE 0.50 (L) 11/24/2016 1215   CALCIUM 8.8 (L) 11/24/2016 0622   GFRNONAA >60 11/24/2016 0622   GFRAA >60 11/24/2016 0622     A/P:  Stable postop course. Continue current plans

## 2016-11-24 NOTE — Progress Notes (Signed)
  Echocardiogram Echocardiogram Transesophageal has been performed.  Tye SavoyCasey N Dustan Hyams 11/24/2016, 8:50 AM

## 2016-11-24 NOTE — Progress Notes (Signed)
The patient was examined and preop studies reviewed. There has been no change from the prior exam and the patient is ready for surgery.   Plan CABG on J Statz

## 2016-11-24 NOTE — Anesthesia Procedure Notes (Signed)
Central Venous Catheter Insertion Performed by: Marcene DuosFITZGERALD, Gianny Killman, anesthesiologist Start/End8/09/2016 7:10 AM, 11/24/2016 7:20 AM Patient location: Pre-op. Preanesthetic checklist: patient identified, IV checked, site marked, risks and benefits discussed, surgical consent, monitors and equipment checked, pre-op evaluation, timeout performed and anesthesia consent Hand hygiene performed  and maximum sterile barriers used  PA cath was placed.Swan type:thermodilution PA Cath depth:45 Procedure performed using ultrasound guided technique. Ultrasound Notes:anatomy identified, needle tip was noted to be adjacent to the nerve/plexus identified, no ultrasound evidence of intravascular and/or intraneural injection and image(s) printed for medical record Attempts: 1 Patient tolerated the procedure well with no immediate complications.

## 2016-11-25 ENCOUNTER — Encounter (HOSPITAL_COMMUNITY): Payer: Self-pay | Admitting: Cardiothoracic Surgery

## 2016-11-25 ENCOUNTER — Inpatient Hospital Stay (HOSPITAL_COMMUNITY): Payer: Medicare Other

## 2016-11-25 LAB — ECHO TEE
AO mean calculated velocity dopler: 96.1 cm/s
AV Mean grad: 4 mmHg
AV Peak grad: 10 mmHg
AV pk vel: 157 cm/s
LVOT area: 4.15 cm2
LVOT diameter: 23 mm
VTI: 31.3 cm

## 2016-11-25 LAB — GLUCOSE, CAPILLARY
GLUCOSE-CAPILLARY: 109 mg/dL — AB (ref 65–99)
GLUCOSE-CAPILLARY: 112 mg/dL — AB (ref 65–99)
GLUCOSE-CAPILLARY: 115 mg/dL — AB (ref 65–99)
GLUCOSE-CAPILLARY: 115 mg/dL — AB (ref 65–99)
GLUCOSE-CAPILLARY: 115 mg/dL — AB (ref 65–99)
GLUCOSE-CAPILLARY: 116 mg/dL — AB (ref 65–99)
GLUCOSE-CAPILLARY: 131 mg/dL — AB (ref 65–99)
GLUCOSE-CAPILLARY: 144 mg/dL — AB (ref 65–99)
Glucose-Capillary: 96 mg/dL (ref 65–99)
Glucose-Capillary: 118 mg/dL — ABNORMAL HIGH (ref 65–99)
Glucose-Capillary: 128 mg/dL — ABNORMAL HIGH (ref 65–99)
Glucose-Capillary: 151 mg/dL — ABNORMAL HIGH (ref 65–99)
Glucose-Capillary: 149 mg/dL — ABNORMAL HIGH (ref 65–99)
Glucose-Capillary: 127 mg/dL — ABNORMAL HIGH (ref 65–99)
Glucose-Capillary: 141 mg/dL — ABNORMAL HIGH (ref 65–99)

## 2016-11-25 LAB — CBC
HEMATOCRIT: 29.9 % — AB (ref 39.0–52.0)
HEMATOCRIT: 29.8 % — AB (ref 39.0–52.0)
HEMOGLOBIN: 10.3 g/dL — AB (ref 13.0–17.0)
HEMOGLOBIN: 10.1 g/dL — AB (ref 13.0–17.0)
MCH: 29.8 pg (ref 26.0–34.0)
MCH: 28.9 pg (ref 26.0–34.0)
MCHC: 34.4 g/dL (ref 30.0–36.0)
MCHC: 33.9 g/dL (ref 30.0–36.0)
MCV: 86.4 fL (ref 78.0–100.0)
MCV: 85.4 fL (ref 78.0–100.0)
Platelets: 201 10*3/uL (ref 150–400)
Platelets: 227 10*3/uL (ref 150–400)
RBC: 3.46 MIL/uL — AB (ref 4.22–5.81)
RBC: 3.49 MIL/uL — AB (ref 4.22–5.81)
RDW: 14.3 % (ref 11.5–15.5)
RDW: 14 % (ref 11.5–15.5)
WBC: 15.4 10*3/uL — AB (ref 4.0–10.5)
WBC: 12.6 10*3/uL — ABNORMAL HIGH (ref 4.0–10.5)

## 2016-11-25 LAB — BASIC METABOLIC PANEL
Anion gap: 7 (ref 5–15)
BUN: 12 mg/dL (ref 6–20)
CHLORIDE: 107 mmol/L (ref 101–111)
CO2: 21 mmol/L — AB (ref 22–32)
CREATININE: 0.74 mg/dL (ref 0.61–1.24)
Calcium: 8.1 mg/dL — ABNORMAL LOW (ref 8.9–10.3)
GFR calc non Af Amer: >60 mL/min (ref >60)
Glucose, Bld: 94 mg/dL (ref 65–99)
POTASSIUM: 4.4 mmol/L (ref 3.5–5.1)
SODIUM: 135 mmol/L (ref 135–145)

## 2016-11-25 LAB — POCT I-STAT, CHEM 8
BUN: 13 mg/dL (ref 6–20)
CHLORIDE: 98 mmol/L — AB (ref 101–111)
CREATININE: 0.7 mg/dL (ref 0.61–1.24)
Calcium, Ion: 1.19 mmol/L (ref 1.15–1.40)
GLUCOSE: 149 mg/dL — AB (ref 65–99)
HEMATOCRIT: 30 % — AB (ref 39.0–52.0)
Hemoglobin: 10.2 g/dL — ABNORMAL LOW (ref 13.0–17.0)
POTASSIUM: 4.2 mmol/L (ref 3.5–5.1)
Sodium: 134 mmol/L — ABNORMAL LOW (ref 135–145)
TCO2: 23 mmol/L (ref 0–100)

## 2016-11-25 LAB — CREATININE, SERUM
CREATININE: 0.78 mg/dL (ref 0.61–1.24)
GFR calc Af Amer: >60 mL/min (ref >60)

## 2016-11-25 LAB — MAGNESIUM
MAGNESIUM: 2.6 mg/dL — AB (ref 1.7–2.4)
MAGNESIUM: 2.2 mg/dL (ref 1.7–2.4)

## 2016-11-25 MED ORDER — VANCOMYCIN HCL IN DEXTROSE 1-5 GM/200ML-% IV SOLN
1000.0000 mg | Freq: Once | INTRAVENOUS | Status: AC
  Administered 2016-11-25: 1000 mg via INTRAVENOUS
  Filled 2016-11-25: qty 200

## 2016-11-25 MED ORDER — INSULIN DETEMIR 100 UNIT/ML ~~LOC~~ SOLN
10.0000 [IU] | Freq: Two times a day (BID) | SUBCUTANEOUS | Status: DC
  Administered 2016-11-25 – 2016-11-27 (×5): 10 [IU] via SUBCUTANEOUS
  Filled 2016-11-25 (×7): qty 0.1

## 2016-11-25 MED ORDER — METOPROLOL TARTRATE 25 MG PO TABS
25.0000 mg | ORAL_TABLET | Freq: Two times a day (BID) | ORAL | Status: DC
  Administered 2016-11-25 (×2): 25 mg via ORAL
  Filled 2016-11-25 (×2): qty 1

## 2016-11-25 MED ORDER — INSULIN ASPART 100 UNIT/ML ~~LOC~~ SOLN
0.0000 [IU] | SUBCUTANEOUS | Status: DC
  Administered 2016-11-25 – 2016-11-26 (×5): 2 [IU] via SUBCUTANEOUS

## 2016-11-25 MED ORDER — FUROSEMIDE 10 MG/ML IJ SOLN
20.0000 mg | Freq: Two times a day (BID) | INTRAMUSCULAR | Status: AC
  Administered 2016-11-25 – 2016-11-26 (×4): 20 mg via INTRAVENOUS
  Filled 2016-11-25 (×4): qty 2

## 2016-11-25 MED ORDER — LISINOPRIL 2.5 MG PO TABS
2.5000 mg | ORAL_TABLET | Freq: Every day | ORAL | Status: DC
  Administered 2016-11-25: 2.5 mg via ORAL
  Filled 2016-11-25: qty 1

## 2016-11-25 MED ORDER — MORPHINE SULFATE (PF) 4 MG/ML IV SOLN
1.0000 mg | INTRAVENOUS | Status: AC | PRN
  Administered 2016-11-25 (×2): 2 mg via INTRAVENOUS
  Filled 2016-11-25 (×2): qty 1

## 2016-11-25 MED FILL — Dexmedetomidine HCl in NaCl 0.9% IV Soln 400 MCG/100ML: INTRAVENOUS | Qty: 100 | Status: AC

## 2016-11-25 NOTE — Progress Notes (Signed)
Pt complaining of feeling SOB. RN listened to bilateral breath sounds, equal and diminished. Inc 02 to 4L [place]. RR 23, Sats 99%. Pt SBP> 150, prn 5mg  lopressor administered. Nitroglycerin resumed. RN will continue to monitor.

## 2016-11-25 NOTE — Care Management Note (Signed)
Case Management Note  Patient Details  Name: Jesus FerrettiJohn Curtis MRN: [id number] Date of Birth: 06/07/1940  Subjective/Objective:  From home POD 1 CABG, conts on  NTG                  Action/Plan: NCM will follow for dc needs.   Expected Discharge Date:                  Expected Discharge Plan:     In-House Referral:     Discharge planning Services  CM Consult  Post Acute Care Choice:    Choice offered to:     DME Arranged:    DME Agency:     HH Arranged:    HH Agency:     Status of Service:  In process, will continue to follow  If discussed at Long Length of Stay Meetings, dates discussed:    Additional Comments:  Leone Havenaylor, Keniya Schlotterbeck Clinton, RN 11/25/2016, 8:58 PM

## 2016-11-25 NOTE — Op Note (Signed)
NAME:  Jesus Curtis, Jesus Curtis                     ACCOUNT NO.:  MEDICAL RECORD NO.:  [id number]  LOCATION:                                 FACILITY:  PHYSICIAN:  Kerin Perna, M.D.       DATE OF BIRTH:  DATE OF PROCEDURE:  11/24/2016 DATE OF DISCHARGE:                              OPERATIVE REPORT   OPERATIONS: 1. Coronary artery bypass grafting x4 (left internal mammary artery to     left anterior descending, saphenous vein graft to diagonal,     saphenous vein graft to circumflex marginal, saphenous vein graft     to posterior descending branch of the distal circumflex). 2. Endoscopic harvest of right leg greater saphenous vein.  SURGEON:  Kerin Perna, M.D.  ASSISTANT:  Jari Favre, P.A.-C.  ANESTHESIA:  General by Dr. Marcene Duos.  INDICATIONS:  The patient is a 76 year old hypertensive male with symptoms of progressive shortness of breath and dizziness.  He was admitted after presyncopal event and found to have positive troponins. This led to admission and cardiac catheterization.  This demonstrated severe three-vessel coronary artery disease with normal LVEDP.  A subsequent echocardiogram showed LVH with normal systolic function and no significant valvular disease.  Because of his severe multivessel coronary artery disease, he was felt to be a candidate for surgical coronary revascularization and CABG was recommended by his cardiologist.  I examined the patient in his hospital room after reviewing his images of his cardiac cath and echocardiogram.  I agreed with the cardiologist's recommendation for CABG.  I discussed the indications and expected benefits, and expected recovery after surgery with the patient and his family.  I discussed with the patient and his family the potential risks of the surgery including the risk of stroke, bleeding requiring blood transfusion, MI, postoperative pulmonary problems including pleural effusion, postoperative infection, and  death.  After reviewing these issues, he demonstrated his understanding and agreed to proceed with surgery under what I felt was an informed consent.  OPERATIVE FINDINGS: 1. Adequate conduit. 2. Small, but adequate, diffusely diseased targets. 3. Preserved LV systolic function after separation from     cardiopulmonary bypass.  OPERATIVE PROCEDURE:  The patient was brought to the operating room, placed supine on the operating table where general anesthesia was induced under invasive hemodynamic monitoring.  The chest, abdomen and legs were prepped with Betadine and draped as a sterile field.  A proper time-out was performed.  A transesophageal echo probe was placed by the Anesthesia team.  A sternal incision was made as the saphenous vein was harvested endoscopically from the right leg.  The left internal mammary artery was harvested as a pedicle graft from its origin at the subclavian vessels.  It was a 1.5-mm vessel with good flow.  The sternal retractor was placed and the pericardium was opened and suspended. Pursestrings were placed in the ascending aorta and right atrium, and heparin was administered.  When the ACT was documented as being therapeutic, the patient was cannulated and placed on cardiopulmonary bypass.  The coronaries were identified for grafting and the mammary artery and vein grafts were prepared for the distal anastomoses. Cardioplegia  cannulas were placed for both antegrade and retrograde cold blood cardioplegia.  The patient was cooled to 32 degrees and aortic crossclamp was applied.  One liter of cold blood cardioplegia was delivered in split doses between the antegrade aortic and retrograde coronary sinus catheters.  There was good cardioplegic arrest and septal temperature dropped less than 12 degrees.  Cardioplegia was delivered every 14 minutes.  The distal coronary anastomoses were performed.  The first distal anastomosis was to the posterior descending  branch of the distal circumflex.  There was a 1.4-mm vessel with proximal 90% stenosis. There was some mild scarring in the myocardium surrounding the distal circumflex.  A reverse saphenous vein was sewn end-to-side with running 7-0 Prolene with good flow through the graft.  Cardioplegia was redosed.  The second distal anastomosis was to the OM branch of the left circumflex.  This was a 1.5-mm vessel proximal 80% stenosis.  A reverse saphenous vein was sewn end-to-side with running 7-0 Prolene with good flow through the graft.  Cardioplegia was redosed.  The third distal anastomosis was to the diagonal branch of the LAD. This was heavily calcified and had a proximal 80% stenosis.  A reverse saphenous vein was sewn end-to-side with running 7-0 Prolene with good flow through the graft.  Cardioplegia was redosed.  The fourth distal anastomosis was to the mid-to-distal third of the LAD. The vessel was heavily calcified and had a proximal 90% stenosis.  The left internal mammary artery pedicle was brought through an opening in the left lateral pericardium, it was brought down onto the LAD and sewn end-to-side with running 8-0 Prolene.  There was good flow through the anastomosis after briefly releasing the bulldog clamp on the pedicle. The bulldog clamp was reapplied and the pedicle secured the epicardium with 6-0 Prolene.  Cardioplegia was redosed.  Crossclamp was still in place.  Three proximal vein anastomoses were performed on the ascending aorta using a 4.0-mm punch with running 6-0 Prolene.  Prior to tying down the final proximal anastomosis, the air was vented from the coronaries with a dose of retrograde warm blood cardioplegia.  The crossclamp was removed.  The vein grafts were de-aired and opened.  Each had good flow and hemostasis was documented at the proximal and distal sites.  The heart resumed a spontaneous rhythm.  The cardioplegia cannulas were removed.  The patient  was rewarmed and reperfused.  Temporary pacing wires were applied.  The lungs were expanded.  The ventilator was resumed.  When the patient was adequately reperfused, he was weaned off cardiopulmonary bypass without difficulty without requiring inotropes.  Cardiac output and blood pressure were stable.  Echo showed good LV function. Protamine was administered without adverse reaction.  The cannulas were removed.  The mediastinum was irrigated.  The superior pericardial fat was closed.  Anterior mediastinal and left pleural chest tubes were placed and brought out through separate incisions.  The sternum was closed with wire.  The pectoralis fascia was closed with a running #1 Vicryl.  The subcutaneous and skin layers were closed with running Vicryl and sterile dressings were applied.  Total cardiopulmonary bypass time was 110 minutes.     Kerin PernaPeter Van Trigt, M.D.     PV/MEDQ  D:  11/24/2016  T:  11/24/2016  Job:  [id number]  cc:   Nicki Guadalajarahomas Kelly, M.D. Fax: [phone]

## 2016-11-25 NOTE — Progress Notes (Signed)
1 Day Post-Op Procedure(s) (LRB): CORONARY ARTERY BYPASS GRAFTING (CABG), ON PUMP, TIMES FOUR, USING LEFT INTERNAL MAMMARY ARTERY AND ENDOSCOPICALLY HARVESTED RIGHT GREATER SAPHENOUS VEIN (N/A) TRANSESOPHAGEAL ECHOCARDIOGRAM (TEE) (N/A) Subjective: Extubated nsr stable cxr clear  Objective: Vital signs in last 24 hours: Temp:  [95.5 F (35.3 C)-99.3 F (37.4 C)] 98.8 F (37.1 C) (08/07 0800) Pulse Rate:  [80-109] 100 (08/07 0800) Cardiac Rhythm: Normal sinus rhythm (08/07 0800) Resp:  [12-27] 27 (08/07 0800) BP: (94-138)/(69-92) 131/75 (08/07 0800) SpO2:  [90 %-97 %] 91 % (08/07 0800) Arterial Line BP: (95-285)/(56-283) 146/64 (08/07 0800) FiO2 (%):  [40 %-50 %] 40 % (08/06 1804) Weight:  [174 lb 13.2 oz (79.3 kg)] 174 lb 13.2 oz (79.3 kg) (08/07 0450)  Hemodynamic parameters for last 24 hours: PAP: (19-32)/(4-21) 22/8 CO:  [3.4 L/min-6.4 L/min] 6.4 L/min CI:  [1.8 L/min/m2-3.4 L/min/m2] 3.4 L/min/m2  Intake/Output from previous day: 08/06 0701 - 08/07 0700 In: 3941.7 [P.O.:480; I.V.:2436.7; Blood:525; IV Piggyback:500] Out: 3455 [Urine:1615; Blood:1370; Chest Tube:470] Intake/Output this shift: Total I/O In: 116.7 [I.V.:116.7] Out: 30 [Chest Tube:30]       Exam    General- alert and comfortable   Lungs- clear without rales, wheezes   Cor- regular rate and rhythm, no murmur , gallop   Abdomen- soft, non-tender   Extremities - warm, non-tender, minimal edema   Neuro- oriented, appropriate, no focal weakness   Lab Results:  Recent Labs  11/24/16 1829 11/24/16 1836 11/25/16 0336  WBC 16.9*  --  15.4*  HGB 11.3* 9.9* 10.3*  HCT 32.8* 29.0* 29.9*  PLT 194  --  201   BMET:  Recent Labs  11/24/16 0622  11/24/16 1836 11/25/16 0336  NA 137  < > 138 135  K 4.3  < > 4.5 4.4  CL 106  < > 108 107  CO2 26  --   --  21*  GLUCOSE 118*  < > 89 94  BUN 9  < > 10 12  CREATININE 0.84  < > 0.70 0.74  CALCIUM 8.8*  --   --  8.1*  < > = values in this interval not  displayed.  PT/INR:  Recent Labs  11/24/16 1318  LABPROT 18.1*  INR 1.49   ABG    Component Value Date/Time   PHART 7.399 11/24/2016 1943   HCO3 23.0 11/24/2016 1943   TCO2 24 11/24/2016 1943   ACIDBASEDEF 2.0 11/24/2016 1943   O2SAT 96.0 11/24/2016 1943   CBG (last 3)   Recent Labs  11/25/16 0500 11/25/16 0606 11/25/16 0658  GLUCAP 115* 115* 118*    Assessment/Plan: S/P Procedure(s) (LRB): CORONARY ARTERY BYPASS GRAFTING (CABG), ON PUMP, TIMES FOUR, USING LEFT INTERNAL MAMMARY ARTERY AND ENDOSCOPICALLY HARVESTED RIGHT GREATER SAPHENOUS VEIN (N/A) TRANSESOPHAGEAL ECHOCARDIOGRAM (TEE) (N/A) Mobilize Diuresis Diabetes control d/c tubes/lines See progression orders   LOS: 4 days    Kathlee Nationseter Van Trigt III 11/25/2016

## 2016-11-25 NOTE — Progress Notes (Signed)
Patient ID: Jesus FerrettiJohn Curtis, male   DOB: 10/26/1940, 76 y.o.   MRN: [id number] EVENING ROUNDS NOTE :     [address]       [place] [id number]             [phone]                 1 Day Post-Op Procedure(s) (LRB): CORONARY ARTERY BYPASS GRAFTING (CABG), ON PUMP, TIMES FOUR, USING LEFT INTERNAL MAMMARY ARTERY AND ENDOSCOPICALLY HARVESTED RIGHT GREATER SAPHENOUS VEIN (N/A) TRANSESOPHAGEAL ECHOCARDIOGRAM (TEE) (N/A)  Total Length of Stay:  LOS: 4 days  BP 133/90   Pulse 100   Temp 98.7 F (37.1 C) (Oral)   Resp (!) 30   Ht 5\' 8"  (1.727 m)   Wt 174 lb 13.2 oz (79.3 kg)   SpO2 91%   BMI 26.58 kg/m   .Intake/Output      08/06 0701 - 08/07 0700 08/07 0701 - 08/08 0700   P.O. 480 360   I.V. (mL/kg) 2436.7 (30.7) 387.7 (4.9)   Blood 525    IV Piggyback 500 200   Total Intake(mL/kg) 3941.7 (49.7) 947.7 (12)   Urine (mL/kg/hr) 1615 (0.8) 1075 (1.2)   Blood 1370    Chest Tube 470 50   Total Output 3455 1125   Net +486.7 -177.3          . sodium chloride 10 mL/hr at 11/25/16 1600  . sodium chloride    . sodium chloride 20 mL/hr at 11/25/16 0800  . cefUROXime (ZINACEF)  IV Stopped (11/25/16 1649)  . insulin (NOVOLIN-R) infusion Stopped (11/25/16 1230)  . lactated ringers    . lactated ringers 10 mL/hr at 11/25/16 0800  . nitroGLYCERIN 50 mcg/min (11/25/16 1732)     Lab Results  Component Value Date   WBC 12.6 (H) 11/25/2016   HGB 10.2 (L) 11/25/2016   HCT 30.0 (L) 11/25/2016   PLT 227 11/25/2016   GLUCOSE 149 (H) 11/25/2016   CHOL 104 11/20/2016   TRIG 151 (H) 11/20/2016   HDL 10 (L) 11/20/2016   LDLCALC 64 11/20/2016   ALT 64 (H) 11/22/2016   AST 67 (H) 11/22/2016   NA 134 (L) 11/25/2016   K 4.2 11/25/2016   CL 98 (L) 11/25/2016   CREATININE 0.70 11/25/2016   BUN 13 11/25/2016   CO2 21 (L) 11/25/2016   TSH 2.389 11/22/2016   INR 1.49 11/24/2016   HGBA1C 6.7 (H) 11/19/2016   Still on NTG and bp increased  Just got dose of lasix  On  lopressor 25 mg bid  Add low dose ace   Delight OvensEdward B [name] MD  Beeper [phone] Office [phone] 11/25/2016 6:05 PM

## 2016-11-26 ENCOUNTER — Inpatient Hospital Stay (HOSPITAL_COMMUNITY): Payer: Medicare Other

## 2016-11-26 LAB — BASIC METABOLIC PANEL
Anion gap: 9 (ref 5–15)
BUN: 13 mg/dL (ref 6–20)
CO2: 23 mmol/L (ref 22–32)
Calcium: 8.2 mg/dL — ABNORMAL LOW (ref 8.9–10.3)
Chloride: 101 mmol/L (ref 101–111)
Creatinine, Ser: 0.74 mg/dL (ref 0.61–1.24)
GFR calc Af Amer: >60 mL/min (ref >60)
GFR calc non Af Amer: >60 mL/min (ref >60)
Glucose, Bld: 128 mg/dL — ABNORMAL HIGH (ref 65–99)
Potassium: 3.9 mmol/L (ref 3.5–5.1)
Sodium: 133 mmol/L — ABNORMAL LOW (ref 135–145)

## 2016-11-26 LAB — CBC
HCT: 29.3 % — ABNORMAL LOW (ref 39.0–52.0)
Hemoglobin: 9.5 g/dL — ABNORMAL LOW (ref 13.0–17.0)
MCH: 27.7 pg (ref 26.0–34.0)
MCHC: 32.4 g/dL (ref 30.0–36.0)
MCV: 85.4 fL (ref 78.0–100.0)
Platelets: 236 10*3/uL (ref 150–400)
RBC: 3.43 MIL/uL — ABNORMAL LOW (ref 4.22–5.81)
RDW: 14.1 % (ref 11.5–15.5)
WBC: 13.2 10*3/uL — ABNORMAL HIGH (ref 4.0–10.5)

## 2016-11-26 LAB — GLUCOSE, CAPILLARY
GLUCOSE-CAPILLARY: 91 mg/dL (ref 65–99)
GLUCOSE-CAPILLARY: 130 mg/dL — AB (ref 65–99)
GLUCOSE-CAPILLARY: 98 mg/dL (ref 65–99)
Glucose-Capillary: 131 mg/dL — ABNORMAL HIGH (ref 65–99)
Glucose-Capillary: 84 mg/dL (ref 65–99)

## 2016-11-26 MED ORDER — MAGNESIUM HYDROXIDE 400 MG/5ML PO SUSP: 30.0000 mL | Freq: Every day | ORAL | Status: DC | PRN

## 2016-11-26 MED ORDER — POTASSIUM CHLORIDE CRYS ER 20 MEQ PO TBCR
20.0000 meq | EXTENDED_RELEASE_TABLET | Freq: Two times a day (BID) | ORAL | Status: DC
  Administered 2016-11-26 – 2016-11-29 (×6): 20 meq via ORAL
  Filled 2016-11-26 (×6): qty 1

## 2016-11-26 MED ORDER — SODIUM CHLORIDE 0.9 % IV SOLN: 250.0000 mL | INTRAVENOUS | Status: DC | PRN

## 2016-11-26 MED ORDER — SODIUM CHLORIDE 0.9% FLUSH: 3.0000 mL | INTRAVENOUS | Status: DC | PRN

## 2016-11-26 MED ORDER — SODIUM CHLORIDE 0.9% FLUSH
3.0000 mL | Freq: Two times a day (BID) | INTRAVENOUS | Status: DC
  Administered 2016-11-26: 10 mL via INTRAVENOUS
  Administered 2016-11-27: 3 mL via INTRAVENOUS
  Administered 2016-11-27: 10:00:00 via INTRAVENOUS
  Administered 2016-11-28 – 2016-11-29 (×3): 3 mL via INTRAVENOUS

## 2016-11-26 MED ORDER — METOPROLOL TARTRATE 50 MG PO TABS
50.0000 mg | ORAL_TABLET | Freq: Two times a day (BID) | ORAL | Status: DC
  Administered 2016-11-26 – 2016-11-29 (×7): 50 mg via ORAL
  Filled 2016-11-26 (×7): qty 1

## 2016-11-26 MED ORDER — FUROSEMIDE 40 MG PO TABS
40.0000 mg | ORAL_TABLET | Freq: Every day | ORAL | Status: DC
  Administered 2016-11-27 – 2016-11-29 (×3): 40 mg via ORAL
  Filled 2016-11-26 (×3): qty 1

## 2016-11-26 MED ORDER — MOVING RIGHT ALONG BOOK
Freq: Once | Status: AC
  Administered 2016-11-26: 09:00:00
  Filled 2016-11-26: qty 1

## 2016-11-26 MED ORDER — LISINOPRIL 10 MG PO TABS
10.0000 mg | ORAL_TABLET | Freq: Every day | ORAL | Status: DC
  Administered 2016-11-26 – 2016-11-29 (×4): 10 mg via ORAL
  Filled 2016-11-26 (×4): qty 1

## 2016-11-26 MED ORDER — INSULIN ASPART 100 UNIT/ML ~~LOC~~ SOLN
0.0000 [IU] | Freq: Three times a day (TID) | SUBCUTANEOUS | Status: DC
  Administered 2016-11-26: 2 [IU] via SUBCUTANEOUS
  Administered 2016-11-28: 4 [IU] via SUBCUTANEOUS
  Administered 2016-11-29: 2 [IU] via SUBCUTANEOUS

## 2016-11-26 MED FILL — Electrolyte-R (PH 7.4) Solution: INTRAVENOUS | Qty: 4000 | Status: AC

## 2016-11-26 MED FILL — Sodium Chloride IV Soln 0.9%: INTRAVENOUS | Qty: 2000 | Status: AC

## 2016-11-26 MED FILL — Lidocaine HCl IV Inj 20 MG/ML: INTRAVENOUS | Qty: 5 | Status: AC

## 2016-11-26 MED FILL — Mannitol IV Soln 20%: INTRAVENOUS | Qty: 500 | Status: AC

## 2016-11-26 MED FILL — Sodium Bicarbonate IV Soln 8.4%: INTRAVENOUS | Qty: 50 | Status: AC

## 2016-11-26 MED FILL — Heparin Sodium (Porcine) Inj 1000 Unit/ML: INTRAMUSCULAR | Qty: 20 | Status: AC

## 2016-11-26 NOTE — Progress Notes (Signed)
2 Days Post-Op Procedure(s) (LRB): CORONARY ARTERY BYPASS GRAFTING (CABG), ON PUMP, TIMES FOUR, USING LEFT INTERNAL MAMMARY ARTERY AND ENDOSCOPICALLY HARVESTED RIGHT GREATER SAPHENOUS VEIN (N/A) TRANSESOPHAGEAL ECHOCARDIOGRAM (TEE) (N/A) Subjective: Stable on 4L O2, iv NTG for BP CXR clear Objective: Vital signs in last 24 hours: Temp:  [98.1 F (36.7 C)-99.7 F (37.6 C)] 98.7 F (37.1 C) (08/08 0744) Pulse Rate:  [86-126] 104 (08/08 0700) Cardiac Rhythm: Normal sinus rhythm (08/07 2000) Resp:  [18-31] 22 (08/08 0700) BP: (91-158)/(64-118) 144/95 (08/08 0700) SpO2:  [80 %-98 %] 90 % (08/08 0700) Arterial Line BP: (174-186)/(65-75) 186/75 (08/07 0930) Weight:  [172 lb 8 oz (78.2 kg)] 172 lb 8 oz (78.2 kg) (08/08 0500)  Hemodynamic parameters for last 24 hours: PAP: (23-25)/(7-11) 25/11  Intake/Output from previous day: 08/07 0701 - 08/08 0700 In: 1424.7 [P.O.:360; I.V.:864.7; IV Piggyback:200] Out: 2650 [Urine:2600; Chest Tube:50] Intake/Output this shift: No intake/output data recorded.       Exam    General- alert and comfortable   Lungs- clear without rales, wheezes   Cor- regular rate and rhythm, no murmur , gallop   Abdomen- soft, non-tender   Extremities - warm, non-tender, minimal edema   Neuro- oriented, appropriate, no focal weakness   Lab Results:  Recent Labs  11/25/16 1708 11/25/16 1720 11/26/16 0427  WBC 12.6*  --  13.2*  HGB 10.1* 10.2* 9.5*  HCT 29.8* 30.0* 29.3*  PLT 227  --  236   BMET:  Recent Labs  11/25/16 0336  11/25/16 1720 11/26/16 0427  NA 135  --  134* 133*  K 4.4  --  4.2 3.9  CL 107  --  98* 101  CO2 21*  --   --  23  GLUCOSE 94  --  149* 128*  BUN 12  --  13 13  CREATININE 0.74  < > 0.70 0.74  CALCIUM 8.1*  --   --  8.2*  < > = values in this interval not displayed.  PT/INR:  Recent Labs  11/24/16 1318  LABPROT 18.1*  INR 1.49   ABG    Component Value Date/Time   PHART 7.399 11/24/2016 1943   HCO3 23.0  11/24/2016 1943   TCO2 23 11/25/2016 1720   ACIDBASEDEF 2.0 11/24/2016 1943   O2SAT 96.0 11/24/2016 1943   CBG (last 3)   Recent Labs  11/25/16 2337 11/26/16 0254 11/26/16 0740  GLUCAP 144* 91 131*    Assessment/Plan: S/P Procedure(s) (LRB): CORONARY ARTERY BYPASS GRAFTING (CABG), ON PUMP, TIMES FOUR, USING LEFT INTERNAL MAMMARY ARTERY AND ENDOSCOPICALLY HARVESTED RIGHT GREATER SAPHENOUS VEIN (N/A) TRANSESOPHAGEAL ECHOCARDIOGRAM (TEE) (N/A) Mobilize Diuresis Diabetes control Plan for transfer to step-down: see transfer orders wean O2 Keep SAP < 140 mm Hg  LOS: 5 days    Kathlee NationsPeter Van Trigt III 11/26/2016

## 2016-11-26 NOTE — Progress Notes (Signed)
      [address]       [place] [id number]             [phone]      Resting comfortably  BP (!) 152/83   Pulse (!) 102   Temp 98.1 F (36.7 C) (Oral)   Resp (!) 24   Ht 5\' 8"  (1.727 m)   Wt 172 lb 8 oz (78.2 kg)   SpO2 95%   BMI 26.23 kg/m    Intake/Output Summary (Last 24 hours) at 11/26/16 1813 Last data filed at 11/26/16 1301  Gross per 24 hour  Intake            637.3 ml  Output             2300 ml  Net          -1662.7 ml   Awaiting bed on 4E  Willie Loy C. Dorris FetchHendrickson, MD [facility] [phone]

## 2016-11-27 ENCOUNTER — Inpatient Hospital Stay (HOSPITAL_COMMUNITY): Payer: Medicare Other

## 2016-11-27 LAB — TYPE AND SCREEN
ABO/RH(D): O POS
Antibody Screen: NEGATIVE
Unit division: 0
Unit division: 0
Unit division: 0
Unit division: 0

## 2016-11-27 LAB — BPAM RBC
Blood Product Expiration Date: 201808292359
Blood Product Expiration Date: 201808292359
Blood Product Expiration Date: 201809042359
Blood Product Expiration Date: 201809052359
ISSUE DATE / TIME: 201807310652
ISSUE DATE / TIME: 201808060958
ISSUE DATE / TIME: 201808060958
ISSUE DATE / TIME: 201808081136
Unit Type and Rh: 5100
Unit Type and Rh: 5100
Unit Type and Rh: 5100
Unit Type and Rh: 5100

## 2016-11-27 LAB — BASIC METABOLIC PANEL
Anion gap: 7 (ref 5–15)
BUN: 12 mg/dL (ref 6–20)
CO2: 25 mmol/L (ref 22–32)
Calcium: 8.5 mg/dL — ABNORMAL LOW (ref 8.9–10.3)
Chloride: 102 mmol/L (ref 101–111)
Creatinine, Ser: 0.78 mg/dL (ref 0.61–1.24)
GFR calc Af Amer: >60 mL/min (ref >60)
GFR calc non Af Amer: >60 mL/min (ref >60)
Glucose, Bld: 92 mg/dL (ref 65–99)
Potassium: 4.3 mmol/L (ref 3.5–5.1)
Sodium: 134 mmol/L — ABNORMAL LOW (ref 135–145)

## 2016-11-27 LAB — CBC
HCT: 29.2 % — ABNORMAL LOW (ref 39.0–52.0)
Hemoglobin: 10 g/dL — ABNORMAL LOW (ref 13.0–17.0)
MCH: 29.4 pg (ref 26.0–34.0)
MCHC: 34.2 g/dL (ref 30.0–36.0)
MCV: 85.9 fL (ref 78.0–100.0)
Platelets: 255 10*3/uL (ref 150–400)
RBC: 3.4 MIL/uL — ABNORMAL LOW (ref 4.22–5.81)
RDW: 14.1 % (ref 11.5–15.5)
WBC: 11.3 10*3/uL — ABNORMAL HIGH (ref 4.0–10.5)

## 2016-11-27 LAB — GLUCOSE, CAPILLARY
GLUCOSE-CAPILLARY: 85 mg/dL (ref 65–99)
GLUCOSE-CAPILLARY: 94 mg/dL (ref 65–99)
GLUCOSE-CAPILLARY: 89 mg/dL (ref 65–99)

## 2016-11-27 MED ORDER — WHITE PETROLATUM GEL
Status: AC
  Administered 2016-11-27: 23:00:00
  Filled 2016-11-27: qty 1

## 2016-11-27 NOTE — Plan of Care (Addendum)
Problem: Pain Management: Goal: Pain level will decrease Outcome: Progressing No pain at this time but will continue to monitor pain as pt intermittently in pain.

## 2016-11-27 NOTE — Progress Notes (Signed)
11/27/2016 1100 Received pt to room 4E-21 from 2H.  Pt is A&O, no c/o voiced at this time.  Tele monitor applied and CCMD notified.  Oriented to room, call light and bed.  Call bell in reach. Kathryne HitchAllen, Gailene Youkhana C

## 2016-11-27 NOTE — Progress Notes (Signed)
3 Days Post-Op Procedure(s) (LRB): CORONARY ARTERY BYPASS GRAFTING (CABG), ON PUMP, TIMES FOUR, USING LEFT INTERNAL MAMMARY ARTERY AND ENDOSCOPICALLY HARVESTED RIGHT GREATER SAPHENOUS VEIN (N/A) TRANSESOPHAGEAL ECHOCARDIOGRAM (TEE) (N/A) Subjective: Progressing 3 days after multivessel CABG Weaning oxygen as tolerated Chest x-ray clear Angulating hallway Blood pressure under good control Maintaining sinus rhythm  Objective: Vital signs in last 24 hours: Temp:  [98 F (36.7 C)-98.9 F (37.2 C)] 98 F (36.7 C) (08/09 1103) Pulse Rate:  [77-110] 82 (08/09 1100) Cardiac Rhythm: Normal sinus rhythm (08/09 1201) Resp:  [19-26] 23 (08/09 1100) BP: (107-163)/(65-123) 119/77 (08/09 1100) SpO2:  [90 %-100 %] 98 % (08/09 1100) Weight:  [167 lb 1.7 oz (75.8 kg)] 167 lb 1.7 oz (75.8 kg) (08/09 0500)  Hemodynamic parameters for last 24 hours:  stable  Intake/Output from previous day: 08/08 0701 - 08/09 0700 In: 431.3 [P.O.:360; I.V.:71.3] Out: 1745 [Urine:1745] Intake/Output this shift: Total I/O In: 120 [P.O.:120] Out: 75 [Urine:75]       Exam    General- alert and comfortable   Lungs- clear without rales, wheezes   Cor- regular rate and rhythm, no murmur , gallop   Abdomen- soft, non-tender   Extremities - warm, non-tender, minimal edema   Neuro- oriented, appropriate, no focal weakness   Lab Results:  Recent Labs  11/26/16 0427 11/27/16 0222  WBC 13.2* 11.3*  HGB 9.5* 10.0*  HCT 29.3* 29.2*  PLT 236 255   BMET:  Recent Labs  11/26/16 0427 11/27/16 0222  NA 133* 134*  K 3.9 4.3  CL 101 102  CO2 23 25  GLUCOSE 128* 92  BUN 13 12  CREATININE 0.74 0.78  CALCIUM 8.2* 8.5*    PT/INR: No results for input(s): LABPROT, INR in the last 72 hours. ABG    Component Value Date/Time   PHART 7.399 11/24/2016 1943   HCO3 23.0 11/24/2016 1943   TCO2 23 11/25/2016 1720   ACIDBASEDEF 2.0 11/24/2016 1943   O2SAT 96.0 11/24/2016 1943   CBG (last 3)   Recent Labs  11/27/16 0738 11/27/16 1212 11/27/16 1628  GLUCAP 85 94 89    Assessment/Plan: S/P Procedure(s) (LRB): CORONARY ARTERY BYPASS GRAFTING (CABG), ON PUMP, TIMES FOUR, USING LEFT INTERNAL MAMMARY ARTERY AND ENDOSCOPICALLY HARVESTED RIGHT GREATER SAPHENOUS VEIN (N/A) TRANSESOPHAGEAL ECHOCARDIOGRAM (TEE) (N/A) Mobilize Diuresis Plan for transfer to step-down: see transfer orders Wean oxygen as tolerated   LOS: 6 days    Kathlee Nationseter Van Trigt III 11/27/2016

## 2016-11-27 NOTE — Progress Notes (Signed)
CARDIAC REHAB PHASE I   PRE:  Rate/Rhythm: 83 SR    BP: sitting 119/77    SaO2: 97 1L  MODE:  Ambulation: 800 ft   POST:  Rate/Rhythm: 96 SR    BP: sitting 133/79     SaO2: 99 RA  Pt feeling well on 1L. He walked 420 ft with RW and 1L. SaO2 98 1L, felt well, stating he didn't need RW. Pt then ambulated 380 ft without RW or O2. SaO2 99 RA after walk, feels well. Steady without RW. To recliner facing the sunshine. Encouraged x1 more walk with family. Will f/u tomorrow for education. [phone]   Harriet MassonRandi Kristan Rahaf Carbonell CES, ACSM 11/27/2016 11:37 AM

## 2016-11-28 LAB — GLUCOSE, CAPILLARY
GLUCOSE-CAPILLARY: 81 mg/dL (ref 65–99)
GLUCOSE-CAPILLARY: 66 mg/dL (ref 65–99)
GLUCOSE-CAPILLARY: 80 mg/dL (ref 65–99)
Glucose-Capillary: 106 mg/dL — ABNORMAL HIGH (ref 65–99)
Glucose-Capillary: 92 mg/dL (ref 65–99)
Glucose-Capillary: 188 mg/dL — ABNORMAL HIGH (ref 65–99)

## 2016-11-28 NOTE — Progress Notes (Signed)
11/28/2016 1030 EPW D/C'd per order and per protocol.  Ends intact. Pt. Tolerated well.  Advised bedrest x1hr.  Call bell in reach.  Vital signs collected per protocol. Kathryne HitchAllen, Sekou Zuckerman C

## 2016-11-28 NOTE — Discharge Summary (Signed)
Physician Discharge Summary  Patient ID: Jesus Curtis MRN: [id number] DOB/AGE: [birth year] 76 y.o.  Admit date: 11/19/2016 Discharge date: 11/29/2016  Admission Diagnoses:Elevated Troponin  Discharge Diagnoses:  Active Problems:   Chest pain   Elevated troponin   Nausea and vomiting   Pre-syncope   Essential hypertension   Abnormal nuclear stress test   S/P CABG x 4  Patient Active Problem List   Diagnosis Date Noted  . S/P CABG x 4 11/24/2016  . Abnormal nuclear stress test   . Elevated troponin   . Nausea and vomiting   . Pre-syncope   . Essential hypertension   . Chest pain 11/19/2016   HPI: Jesus Curtis is a 76 year old man without significant medical history who is admitted as a transfer from [facility] for an elevated troponin. Initial report had been that the patient had chest pain, but to me the patient denies any clear chest pain events. He states that he had shoulder pain for about a week, and 5-6 days ago he began feeling cold. He developed nausea and vomiting 5 days ago, with progressed over the weekend. He states he hasn't had much to eat or drink in a week. 3 days ago while at work, he felt poorly and had a near syncopal episode while working. He does not remember much before or after the event, but it was witnessed with no LOC and no injury. He denies chest pain, shortness of breath, or diaphoresis. He feels fatigued.  His PMH is notable for a snake bite in childhood. He denies current medical conditions and does not see a doctor routinely. He is on no medications, takes only a multivitamin. Had an adverse reaction to ativan but no other allergies.  His family history is notable for MI in his mother, brother, and father.  He is a distant smoker (1 pack/week about 50 years ago, for a very short time). No alcohol or illicits. Very active in daily life.    Discharged Condition: good  Hospital Course: the patient was admitted in transfer from [facility] for  further management.He was seen by cardiology. His troponins were cycled.troponins at [facility] were negative.He was felt to require further management and diagnostic work-up  to include cardiology evaluation including stress testing and  cardiac catheterization.he had an episode of SVT an echocardiogram was obtained.his nuclear stress test showed an area of mild severity with a reversible defect in the basal and mid inferoseptal, inferior and inferolateral and apical inferior walls. Due to these findings cardiac catheterization was recommended as more definitive testing and this was done on 11/21/2016.the patient was found to have severe multivessel coronary artery disease in CT surgical consultation was obtained. He was felt to be a candidate for coronary artery surgical revascularization and on 11/24/2016 he was taken to the operating room where he underwent the below described procedure.he tolerated well was taken to the surgical intensive care unit in stable condition.  Postoperative hospital course:  Overall, the patient has progressed quite nicely.he was extubated without difficulty using standard protocols.he does have an expected acute blood loss anemia which is stable.He does have some postoperative volume overload but is responding well to diuretics. Oxygen has been weaned and he maintains good saturations on room air.renal function is within normal limits. Blood sugars have been under good control.he doesn't have a previous history of diabetes but his A1c is 6.7 so he will need outpatient management limiting good nutrition. He was instructed to obtain a medical doctor in order  to have further surveillance of his HGA1C.  Incisions are healing well without evidence of infection. He is tolerating diet. He is tolerating gradually increasing activities using standard protocols and is on room air. His pacemaker wires have been removed. Chest tube sutures will be removed today. At time of discharge, he is felt to  be quite stable.  Consults: cardiology  Significant Diagnostic Studies: angiography: cardiac cath EXAM: CHEST  2 VIEW  COMPARISON:  November 26, 2016  FINDINGS: Cordis has been removed. Temporary pacemaker wires remain attached to the right heart. No pneumothorax. There is a small right pleural effusion. There is patchy atelectasis in the left lower lobe and in the medial right base. Lungs elsewhere clear. Heart remains mildly enlarged with pulmonary vascularity within normal limits. There is aortic atherosclerosis. No adenopathy. No evident bone lesions.  IMPRESSION: No pneumothorax. Patchy atelectasis in the medial bases and left lower lobe regions. Lungs elsewhere clear. Stable cardiac prominence. There is aortic atherosclerosis.  Aortic Atherosclerosis (ICD10-I70.0).   Electronically Signed   By: Bretta Bang III M.D.   On: 11/27/2016 08:09  Treatments: surgery:  DATE OF PROCEDURE:  11/24/2016 DATE OF DISCHARGE:                              OPERATIVE REPORT   OPERATIONS: 1. Coronary artery bypass grafting x4 (left internal mammary artery to     left anterior descending, saphenous vein graft to diagonal,     saphenous vein graft to circumflex marginal, saphenous vein graft     to posterior descending branch of the distal circumflex). 2. Endoscopic harvest of right leg greater saphenous vein.  SURGEON:  Kerin Perna, M.D.  ASSISTANT:  Jari Favre, P.A.-C.  ANESTHESIA:  General by Dr. Marcene Duos.  CLINICAL DATA:  Coronary artery disease, status post coronary artery bypass grafting.    Discharge Exam: Blood pressure 123/90, pulse (!) 106, temperature 98.1 F (36.7 C), temperature source Oral, resp. rate (!) 23, height 5\' 8"  (1.727 m), weight 75.6 kg (166 lb 9.6 oz), SpO2 90 %.  Cardiovascular: Slightly tachy Pulmonary: Slightly diminished at bases Abdomen: Soft, non tender, bowel sounds present. Extremities:No lower extremity  edema. Wounds: Clean and dry.  No erythema or signs of infection.  Disposition: discharged home  Allergies as of 11/29/2016      Reactions   Ativan [lorazepam] Other (See Comments)   "goes wild"      Medication List    TAKE these medications   acetaminophen 500 MG tablet Commonly known as:  TYLENOL Take 1,000 mg by mouth every 6 (six) hours as needed for mild pain.   aspirin EC 325 MG tablet Take 325 mg by mouth daily.   atorvastatin 80 MG tablet Commonly known as:  LIPITOR Take 1 tablet (80 mg total) by mouth daily at 6 PM.   GINSENG PO Take 2 tablets by mouth daily.   lisinopril 10 MG tablet Commonly known as:  PRINIVIL,ZESTRIL Take 1 tablet (10 mg total) by mouth daily.   metoprolol tartrate 50 MG tablet Commonly known as:  LOPRESSOR Take 1 tablet (50 mg total) by mouth 2 (two) times daily.   ONE-A-DAY MENS PO Take 1 tablet by mouth daily.   traMADol 50 MG tablet Commonly known as:  ULTRAM Take 50 mg by mouth every 4-6 hours PRN moderate to severe pain.      Follow-up Information    Lars Masson, MD Follow up.  Specialty:  Cardiology Why:  please see discharge paperwork for a two-week cardiologist follow-up appointment. Contact information: [address] [place] [address] [phone]        Kerin PernaVan Trigt, Peter, MD Follow up.   Specialty:  Cardiothoracic Surgery Why:  appointment to see surgeon on 12/31/2016 at 11:30 AM. [facility]. [facility] is located in the same office complex. Contact information: [address] [place] [address] [phone]        Medical doctor Follow up.   Why:  Please obtain a medical doctor for further surveillance of HGA1C 6.7         The patient has been discharged on:   1.Beta Blocker:  Yes [place].Etienne[y   ]                              No   [   ]                              If No, reason:  2.Ace Inhibitor/ARB: Yes [ y   ]                                     No  [    ]                                     If No, reason:  3.Statin:   Yes [ y  ]                  No  [   ]                  If No, reason:  4.Ecasa:  Yes  [  y ]                  No   [   ]                  If No, reason:  Signed: ZIMMERMAN,DONIELLE M PA-C 11/29/2016, 10:45 AM

## 2016-11-28 NOTE — Progress Notes (Signed)
Inpatient Diabetes Program Recommendations  AACE/ADA: New Consensus Statement on Inpatient Glycemic Control (2015)  Target Ranges:  Prepandial:   less than 140 mg/dL      Peak postprandial:   less than 180 mg/dL (1-2 hours)      Critically ill patients:  140 - 180 mg/dL   Results for Jesus Curtis, [name] (MRN [id number]) as of 11/28/2016 07:55  Ref. Range 11/27/2016 07:38 11/27/2016 12:12 11/27/2016 16:28 11/27/2016 21:33 11/28/2016 06:07 11/28/2016 06:58  Glucose-Capillary Latest Ref Range: 65 - 99 mg/dL 85 94 89 81 66 [id number] (H)   Review of Glycemic Control  Current orders for Inpatient glycemic control: Levemir 10 units BID, Novolog 0-24 units ACHS  Inpatient Diabetes Program Recommendations: Insulin - Basal: Patient only received Levemir 10 units yesterday morning (did NOT receive evening dose). Fasting glucose 66 mg/dl this morning. Please consider decreasing or discontinuing Levemir at this time.  Thanks, Orlando PennerMarie Maecie Sevcik, RN, MSN, CDE Diabetes Coordinator Inpatient Diabetes Program [phone] (Team Pager from 8am to 5pm)

## 2016-11-28 NOTE — Discharge Instructions (Signed)
Endoscopic Saphenous Vein Harvesting, Care After °Refer to this sheet in the next few weeks. These instructions provide you with information about caring for yourself after your procedure. Your health care provider may also give you more specific instructions. Your treatment has been planned according to current medical practices, but problems sometimes occur. Call your health care provider if you have any problems or questions after your procedure. °What can I expect after the procedure? °After the procedure, it is common to have: °· Pain. °· Bruising. °· Swelling. °· Numbness. ° °Follow these instructions at home: °Medicine °· Take over-the-counter and prescription medicines only as told by your health care provider. °· Do not drive or operate heavy machinery while taking prescription pain medicine. °Incision care ° °· Follow instructions from your health care provider about how to take care of the cut made during surgery (incision). Make sure you: °? Wash your hands with soap and water before you change your bandage (dressing). If soap and water are not available, use hand sanitizer. °? Change your dressing as told by your health care provider. °? Leave stitches (sutures), skin glue, or adhesive strips in place. These skin closures may need to be in place for 2 weeks or longer. If adhesive strip edges start to loosen and curl up, you may trim the loose edges. Do not remove adhesive strips completely unless your health care provider tells you to do that. °· Check your incision area every day for signs of infection. Check for: °? More redness, swelling, or pain. °? More fluid or blood. °? Warmth. °? Pus or a bad smell. °General instructions °· Raise (elevate) your legs above the level of your heart while you are sitting or lying down. °· Do any exercises your health care providers have given you. These may include deep breathing, coughing, and walking exercises. °· Do not shower, take baths, swim, or use a hot tub  unless told by your health care provider. °· Wear your elastic stocking if told by your health care provider. °· Keep all follow-up visits as told by your health care provider. This is important. °Contact a health care provider if: °· Medicine does not help your pain. °· Your pain gets worse. °· You have new leg bruises or your leg bruises get bigger. °· You have a fever. °· Your leg feels numb. °· You have more redness, swelling, or pain around your incision. °· You have more fluid or blood coming from your incision. °· Your incision feels warm to the touch. °· You have pus or a bad smell coming from your incision. °Get help right away if: °· Your pain is severe. °· You develop pain, tenderness, warmth, redness, or swelling in any part of your leg. °· You have chest pain. °· You have trouble breathing. °This information is not intended to replace advice given to you by your health care provider. Make sure you discuss any questions you have with your health care provider. °Document Released: 12/18/2010 Document Revised: 09/13/2015 Document Reviewed: 02/19/2015 °Elsevier Interactive Patient Education © 2018 [employer] °Coronary Artery Bypass Grafting, Care After °These instructions give you information on caring for yourself after your procedure. Your doctor may also give you more specific instructions. Call your doctor if you have any problems or questions after your procedure. °Follow these instructions at home: °· Only take medicine as told by your doctor. Take medicines exactly as told. Do not stop taking medicines or start any new medicines without talking to your doctor first. °·   Take your pulse as told by your doctor. °· Do deep breathing as told by your doctor. Use your breathing device (incentive spirometer), if given, to practice deep breathing several times a day. Support your chest with a pillow or your arms when you take deep breaths or cough. °· Keep the area clean, dry, and protected where the  surgery cuts (incisions) were made. Remove bandages (dressings) only as told by your doctor. If strips were applied to surgical area, do not take them off. They fall off on their own. °· Check the surgery area daily for puffiness (swelling), redness, or leaking fluid. °· If surgery cuts were made in your legs: °? Avoid crossing your legs. °? Avoid sitting for long periods of time. Change positions every 30 minutes. °? Raise your legs when you are sitting. Place them on pillows. °· Wear stockings that help keep blood clots from forming in your legs (compression stockings). °· Only take sponge baths until your doctor says it is okay to take showers. Pat the surgery area dry. Do not rub the surgery area with a washcloth or towel. Do not bathe, swim, or use a hot tub until your doctor says it is okay. °· Eat foods that are high in fiber. These include raw fruits and vegetables, whole grains, beans, and nuts. Choose lean meats. Avoid canned, processed, and fried foods. °· Drink enough fluids to keep your pee (urine) clear or pale yellow. °· Weigh yourself every day. °· Rest and limit activity as told by your doctor. You may be told to: °? Stop any activity if you have chest pain, shortness of breath, changes in heartbeat, or dizziness. Get help right away if this happens. °? Move around often for short amounts of time or take short walks as told by your doctor. Gradually become more active. You may need help to strengthen your muscles and build endurance. °? Avoid lifting, pushing, or pulling anything heavier than 10 pounds (4.5 kg) for at least 6 weeks after surgery. °· Do not drive until your doctor says it is okay. °· Ask your doctor when you can go back to work. °· Ask your doctor when you can begin sexual activity again. °· Follow up with your doctor as told. °Contact a doctor if: °· You have puffiness, redness, more pain, or fluid draining from the incision site. °· You have a fever. °· You have puffiness in your  ankles or legs. °· You have pain in your legs. °· You gain 2 or more pounds (0.9 kg) a day. °· You feel sick to your stomach (nauseous) or throw up (vomit). °· You have watery poop (diarrhea). °Get help right away if: °· You have chest pain that goes to your jaw or arms. °· You have shortness of breath. °· You have a fast or irregular heartbeat. °· You notice a "clicking" in your breastbone when you move. °· You have numbness or weakness in your arms or legs. °· You feel dizzy or light-headed. °This information is not intended to replace advice given to you by your health care provider. Make sure you discuss any questions you have with your health care provider. °Document Released: 04/12/2013 Document Revised: 09/13/2015 Document Reviewed: 09/14/2012 °Elsevier Interactive Patient Education © 2017 [employer] Inc. ° °

## 2016-11-28 NOTE — Care Management Important Message (Signed)
Important Message  Patient Details  Name: Charlane FerrettiJohn Kauer MRN: [id number] Date of Birth: 11/22/1940   Medicare Important Message Given:  Yes    Kyla BalzarineShealy, Jackye Dever Abena 11/28/2016, 9:36 AM

## 2016-11-28 NOTE — Progress Notes (Signed)
CARDIAC REHAB PHASE I   PRE:  Rate/Rhythm: 83 SR    BP: sitting 125/90    SaO2: 98 RA  MODE:  Ambulation: 790 ft   POST:  Rate/Rhythm: 103 ST    BP: sitting 148/103     SaO2: 100 RA  Tolerated well, no c/o, moving independently without c/o. BP elevated. Pt has not seen a MD for wellness since 1962. Discussed his A1C. He seems to not believe me and is reluctant to get a PCP. He agrees that education with his wife will be best so I left diet sheets and will educate tomorrow. He declined watching videos. [phone]   Jesus MassonRandi Kristan Winda Curtis CES, ACSM 11/28/2016 12:20 PM

## 2016-11-28 NOTE — Progress Notes (Signed)
[address]       [place] [address]             [phone]      4 Days Post-Op Procedure(s) (LRB): CORONARY ARTERY BYPASS GRAFTING (CABG), ON PUMP, TIMES FOUR, USING LEFT INTERNAL MAMMARY ARTERY AND ENDOSCOPICALLY HARVESTED RIGHT GREATER SAPHENOUS VEIN (N/A) TRANSESOPHAGEAL ECHOCARDIOGRAM (TEE) (N/A) Subjective: Feels well, no specific c/o  Objective: Vital signs in last 24 hours: Temp:  [98 F (36.7 C)-98.3 F (36.8 C)] 98.1 F (36.7 C) (08/10 0400) Pulse Rate:  [70-109] 109 (08/10 0400) Cardiac Rhythm: Normal sinus rhythm (08/10 0712) Resp:  [19-26] 21 (08/10 0400) BP: (99-145)/(74-104) 144/74 (08/10 0400) SpO2:  [90 %-98 %] 96 % (08/10 0400) Weight:  [164 lb 3.9 oz (74.5 kg)] 164 lb 3.9 oz (74.5 kg) (08/10 0140)  Hemodynamic parameters for last 24 hours:    Intake/Output from previous day: 08/09 0701 - 08/10 0700 In: 260 [P.O.:260] Out: 75 [Urine:75] Intake/Output this shift: No intake/output data recorded.  General appearance: alert, cooperative and no distress Heart: regular rate and rhythm Lungs: clear to auscultation bilaterally Abdomen: benign Extremities: no edema Wound: incis healing well  Lab Results:  Recent Labs  11/26/16 0427 11/27/16 0222  WBC 13.2* 11.3*  HGB 9.5* 10.0*  HCT 29.3* 29.2*  PLT 236 255   BMET:  Recent Labs  11/26/16 0427 11/27/16 0222  NA 133* 134*  K 3.9 4.3  CL 101 102  CO2 23 25  GLUCOSE 128* 92  BUN 13 12  CREATININE 0.74 0.78  CALCIUM 8.2* 8.5*    PT/INR: No results for input(s): LABPROT, INR in the last 72 hours. ABG    Component Value Date/Time   PHART 7.399 11/24/2016 1943   HCO3 23.0 11/24/2016 1943   TCO2 23 11/25/2016 1720   ACIDBASEDEF 2.0 11/24/2016 1943   O2SAT 96.0 11/24/2016 1943   CBG (last 3)   Recent Labs  11/27/16 2133 11/28/16 0607 11/28/16 0658  GLUCAP 81 66 106*    Meds Scheduled Meds: . acetaminophen  1,000 mg Oral Q6H   Or  . acetaminophen  (TYLENOL) oral liquid 160 mg/5 mL  1,000 mg Per Tube Q6H  . aspirin EC  325 mg Oral Daily   Or  . aspirin  324 mg Per Tube Daily  . atorvastatin  80 mg Oral q1800  . bisacodyl  10 mg Oral Daily   Or  . bisacodyl  10 mg Rectal Daily  . docusate sodium  200 mg Oral Daily  . furosemide  40 mg Oral Daily  . insulin aspart  0-24 Units Subcutaneous TID AC & HS  . insulin detemir  10 Units Subcutaneous BID  . lisinopril  10 mg Oral Daily  . metoprolol tartrate  50 mg Oral BID  . pantoprazole  40 mg Oral Daily  . potassium chloride  20 mEq Oral BID  . sodium chloride flush  3 mL Intravenous Q12H  . sodium chloride flush  3 mL Intravenous Q12H   Continuous Infusions: . sodium chloride 10 mL/hr at 11/25/16 2000  . sodium chloride    . sodium chloride 20 mL/hr at 11/25/16 0800  . sodium chloride    . nitroGLYCERIN Stopped (11/26/16 1301)   PRN Meds:.sodium chloride, sodium chloride, magnesium hydroxide, metoprolol tartrate, ondansetron (ZOFRAN) IV, oxyCODONE, sodium chloride flush, sodium chloride flush, traMADol  Xrays Dg Chest 2 View  Result Date: 11/27/2016 CLINICAL DATA:  Coronary artery disease, status post coronary artery  bypass grafting. EXAM: CHEST  2 VIEW COMPARISON:  November 26, 2016 FINDINGS: Cordis has been removed. Temporary pacemaker wires remain attached to the right heart. No pneumothorax. There is a small right pleural effusion. There is patchy atelectasis in the left lower lobe and in the medial right base. Lungs elsewhere clear. Heart remains mildly enlarged with pulmonary vascularity within normal limits. There is aortic atherosclerosis. No adenopathy. No evident bone lesions. IMPRESSION: No pneumothorax. Patchy atelectasis in the medial bases and left lower lobe regions. Lungs elsewhere clear. Stable cardiac prominence. There is aortic atherosclerosis. Aortic Atherosclerosis (ICD10-I70.0). Electronically Signed   By: Bretta BangWilliam  Woodruff III M.D.   On: 11/27/2016 08:09     Assessment/Plan: S/P Procedure(s) (LRB): CORONARY ARTERY BYPASS GRAFTING (CABG), ON PUMP, TIMES FOUR, USING LEFT INTERNAL MAMMARY ARTERY AND ENDOSCOPICALLY HARVESTED RIGHT GREATER SAPHENOUS VEIN (N/A) TRANSESOPHAGEAL ECHOCARDIOGRAM (TEE) (N/A)  1 doing well 2 d/c epw's today 3 cont to push pulm toilet/rehab- routine 4 hemodyn stable in sinus rhythm 5 no new labs 6 prob home in am  LOS: 7 days    Lois Ostrom E 11/28/2016

## 2016-11-29 LAB — GLUCOSE, CAPILLARY: Glucose-Capillary: 138 mg/dL — ABNORMAL HIGH (ref 65–99)

## 2016-11-29 MED ORDER — TRAMADOL HCL 50 MG PO TABS: ORAL_TABLET | ORAL | 0 refills | Status: DC

## 2016-11-29 MED ORDER — LISINOPRIL 10 MG PO TABS: 10.0000 mg | ORAL_TABLET | Freq: Every day | ORAL | 1 refills | Status: DC

## 2016-11-29 MED ORDER — METOPROLOL TARTRATE 50 MG PO TABS: 50.0000 mg | ORAL_TABLET | Freq: Two times a day (BID) | ORAL | 1 refills | Status: DC

## 2016-11-29 MED ORDER — ATORVASTATIN CALCIUM 80 MG PO TABS: 80.0000 mg | ORAL_TABLET | Freq: Every day | ORAL | 1 refills | Status: DC

## 2016-11-29 NOTE — Progress Notes (Signed)
11/29/2016 1200 Discharge AVS meds taken today and those due this evening reviewed.  Follow-up appointments and when to call md reviewed.  D/C IV and TELE.  Questions and concerns addressed.   D/C home per orders. Kathryne HitchAllen, Licet Dunphy C

## 2016-11-29 NOTE — Progress Notes (Signed)
11/29/2016 1130 Chest tube sutures removed. Kathryne HitchAllen, Merial Moritz C

## 2016-11-29 NOTE — Progress Notes (Addendum)
      [address]       [place] [id number]             [phone]        5 Days Post-Op Procedure(s) (LRB): CORONARY ARTERY BYPASS GRAFTING (CABG), ON PUMP, TIMES FOUR, USING LEFT INTERNAL MAMMARY ARTERY AND ENDOSCOPICALLY HARVESTED RIGHT GREATER SAPHENOUS VEIN (N/A) TRANSESOPHAGEAL ECHOCARDIOGRAM (TEE) (N/A)  Subjective: Patient states ready to go home.  Objective: Vital signs in last 24 hours: Temp:  [98 F (36.7 C)-98.1 F (36.7 C)] 98.1 F (36.7 C) (08/11 0428) Pulse Rate:  [76-104] 84 (08/11 0428) Cardiac Rhythm: Normal sinus rhythm (08/10 2100) Resp:  [15-26] 23 (08/11 0428) BP: (113-171)/(62-85) 113/62 (08/11 0428) SpO2:  [90 %-98 %] 90 % (08/11 0428) Weight:  [75.6 kg (166 lb 9.6 oz)] 75.6 kg (166 lb 9.6 oz) (08/11 [id number])  Pre op weight 75.7 kg Current Weight  11/29/16 75.6 kg (166 lb 9.6 oz)      Intake/Output from previous day: 08/10 0701 - 08/11 0700 In: 887.8 [P.O.:240; I.V.:647.8] Out: 150 [Urine:150]   Physical Exam:  Cardiovascular: Slightly tachy Pulmonary: Slightly diminished at bases Abdomen: Soft, non tender, bowel sounds present. Extremities:No lower extremity edema. Wounds: Clean and dry.  No erythema or signs of infection.  Lab Results: CBC: Recent Labs  11/27/16 0222  WBC 11.3*  HGB 10.0*  HCT 29.2*  PLT 255   BMET:  Recent Labs  11/27/16 0222  NA 134*  K 4.3  CL 102  CO2 25  GLUCOSE 92  BUN 12  CREATININE 0.78  CALCIUM 8.5*    PT/INR:  Lab Results  Component Value Date   INR 1.49 11/24/2016   INR 1.16 11/24/2016   INR 1.29 11/22/2016   ABG:  INR: Will add last result for INR, ABG once components are confirmed Will add last 4 CBG results once components are confirmed  Assessment/Plan:  1. CV - Slightly tachy in the low 100's this am. On Lopressor 50 mg bid, Lisinopril 10 mg daily. 2.  Pulmonary - On room air. Encourage incentive spirometer. 3. Volume Overload - On Lasix 40 mg daily. 4.   Acute blood loss anemia - Last H and H 10 and 29.2 5. Remove sutures 6. CBGs 92/188/138. Pre op HGA1C 6.7. I informed patient and wife that he needs to obtain a medical doctor and have further surveillance of his HGA1C as it appears he may have newly diagnosed diabetes. Advised to avoid sugar, processed foods high in carbohydrates, pastas, potatoes etc 7. Discharge  Selwyn Reason MPA-C 11/29/2016,9:12 AM

## 2016-11-29 NOTE — Progress Notes (Signed)
Cardiology note:  The patient is being discharged, we will arrange for an outpatient follow up.  Tobias AlexanderKatarina Aadan Chenier, MD 11/29/2016

## 2016-11-29 NOTE — Progress Notes (Addendum)
Ed completed with pt and wife. Voiced understanding although still seems reluctant to follow up with PCP. Will refer to [facility] although he does not seem interested. 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 [id number] 11:17 AM 11/29/2016 Ethelda ChickKristan Leavy Heatherly CES, ACSM

## 2016-11-29 NOTE — Care Management Note (Signed)
Case Management Note  Patient Details  Name: Jesus FerrettiJohn Curtis MRN: [id number] Date of Birth: 06/04/1940  Subjective/Objective:                 Patient with order to DC to home today. Chart reviewed. No Home Health or Equipment needs, no unacknowledged Case Management consults or medication needs identified at the time of this note. Plan for DC to home. If needs arise today prior to discharge, please call Lawerance SabalDebbie Jennea Rager RN CM at [phone].    Action/Plan:   Expected Discharge Date:  11/29/16               Expected Discharge Plan:  Home/Self Care  In-House Referral:     Discharge planning Services  CM Consult  Post Acute Care Choice:    Choice offered to:     DME Arranged:    DME Agency:     HH Arranged:    HH Agency:     Status of Service:  In process, will continue to follow  If discussed at Long Length of Stay Meetings, dates discussed:    Additional Comments:  Lawerance SabalDebbie Maniya Donovan, RN 11/29/2016, 10:13 AM

## 2016-12-16 ENCOUNTER — Ambulatory Visit (INDEPENDENT_AMBULATORY_CARE_PROVIDER_SITE_OTHER): Payer: Medicare Other | Admitting: Physician Assistant

## 2016-12-16 VITALS — BP 140/80 | HR 77 | Ht 68.0 in | Wt 164.0 lb

## 2016-12-16 DIAGNOSIS — R7303 Prediabetes: Secondary | ICD-10-CM | POA: Insufficient documentation

## 2016-12-16 DIAGNOSIS — I10 Essential (primary) hypertension: Secondary | ICD-10-CM | POA: Diagnosis not present

## 2016-12-16 DIAGNOSIS — Z951 Presence of aortocoronary bypass graft: Secondary | ICD-10-CM

## 2016-12-16 NOTE — Patient Instructions (Signed)
Medication Instructions:  Your physician recommends that you continue on your current medications as directed. Please refer to the Current Medication list given to you today.   Labwork: -None  Testing/Procedures: -None  Follow-Up: Your physician recommends that you keep your scheduled  follow-up appointment with Dr. Wyline Mood in [place].    Any Other Special Instructions Will Be Listed Below (If Applicable).     If you need a refill on your cardiac medications before your next appointment, please call your pharmacy.

## 2016-12-16 NOTE — Progress Notes (Signed)
Cardiology Office Note    Date:  12/16/2016   ID:  Jesus Curtis, DOB 04-15-1941, MRN [id number]  PCP:  Patient, No Pcp Per To see Dr. At [facility] Cardiologist: Dr. Wyline Mood at [facility] requests-they live in [facility] Complaint  Patient presents with  . Follow-up    History of Present Illness:  Jesus Curtis is a 76 y.o. male found to have three-vessel CAD and low normal LVEF 50% with mild mid-to basal inferior hypocontractility cardiac cath 11/23/16. He underwent CABG 4 with a LIMA to the LAD, SVG to the diagonal, SVG to the circumflex marginal and SVG to the PDA 11/24/16. No previous history of diabetes but his hemoglobin A1c was 6.7.  Patient comes in today accompanied by his wife and daughter for post hospital follow-up. He is walking a mile a day and feels great. 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 He denies any chest pain, palpitations, dyspnea, dyspnea on exertion, dizziness or presyncope.   Past Medical History:  Diagnosis Date  . Abnormal stress test   . Aortic atherosclerosis (HCC)   . Chest pain   . Elevated hemoglobin A1c   . Elevated troponin   . Hypertension   . Nausea & vomiting   . Snake bite    AS A CHILD HAD PMH  . SVT (supraventricular tachycardia) (HCC)   . Syncope     Past Surgical History:  Procedure Laterality Date  . CORONARY ARTERY BYPASS GRAFT N/A 11/24/2016   Procedure: CORONARY ARTERY BYPASS GRAFTING (CABG), ON PUMP, TIMES FOUR, USING LEFT INTERNAL MAMMARY ARTERY AND ENDOSCOPICALLY HARVESTED RIGHT GREATER SAPHENOUS VEIN;  Surgeon: Kerin Perna, MD;  Location: [facility] OR;  Service: Open Heart Surgery;  Laterality: N/A;  LIMA to LAD SVG to PDA SVG to OM1 SVG to Diag1  . LEFT HEART CATH AND CORONARY ANGIOGRAPHY N/A 11/21/2016   Procedure: LEFT HEART CATH AND CORONARY ANGIOGRAPHY;  Surgeon: Lennette Bihari, MD;  Location: [facility] INVASIVE CV LAB;  Service: Cardiovascular;   Laterality: N/A;  . TEE WITHOUT CARDIOVERSION N/A 11/24/2016   Procedure: TRANSESOPHAGEAL ECHOCARDIOGRAM (TEE);  Surgeon: Donata Clay, Theron Arista, MD;  Location: [facility] OR;  Service: Open Heart Surgery;  Laterality: N/A;    Current Medications: Current Meds  Medication Sig  . acetaminophen (TYLENOL) 500 MG tablet Take 1,000 mg by mouth every 6 (six) hours as needed for mild pain.  Marland Kitchen aspirin EC 325 MG tablet Take 325 mg by mouth daily.  Marland Kitchen atorvastatin (LIPITOR) 80 MG tablet Take 1 tablet (80 mg total) by mouth daily at 6 PM.  . GINSENG PO Take 2 tablets by mouth daily.  Marland Kitchen lisinopril (PRINIVIL,ZESTRIL) 10 MG tablet Take 1 tablet (10 mg total) by mouth daily.  . metoprolol tartrate (LOPRESSOR) 50 MG tablet Take 1 tablet (50 mg total) by mouth 2 (two) times daily.  . Multiple Vitamin (ONE-A-DAY MENS PO) Take 1 tablet by mouth daily.  . traMADol (ULTRAM) 50 MG tablet Take 50 mg by mouth every 4-6 hours PRN moderate to severe pain.     Allergies:   Ativan [lorazepam]   Social History   Social History  . Marital status: Unknown    Spouse name: N/A  . Number of children: N/A  . Years of education: N/A   Social History Main Topics  . Smoking status: Never Smoker  . Smokeless tobacco: Never Used  . Alcohol use No  . Drug  use: No  . Sexual activity: Not Asked   Other Topics Concern  . None   Social History Narrative  . None     Family History:  The patient's   family history includes Heart attack in his brother, father, and mother.   ROS:   Please see the history of present illness.    Review of Systems  Constitution: Positive for decreased appetite and weakness.  HENT: Negative.   Cardiovascular: Negative.   Respiratory: Positive for cough.   Endocrine: Negative.   Hematologic/Lymphatic: Negative.   Musculoskeletal: Negative.   Gastrointestinal: Negative.   Genitourinary: Negative.    All other systems reviewed and are negative.   PHYSICAL EXAM:   VS:  BP 140/80   Pulse 77   Ht  5\' 8"  (1.727 m)   Wt 164 lb (74.4 kg)   BMI 24.94 kg/m   Physical Exam  GEN: Well nourished, well developed, in no acute distress  Neck: no JVD, carotid bruits, or masses Cardiac:RRR; no murmurs, rubs, or gallops  Respiratory:  clear to auscultation bilaterally, normal work of breathing GI: soft, nontender, nondistended, + BS Ext: without cyanosis, clubbing, or edema, Good distal pulses bilaterally Neuro:  Alert and Oriented x 3 Psych: euthymic mood, full affect  Wt Readings from Last 3 Encounters:  12/16/16 164 lb (74.4 kg)  11/29/16 166 lb 9.6 oz (75.6 kg)      Studies/Labs Reviewed:   EKG:  EKG is  ordered today.  The ekg ordered today demonstratesNormal sinus rhythm, no acute change  Recent Labs: 11/19/2016: B Natriuretic Peptide 15.5 11/22/2016: ALT 64; TSH 2.389 11/25/2016: Magnesium 2.2 11/27/2016: BUN 12; Creatinine, Ser 0.78; Hemoglobin 10.0; Platelets 255; Potassium 4.3; Sodium 134   Lipid Panel    Component Value Date/Time   CHOL 104 11/20/2016 0321   TRIG 151 (H) 11/20/2016 0321   HDL 10 (L) 11/20/2016 0321   CHOLHDL 10.4 11/20/2016 0321   VLDL 30 11/20/2016 0321   LDLCALC 64 11/20/2016 0321    Additional studies/ records that were reviewed today include:   Cath 11/21/2016 Conclusion       LM lesion, 30 %stenosed.  Ost LAD to Prox LAD lesion, 30 %stenosed.  Mid LAD lesion, 90 %stenosed.  1st Mrg lesion, 50 %stenosed.  Ost Cx to Prox Cx lesion, 70 %stenosed.  Prox Cx to Dist Cx lesion, 70 %stenosed.  Ost RCA to Prox RCA lesion, 80 %stenosed.  Mid RCA lesion, 100 %stenosed.  LV end diastolic pressure is normal.   Low-normal LV function with a global ejection fraction at 50% with mild mid to basal inferior hypocontractility.   Significant multivessel CAD with evidence for coronary calcification, smooth distal 30% left main stenosis;, 30% ostial LAD stenosis and diffuse 90% LAD stenosis after the takeoff of a very proximal large diagonal 1 vessel;  70% ostial stenosis in the left circumflex coronary artery with 70% bifurcation stenosis of the OM1 with 50% stenosis in the proximal OM1 vessel.  The 70% stenosis prior to the bifurcation extending to 60% after the OM takeoff, and diffuse 80% proximal RCA stenosis with total occlusion of the mid RCA vessel with faint bridging collaterals to the mid RCA and faint  collateralization from the left coronary injection to the distal RCA branches.   RECOMMENDATION: Surgical consultation for CABG revascularization surgery.         ASSESSMENT:    1. S/P CABG x 4   2. Essential hypertension   3. Borderline diabetes mellitus  PLAN:  In order of problems listed above:  Status post CABG 4 doing well. Patient does not want to do cardiac rehabilitation. They live in [place] and are requesting to be followed in our [place] office. We'll have him see Dr. Wyline Mood in 2 months. Continue aspirin and atorvastatin  Essential hypertension blood pressure well controlled on lisinopril and metoprolol  Borderline diabetes mellitus diet controlled. Patient to see primary care at [facility] to become established.    Medication Adjustments/Labs and Tests Ordered: Current medicines are reviewed at length with the patient today.  Concerns regarding medicines are outlined above.  Medication changes, Labs and Tests ordered today are listed in the Patient Instructions below. There are no Patient Instructions on file for this visit.   Elson Clan, PA-C  12/16/2016 11:54 AM    [facility] [address] [place], [place], [place]  [address] Phone: [phone]; Fax: [phone]

## 2016-12-24 ENCOUNTER — Other Ambulatory Visit: Payer: Self-pay | Admitting: Family Medicine

## 2016-12-24 ENCOUNTER — Ambulatory Visit (INDEPENDENT_AMBULATORY_CARE_PROVIDER_SITE_OTHER): Payer: Medicare Other | Admitting: Family Medicine

## 2016-12-24 ENCOUNTER — Encounter: Payer: Self-pay | Admitting: Family Medicine

## 2016-12-24 VITALS — BP 114/68 | HR 79 | Temp 97.0°F | Ht 68.0 in | Wt 164.0 lb

## 2016-12-24 DIAGNOSIS — R7303 Prediabetes: Secondary | ICD-10-CM

## 2016-12-24 DIAGNOSIS — I10 Essential (primary) hypertension: Secondary | ICD-10-CM

## 2016-12-24 DIAGNOSIS — I2510 Atherosclerotic heart disease of native coronary artery without angina pectoris: Secondary | ICD-10-CM | POA: Diagnosis not present

## 2016-12-24 LAB — BAYER DCA HB A1C WAIVED: HB A1C: 6.6 % (ref <7.0)

## 2016-12-24 MED ORDER — ATORVASTATIN CALCIUM 80 MG PO TABS: 80.0000 mg | ORAL_TABLET | Freq: Every day | ORAL | 2 refills | Status: DC

## 2016-12-24 MED ORDER — METFORMIN HCL ER 500 MG PO TB24: 500.0000 mg | ORAL_TABLET | Freq: Every day | ORAL | 2 refills | Status: DC

## 2016-12-24 MED ORDER — LISINOPRIL 10 MG PO TABS: 10.0000 mg | ORAL_TABLET | Freq: Every day | ORAL | 2 refills | Status: DC

## 2016-12-24 MED ORDER — METOPROLOL TARTRATE 50 MG PO TABS: 50.0000 mg | ORAL_TABLET | Freq: Two times a day (BID) | ORAL | 2 refills | Status: DC

## 2016-12-24 NOTE — Progress Notes (Signed)
Subjective:  Patient ID: Jesus Curtis, male    DOB: [birth year]  Age: 76 y.o. MRN: [id number]  CC: New Patient (Initial Visit) (pt here today to establish care)   HPI Jesus Curtis presents for New patient visit. 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 He states that on Friday, August 3 he had a couple of episodes where he was walking up hill and felt weak and tired so he stopped and rested. After this happened twice he got home and rested through the weekend. On Monday he passed out at work and was transferred to the hospital and subsequently had open heart surgery for coronary bypass grafting. He was noted to have elevated blood pressure. Additionally he had an A1c of 6.7.  Today the patient has no complaints. In fact he is surprised that he's not had any soreness in his chest at the sternotomy site. He feels good and wants to get back to usual activities. 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  Depression screen PHQ 2/9 12/24/2016  Decreased Interest 0  Down, Depressed, Hopeless 0  PHQ - 2 Score 0    History Jesus Curtis has a past medical history of Abnormal stress test; Aortic atherosclerosis ([place]); Chest pain; Elevated hemoglobin A1c; Elevated troponin; Hypertension; Nausea & vomiting; Snake bite; SVT (supraventricular tachycardia) ([place]); and Syncope.   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   His family history includes Heart attack in his brother, brother, father, and mother; Heart disease in his brother, father, mother, and paternal grandfather; Hypertension in his father, mother, and paternal grandfather; Stroke in his  brother, maternal grandmother, and paternal grandmother.He reports that he has never smoked. He has never used smokeless tobacco. He reports that he does not drink alcohol or use drugs.    ROS Review of Systems  Constitutional: Negative for chills, diaphoresis, fever and unexpected weight change.  HENT: Negative for congestion, hearing loss, rhinorrhea and sore throat.   Eyes: Negative for visual disturbance.  Respiratory: Negative for cough and shortness of breath.   Cardiovascular: Negative for chest pain.  Gastrointestinal: Negative for abdominal pain, constipation and diarrhea.  Genitourinary: Negative for dysuria and flank pain.  Musculoskeletal: Negative for arthralgias and joint swelling.  Skin: Negative for rash.  Neurological: Negative for dizziness and headaches.  Psychiatric/Behavioral: Negative for dysphoric mood and sleep disturbance.    Objective:  BP 114/68   Pulse 79   Temp (!) 97 F (36.1 C) (Oral)   Ht '5\' 8"'  (1.727 m)   Wt 164 lb (74.4 kg)   BMI 24.94 kg/m   BP Readings from Last 3 Encounters:  12/24/16 114/68  12/16/16 140/80  11/29/16 123/90    Wt Readings from Last 3 Encounters:  12/24/16 164 lb (74.4 kg)  12/16/16 164 lb (74.4 kg)  11/29/16 166 lb 9.6 oz (75.6 kg)     Physical Exam  Constitutional: He is oriented to person, place, and time. He appears well-developed and well-nourished. No distress.  HENT:  Head: Normocephalic and atraumatic.  Right Ear: External ear normal.  Left Ear: External ear normal.  Nose: Nose normal.  Mouth/Throat: Oropharynx is clear  and moist. He does not have dentures. Abnormal dentition. Dental caries present.  Eyes: Pupils are equal, round, and reactive to light. Conjunctivae and EOM are normal.  Neck: Normal range of motion. Neck supple. No thyromegaly present.  Cardiovascular: Normal rate, regular rhythm and normal heart sounds.   No murmur heard. Pulmonary/Chest: Effort normal and breath sounds normal. No  respiratory distress. He has no wheezes. He has no rales.  Abdominal: Soft. Bowel sounds are normal. He exhibits no distension. There is no tenderness.  Lymphadenopathy:    He has no cervical adenopathy.  Neurological: He is alert and oriented to person, place, and time. He has normal reflexes.  Skin: Skin is warm and dry.  Psychiatric: He has a normal mood and affect. His behavior is normal. Judgment and thought content normal.   Recent labs reviewed. Of note is that he has recently had an A1c of 6.7 indicating diabetes.   Assessment & Plan:   Jesus Curtis was seen today for new patient (initial visit).  Diagnoses and all orders for this visit:  Borderline diabetes mellitus -     Bayer DCA Hb A1c Waived  Essential hypertension -     CBC with Differential/Platelet -     CMP14+EGFR  ASCVD (arteriosclerotic cardiovascular disease)  Other orders -     lisinopril (PRINIVIL,ZESTRIL) 10 MG tablet; Take 1 tablet (10 mg total) by mouth daily. -     atorvastatin (LIPITOR) 80 MG tablet; Take 1 tablet (80 mg total) by mouth daily at 6 PM. -     metoprolol tartrate (LOPRESSOR) 50 MG tablet; Take 1 tablet (50 mg total) by mouth 2 (two) times daily.       I am having Jesus Curtis maintain his Multiple Vitamin (ONE-A-DAY MENS PO), acetaminophen, aspirin EC, GINSENG PO, traMADol, lisinopril, atorvastatin, and metoprolol tartrate.  Allergies as of 12/24/2016      Reactions   Ativan [lorazepam] Other (See Comments)   "goes wild"      Medication List       Accurate as of 12/24/16  3:50 PM. Always use your most recent med list.          acetaminophen 500 MG tablet Commonly known as:  TYLENOL Take 1,000 mg by mouth every 6 (six) hours as needed for mild pain.   aspirin EC 325 MG tablet Take 325 mg by mouth daily.   atorvastatin 80 MG tablet Commonly known as:  LIPITOR Take 1 tablet (80 mg total) by mouth daily at 6 PM.   GINSENG PO Take 2 tablets by mouth daily.   lisinopril 10 MG  tablet Commonly known as:  PRINIVIL,ZESTRIL Take 1 tablet (10 mg total) by mouth daily.   metoprolol tartrate 50 MG tablet Commonly known as:  LOPRESSOR Take 1 tablet (50 mg total) by mouth 2 (two) times daily.   ONE-A-DAY MENS PO Take 1 tablet by mouth daily.   traMADol 50 MG tablet Commonly known as:  ULTRAM Take 50 mg by mouth every 4-6 hours PRN moderate to severe pain.            Discharge Care Instructions        Start     Ordered   12/24/16 0000  CBC with Differential/Platelet     12/24/16 1417   12/24/16 0000  CMP14+EGFR     12/24/16 1417   12/24/16 0000  Bayer DCA Hb A1c Waived     12/24/16 1417   12/24/16 0000  lisinopril (PRINIVIL,ZESTRIL) 10 MG tablet  Daily     12/24/16 1419   12/24/16 0000  atorvastatin (LIPITOR) 80 MG tablet  Daily-1800     12/24/16 1419   12/24/16 0000  metoprolol tartrate (LOPRESSOR) 50 MG tablet  2 times daily     12/24/16 1419    We'll hold off on EKG since he seen his cardiac surgeon next week.   Follow-up: Return in about 3 months (around 03/25/2017).  Claretta Fraise, M.D.

## 2016-12-25 ENCOUNTER — Telehealth: Payer: Self-pay | Admitting: Family Medicine

## 2016-12-25 LAB — CBC WITH DIFFERENTIAL/PLATELET
BASOS ABS: 0.1 10*3/uL (ref 0.0–0.2)
Basos: 1 %
EOS (ABSOLUTE): 0.4 10*3/uL (ref 0.0–0.4)
EOS: 5 %
HEMATOCRIT: 38.5 % (ref 37.5–51.0)
HEMOGLOBIN: 12.6 g/dL — AB (ref 13.0–17.7)
IMMATURE GRANULOCYTES: 0 %
Immature Grans (Abs): 0 10*3/uL (ref 0.0–0.1)
LYMPHS: 55 %
Lymphocytes Absolute: 4.3 10*3/uL — ABNORMAL HIGH (ref 0.7–3.1)
MCH: 28.5 pg (ref 26.6–33.0)
MCHC: 32.7 g/dL (ref 31.5–35.7)
MCV: 87 fL (ref 79–97)
MONOCYTES: 12 %
Monocytes Absolute: 0.9 10*3/uL (ref 0.1–0.9)
NEUTROS PCT: 27 %
Neutrophils Absolute: 2.2 10*3/uL (ref 1.4–7.0)
Platelets: 251 10*3/uL (ref 150–379)
RBC: 4.42 x10E6/uL (ref 4.14–5.80)
RDW: 14.7 % (ref 12.3–15.4)
WBC: 7.9 10*3/uL (ref 3.4–10.8)

## 2016-12-25 LAB — CMP14+EGFR
ALBUMIN: 4.2 g/dL (ref 3.5–4.8)
ALK PHOS: 104 IU/L (ref 39–117)
ALT: 27 IU/L (ref 0–44)
AST: 25 IU/L (ref 0–40)
Albumin/Globulin Ratio: 1.5 (ref 1.2–2.2)
BUN / CREAT RATIO: 13 (ref 10–24)
BUN: 9 mg/dL (ref 8–27)
Bilirubin Total: 0.3 mg/dL (ref 0.0–1.2)
CALCIUM: 9.5 mg/dL (ref 8.6–10.2)
CO2: 21 mmol/L (ref 20–29)
CREATININE: 0.69 mg/dL — AB (ref 0.76–1.27)
Chloride: 101 mmol/L (ref 96–106)
GFR calc Af Amer: 108 mL/min/{1.73_m2} (ref >59)
GFR, EST NON AFRICAN AMERICAN: 94 mL/min/{1.73_m2} (ref >59)
GLOBULIN, TOTAL: 2.8 g/dL (ref 1.5–4.5)
GLUCOSE: 118 mg/dL — AB (ref 65–99)
Potassium: 4.7 mmol/L (ref 3.5–5.2)
SODIUM: 137 mmol/L (ref 134–144)
Total Protein: 7 g/dL (ref 6.0–8.5)

## 2016-12-25 NOTE — Telephone Encounter (Signed)
VM not set up.

## 2016-12-30 ENCOUNTER — Other Ambulatory Visit: Payer: Self-pay

## 2016-12-30 DIAGNOSIS — I25118 Atherosclerotic heart disease of native coronary artery with other forms of angina pectoris: Secondary | ICD-10-CM

## 2016-12-31 ENCOUNTER — Encounter: Payer: Self-pay | Admitting: Cardiothoracic Surgery

## 2016-12-31 ENCOUNTER — Encounter: Payer: Self-pay | Admitting: *Deleted

## 2016-12-31 ENCOUNTER — Ambulatory Visit
Admission: RE | Admit: 2016-12-31 | Discharge: 2016-12-31 | Disposition: A | Discharge status: Home / Self Care | Payer: Medicare Other | Source: Ambulatory Visit | Attending: Cardiothoracic Surgery | Admitting: Cardiothoracic Surgery

## 2016-12-31 ENCOUNTER — Ambulatory Visit (INDEPENDENT_AMBULATORY_CARE_PROVIDER_SITE_OTHER): Payer: Self-pay | Admitting: Cardiothoracic Surgery

## 2016-12-31 ENCOUNTER — Other Ambulatory Visit: Payer: Self-pay | Admitting: Cardiothoracic Surgery

## 2016-12-31 VITALS — BP 150/88 | HR 86 | Resp 20 | Ht 68.0 in | Wt 164.0 lb

## 2016-12-31 DIAGNOSIS — I25118 Atherosclerotic heart disease of native coronary artery with other forms of angina pectoris: Secondary | ICD-10-CM

## 2016-12-31 DIAGNOSIS — Z951 Presence of aortocoronary bypass graft: Secondary | ICD-10-CM

## 2016-12-31 DIAGNOSIS — J9811 Atelectasis: Secondary | ICD-10-CM | POA: Diagnosis not present

## 2016-12-31 NOTE — Progress Notes (Signed)
PCP is Mechele ClaudeStacks, Warren, MD Referring Provider is No ref. provider found  Chief Complaint  Patient presents with  . Routine Post Op    f/u from surgery with CXR s/p Coronary artery bypass grafting x4, 11/24/16     HPI: 1 month follow-up after CABG 4 for unstable angina three-vessel CAD Patient is doing well without recurrent symptoms of angina Surgical incisions healing well Patient anxious to resume driving and working in the yard Patient seen by cardiology and his further follow-up will be coordinated with Dr. Wyline [place]Branch in [place] Maintaining sinus rhythm Appetite and sleeping patterns still not back to normal  Past Medical History:  Diagnosis Date  . Abnormal stress test   . Aortic atherosclerosis (HCC)   . Chest pain   . Elevated hemoglobin A1c   . Elevated troponin   . Hypertension   . Nausea & vomiting   . Snake bite    AS A CHILD HAD PMH  . SVT (supraventricular tachycardia) (HCC)   . Syncope     Past Surgical History:  Procedure Laterality Date  . CORONARY ARTERY BYPASS GRAFT N/A 11/24/2016   Procedure: CORONARY ARTERY BYPASS GRAFTING (CABG), ON PUMP, TIMES FOUR, USING LEFT INTERNAL MAMMARY ARTERY AND ENDOSCOPICALLY HARVESTED RIGHT GREATER SAPHENOUS VEIN;  Surgeon: Kerin PernaVan Trigt, Jairo Bellew, MD;  Location: [facility] OR;  Service: Open Heart Surgery;  Laterality: N/A;  LIMA to LAD SVG to PDA SVG to OM1 SVG to Diag1  . LEFT HEART CATH AND CORONARY ANGIOGRAPHY N/A 11/21/2016   Procedure: LEFT HEART CATH AND CORONARY ANGIOGRAPHY;  Surgeon: Lennette BihariKelly, Thomas A, MD;  Location: [facility] INVASIVE CV LAB;  Service: Cardiovascular;  Laterality: N/A;  . TEE WITHOUT CARDIOVERSION N/A 11/24/2016   Procedure: TRANSESOPHAGEAL ECHOCARDIOGRAM (TEE);  Surgeon: Donata ClayVan Trigt, Theron AristaPeter, MD;  Location: [facility] OR;  Service: Open Heart Surgery;  Laterality: N/A;    Family History  Problem Relation Age of Onset  . Heart attack Mother   . Heart disease Mother   . Hypertension Mother   . Heart attack Father   . Heart disease  Father   . Hypertension Father   . Heart attack Brother   . Heart disease Brother   . Stroke Brother   . Stroke Maternal Grandmother   . Stroke Paternal Grandmother   . Heart disease Paternal Grandfather   . Hypertension Paternal Grandfather   . Heart attack Brother     Social History Social History  Substance Use Topics  . Smoking status: Never Smoker  . Smokeless tobacco: Never Used  . Alcohol use No    Current Outpatient Prescriptions  Medication Sig Dispense Refill  . acetaminophen (TYLENOL) 500 MG tablet Take 1,000 mg by mouth every 6 (six) hours as needed for mild pain.    Marland Kitchen. aspirin EC 325 MG tablet Take 325 mg by mouth daily.    Marland Kitchen. atorvastatin (LIPITOR) 80 MG tablet Take 1 tablet (80 mg total) by mouth daily at 6 PM. 30 tablet 2  . GINSENG PO Take 2 tablets by mouth daily.    Marland Kitchen. lisinopril (PRINIVIL,ZESTRIL) 10 MG tablet Take 1 tablet (10 mg total) by mouth daily. 30 tablet 2  . metFORMIN (GLUCOPHAGE-XR) 500 MG 24 hr tablet Take 1 tablet (500 mg total) by mouth daily with breakfast. 30 tablet 2  . metoprolol tartrate (LOPRESSOR) 50 MG tablet Take 1 tablet (50 mg total) by mouth 2 (two) times daily. 60 tablet 2  . Multiple Vitamin (ONE-A-DAY MENS PO) Take 1 tablet by mouth daily.    .Marland Kitchen  traMADol (ULTRAM) 50 MG tablet Take 50 mg by mouth every 4-6 hours PRN moderate to severe pain. 30 tablet 0   No current facility-administered medications for this visit.     Allergies  Allergen Reactions  . Ativan [Lorazepam] Other (See Comments)    "goes wild"    Review of Systems  Doing well after multivessel CABG  BP (!) 150/88   Pulse 86   Resp 20   Ht  (1.727 m)   Wt 164 lb (74.4 kg)   SpO2 99% Comment: RA  BMI 24.94 kg/m  Physical Exam      Exam    General- alert and comfortable   Lungs- clear without rales, wheezes   Cor- regular rate and rhythm, no murmur , gallop   Abdomen- soft, non-tender   Extremities - warm, non-tender, minimal edema   Neuro- oriented,  appropriate, no focal weakness   Diagnostic Tests: Chest x-ray clear sternal wires intact and well aligned  Impression: Excellent early recovery one month after surgery The patient will be able to resume driving his normal 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 He'll refrain from lifting more than 25 pounds until November 1 when he can return to work. He'll continue his current medications Plan: Return as needed. Importance of a heart healthy diet and lifestyle reviewed with patient and family.   Mikey Bussing, MD [facility] [phone] area

## 2017-01-07 NOTE — Telephone Encounter (Signed)
Attempts have been made to patient and no call back. This encounter will be closed.

## 2017-01-23 ENCOUNTER — Encounter: Payer: Self-pay | Admitting: Cardiology

## 2017-01-23 ENCOUNTER — Ambulatory Visit (INDEPENDENT_AMBULATORY_CARE_PROVIDER_SITE_OTHER): Payer: Medicare Other | Admitting: Cardiology

## 2017-01-23 VITALS — BP 150/88 | HR 72 | Ht 68.0 in | Wt 169.0 lb

## 2017-01-23 DIAGNOSIS — I2510 Atherosclerotic heart disease of native coronary artery without angina pectoris: Secondary | ICD-10-CM

## 2017-01-23 DIAGNOSIS — I10 Essential (primary) hypertension: Secondary | ICD-10-CM

## 2017-01-23 DIAGNOSIS — Z79899 Other long term (current) drug therapy: Secondary | ICD-10-CM | POA: Diagnosis not present

## 2017-01-23 MED ORDER — LISINOPRIL 20 MG PO TABS: 20.0000 mg | ORAL_TABLET | Freq: Every day | ORAL | 3 refills | Status: DC

## 2017-01-23 NOTE — Patient Instructions (Signed)
Medication Instructions:  DECREASE  ASPIRIN 81 MG DAILY  INCREASE LISINOPRIL TO 20 MG   Labwork: 2 WEEKS  BMET   Testing/Procedures: NONE  Follow-Up: Your physician wants you to follow-up in: 6 MONTHS.  You will receive a reminder letter in the mail two months in advance. If you don't receive a letter, please call our office to schedule the follow-up appointment.   Any Other Special Instructions Will Be Listed Below (If Applicable). PLEASE KEEP A BLOOD PRESSURE LOG FOR 2 WEEKS     If you need a refill on your cardiac medications before your next appointment, please call your pharmacy.

## 2017-01-23 NOTE — Progress Notes (Signed)
Clinical Summary Mr. Nolt is a 76 y.o.male last seen by PA Lenze, this is our first visit together. He is seen for the following medical problems.   1. CAD - 11/2016 cath with severe multivessel disease, referred for CABG - 11/24/16 CABG x4 (left internal mammary artery to left anterior descending, saphenous vein graft to diagonal, saphenous vein graft to circumflex marginal, saphenous vein graft to posterior descending Jenny Lai of the distal circumflex) - 11/2016 echo LVEF 55-60%, grade II diastolic dysfunction  - no recent chest pain. No recent SOB or DOE - compliant with meds - walking daily 1 mile without troubles.   2. HTN - compliant with meds   Past Medical History:  Diagnosis Date  . Abnormal stress test   . Aortic atherosclerosis (HCC)   . Chest pain   . Elevated hemoglobin A1c   . Elevated troponin   . Hypertension   . Nausea & vomiting   . Snake bite    AS A CHILD HAD PMH  . SVT (supraventricular tachycardia) (HCC)   . Syncope      Allergies  Allergen Reactions  . Ativan [Lorazepam] Other (See Comments)    "goes wild"     Current Outpatient Prescriptions  Medication Sig Dispense Refill  . acetaminophen (TYLENOL) 500 MG tablet Take 1,000 mg by mouth every 6 (six) hours as needed for mild pain.    Marland Kitchen aspirin EC 325 MG tablet Take 325 mg by mouth daily.    Marland Kitchen atorvastatin (LIPITOR) 80 MG tablet Take 1 tablet (80 mg total) by mouth daily at 6 PM. 30 tablet 2  . GINSENG PO Take 2 tablets by mouth daily.    Marland Kitchen lisinopril (PRINIVIL,ZESTRIL) 10 MG tablet Take 1 tablet (10 mg total) by mouth daily. 30 tablet 2  . metFORMIN (GLUCOPHAGE-XR) 500 MG 24 hr tablet Take 1 tablet (500 mg total) by mouth daily with breakfast. 30 tablet 2  . metoprolol tartrate (LOPRESSOR) 50 MG tablet Take 1 tablet (50 mg total) by mouth 2 (two) times daily. 60 tablet 2  . Multiple Vitamin (ONE-A-DAY MENS PO) Take 1 tablet by mouth daily.    . traMADol (ULTRAM) 50 MG tablet  Take 50 mg by mouth every 4-6 hours PRN moderate to severe pain. 30 tablet 0   No current facility-administered medications for this visit.      Past Surgical History:  Procedure Laterality Date  . CORONARY ARTERY BYPASS GRAFT N/A 11/24/2016   Procedure: CORONARY ARTERY BYPASS GRAFTING (CABG), ON PUMP, TIMES FOUR, USING LEFT INTERNAL MAMMARY ARTERY AND ENDOSCOPICALLY HARVESTED RIGHT GREATER SAPHENOUS VEIN;  Surgeon: Kerin Perna, MD;  Location: [facility] OR;  Service: Open Heart Surgery;  Laterality: N/A;  LIMA to LAD SVG to PDA SVG to OM1 SVG to Diag1  . LEFT HEART CATH AND CORONARY ANGIOGRAPHY N/A 11/21/2016   Procedure: LEFT HEART CATH AND CORONARY ANGIOGRAPHY;  Surgeon: Lennette Bihari, MD;  Location: [facility] INVASIVE CV LAB;  Service: Cardiovascular;  Laterality: N/A;  . TEE WITHOUT CARDIOVERSION N/A 11/24/2016   Procedure: TRANSESOPHAGEAL ECHOCARDIOGRAM (TEE);  Surgeon: Donata Clay, Theron Arista, MD;  Location: [facility] OR;  Service: Open Heart Surgery;  Laterality: N/A;     Allergies  Allergen Reactions  . Ativan [Lorazepam] Other (See Comments)    "goes wild"      Family History  Problem Relation Age of Onset  . Heart attack Mother   . Heart disease Mother   . Hypertension Mother   . Heart attack  Father   . Heart disease Father   . Hypertension Father   . Heart attack Brother   . Heart disease Brother   . Stroke Brother   . Stroke Maternal Grandmother   . Stroke Paternal Grandmother   . Heart disease Paternal Grandfather   . Hypertension Paternal Grandfather   . Heart attack Brother      Social History Mr. Sharp reports that he has never smoked. He has never used smokeless tobacco. Mr. Meuser reports that he does not drink alcohol.   Review of Systems CONSTITUTIONAL: No weight loss, fever, chills, weakness or fatigue.  HEENT: Eyes: No visual loss, blurred vision, double vision or yellow sclerae.No hearing loss, sneezing, congestion, runny nose or sore throat.  SKIN: No rash or  itching.  CARDIOVASCULAR: per hpi RESPIRATORY: No shortness of breath, cough or sputum.  GASTROINTESTINAL: No anorexia, nausea, vomiting or diarrhea. No abdominal pain or blood.  GENITOURINARY: No burning on urination, no polyuria NEUROLOGICAL: No headache, dizziness, syncope, paralysis, ataxia, numbness or tingling in the extremities. No change in bowel or bladder control.  MUSCULOSKELETAL: No muscle, back pain, joint pain or stiffness.  LYMPHATICS: No enlarged nodes. No history of splenectomy.  PSYCHIATRIC: No history of depression or anxiety.  ENDOCRINOLOGIC: No reports of sweating, cold or heat intolerance. No polyuria or polydipsia.  Marland Kitchen   Physical Examination Vitals:   01/23/17 1011  BP: (!) 150/88  Pulse: 72  SpO2: 97%   Vitals:   01/23/17 1011  Weight: 169 lb (76.7 kg)  Height:  (1.727 m)    Gen: resting comfortably, no acute distress HEENT: no scleral icterus, pupils equal round and reactive, no palptable cervical adenopathy,  CV: RRR, no m/r/,g no jvd Resp: Clear to auscultation bilaterally GI: abdomen is soft, non-tender, non-distended, normal bowel sounds, no hepatosplenomegaly MSK: extremities are warm, no edema.  Skin: warm, no rash Neuro:  no focal deficits Psych: appropriate affect   Diagnostic Studies 11/2016 cath   LM lesion, 30 %stenosed.  Ost LAD to Prox LAD lesion, 30 %stenosed.  Mid LAD lesion, 90 %stenosed.  1st Mrg lesion, 50 %stenosed.  Ost Cx to Prox Cx lesion, 70 %stenosed.  Prox Cx to Dist Cx lesion, 70 %stenosed.  Ost RCA to Prox RCA lesion, 80 %stenosed.  Mid RCA lesion, 100 %stenosed.  LV end diastolic pressure is normal.   11/2016 echo   - Left ventricle: The cavity size was normal. Systolic function was   normal. The estimated ejection fraction was in the range of 55%   to 60%. Wall motion was normal; there were no regional wall   motion abnormalities. Features are consistent with a pseudonormal   left ventricular  filling pattern, with concomitant abnormal   relaxation and increased filling pressure (grade 2 diastolic   dysfunction). Doppler parameters are consistent with elevated   mean left atrial filling pressure. - Left atrium: The atrium was mildly dilated. - Pericardium, extracardiac: A trivial pericardial effusion was   identified.   11/2016 carotid US Findings suggest 1-39% internal carotid artery stenosis bilaterally. Vertebral arteries are patent with antegrade flow.   Assessment and Plan  1. CAD - no recent symptoms. We will lower asprin to  daily  2. HTN - elevated in clinic, increase lisinopril to  daily. Check BMET in 2 weeks after increase - submit bp log in 2 weeks  F/u 6 months      Antoine Poche, M.D.

## 2017-02-06 ENCOUNTER — Ambulatory Visit: Payer: Medicare Other

## 2017-02-06 DIAGNOSIS — I10 Essential (primary) hypertension: Secondary | ICD-10-CM | POA: Diagnosis not present

## 2017-02-06 DIAGNOSIS — Z79899 Other long term (current) drug therapy: Secondary | ICD-10-CM | POA: Diagnosis not present

## 2017-02-06 DIAGNOSIS — I2510 Atherosclerotic heart disease of native coronary artery without angina pectoris: Secondary | ICD-10-CM | POA: Diagnosis not present

## 2017-02-06 NOTE — Addendum Note (Signed)
Addended by: Kerney ElbePINNIX, Berenise Hunton G on: 02/06/2017 11:46 AM   Modules accepted: Orders

## 2017-02-07 LAB — BASIC METABOLIC PANEL
BUN/Creatinine Ratio: 8 — ABNORMAL LOW (ref 10–24)
BUN: 6 mg/dL — AB (ref 8–27)
CALCIUM: 9.9 mg/dL (ref 8.6–10.2)
CO2: 24 mmol/L (ref 20–29)
CREATININE: 0.77 mg/dL (ref 0.76–1.27)
Chloride: 103 mmol/L (ref 96–106)
GFR calc Af Amer: 103 mL/min/{1.73_m2} (ref >59)
GFR calc non Af Amer: 89 mL/min/{1.73_m2} (ref >59)
GLUCOSE: 129 mg/dL — AB (ref 65–99)
Potassium: 4 mmol/L (ref 3.5–5.2)
SODIUM: 140 mmol/L (ref 134–144)

## 2017-03-21 ENCOUNTER — Other Ambulatory Visit: Payer: Self-pay | Admitting: Family Medicine

## 2017-03-27 ENCOUNTER — Encounter: Payer: Self-pay | Admitting: Family Medicine

## 2017-03-27 ENCOUNTER — Ambulatory Visit (INDEPENDENT_AMBULATORY_CARE_PROVIDER_SITE_OTHER): Payer: Medicare Other | Admitting: Family Medicine

## 2017-03-27 VITALS — BP 134/81 | HR 76 | Temp 97.0°F | Ht 68.0 in | Wt 167.0 lb

## 2017-03-27 DIAGNOSIS — R7303 Prediabetes: Secondary | ICD-10-CM

## 2017-03-27 DIAGNOSIS — I10 Essential (primary) hypertension: Secondary | ICD-10-CM

## 2017-03-27 LAB — BAYER DCA HB A1C WAIVED: HB A1C (BAYER DCA - WAIVED): 6.8 % (ref <7.0)

## 2017-03-27 MED ORDER — BLOOD GLUCOSE MONITOR KIT: PACK | 11 refills | Status: DC

## 2017-03-27 NOTE — Progress Notes (Signed)
Subjective:  Patient ID: Jesus Curtis, male    DOB: December 21, 1940  Age: 76 y.o. MRN: [id number]  CC: Follow-up (pt here today for routine follow up, no other concerns voiced)   HPI Jesus Curtis presents forFollow-up of diabetes. Patient not checking blood sugar at home.  Patient denies symptoms such as polyuria, polydipsia, excessive hunger, nausea No significant hypoglycemic spells noted. Medications reviewed. Pt reports taking them regularly without complication/adverse reaction being reported today.  Checking feet daily.  Follow-up of hypertension. Patient has no Jesus of headache chest pain or shortness of breath or recent cough. Patient also denies symptoms of TIA such as numbness weakness lateralizing. Patient denies side effects from his medication. States taking it regularly.   Jesus Curtis has a past medical Jesus of Abnormal stress test, Aortic atherosclerosis ([place]), Chest pain, Elevated hemoglobin A1c, Elevated troponin, Hypertension, Nausea & vomiting, Snake bite, SVT (supraventricular tachycardia) ([place]), and Syncope.   Jesus Curtis has a past surgical Jesus that includes LEFT HEART CATH AND CORONARY ANGIOGRAPHY (N/A, 11/21/2016); Coronary artery bypass graft (N/A, 11/24/2016); and TEE without cardioversion (N/A, 11/24/2016).   His family Jesus includes Heart attack in his brother, brother, father, and mother; Heart disease in his brother, father, mother, and paternal grandfather; Hypertension in his father, mother, and paternal grandfather; Stroke in his brother, maternal grandmother, and paternal grandmother.Jesus Curtis reports that  has never smoked. Jesus Curtis has never used smokeless tobacco. Jesus Curtis reports that Jesus Curtis does not drink alcohol or use drugs.  Current Outpatient Medications on File Prior to Visit  Medication Sig Dispense Refill  . acetaminophen (TYLENOL) 500 MG tablet Take 1,000 mg by mouth every 6 (six) hours as needed for mild pain.    Marland Kitchen aspirin EC 81 MG tablet Take 81 mg by mouth  daily.    Marland Kitchen atorvastatin (LIPITOR) 80 MG tablet Take 1 tablet (80 mg total) by mouth daily at 6 PM. 30 tablet 2  . GINSENG PO Take 2 tablets by mouth daily.    Marland Kitchen lisinopril (PRINIVIL,ZESTRIL) 20 MG tablet Take 1 tablet (20 mg total) by mouth daily. 90 tablet 3  . metFORMIN (GLUCOPHAGE-XR) 500 MG 24 hr tablet TAKE 1 TABLET BY MOUTH EVERY DAY WITH BREAKFAST 90 tablet 0  . metoprolol tartrate (LOPRESSOR) 50 MG tablet Take 1 tablet (50 mg total) by mouth 2 (two) times daily. 60 tablet 2  . Multiple Vitamin (ONE-A-DAY MENS PO) Take 1 tablet by mouth daily.     No current facility-administered medications on file prior to visit.     ROS Review of Systems  Constitutional: Negative for chills, diaphoresis, fever and unexpected weight change.  HENT: Negative for congestion, hearing loss, rhinorrhea and sore throat.   Eyes: Negative for visual disturbance.  Respiratory: Negative for cough and shortness of breath.   Cardiovascular: Negative for chest pain.  Gastrointestinal: Negative for abdominal pain, constipation and diarrhea.  Genitourinary: Negative for dysuria and flank pain.  Musculoskeletal: Negative for arthralgias and joint swelling.  Skin: Negative for rash.  Neurological: Negative for dizziness and headaches.  Psychiatric/Behavioral: Negative for dysphoric mood and sleep disturbance.    Objective:  BP 134/81   Pulse 76   Temp (!) 97 F (36.1 C) (Oral)   Ht 5' 8" (1.727 m)   Wt 167 lb (75.8 kg)   BMI 25.39 kg/m   BP Readings from Last 3 Encounters:  03/27/17 134/81  01/23/17 (!) 150/88  12/31/16 (!) 150/88    Wt Readings from Last 3 Encounters:  03/27/17 167  lb (75.8 kg)  01/23/17 169 lb (76.7 kg)  12/31/16 164 lb (74.4 kg)     Physical Exam  Constitutional: Jesus Curtis is oriented to person, place, and time. Jesus Curtis appears well-developed and well-nourished. No distress.  HENT:  Head: Normocephalic and atraumatic.  Right Ear: External ear normal.  Left Ear: External ear  normal.  Nose: Nose normal.  Mouth/Throat: Oropharynx is clear and moist.  Eyes: Conjunctivae and EOM are normal. Pupils are equal, round, and reactive to light.  Neck: Normal range of motion. Neck supple. No thyromegaly present.  Cardiovascular: Normal rate, regular rhythm and normal heart sounds.  No murmur heard. Pulmonary/Chest: Effort normal and breath sounds normal. No respiratory distress. Jesus Curtis has no wheezes. Jesus Curtis has no rales.  Abdominal: Soft. Bowel sounds are normal. Jesus Curtis exhibits no distension. There is no tenderness.  Lymphadenopathy:    Jesus Curtis has no cervical adenopathy.  Neurological: Jesus Curtis is alert and oriented to person, place, and time. Jesus Curtis has normal reflexes.  Skin: Skin is warm and dry.  Psychiatric: Jesus Curtis has a normal mood and affect. His behavior is normal. Judgment and thought content normal.     Assessment & Plan:   Jesus Curtis was seen today for follow-up.  Diagnoses and all orders for this visit:  Borderline diabetes mellitus -     Bayer DCA Hb A1c Waived  Essential hypertension  Other orders -     blood glucose meter kit and supplies KIT; Dispense based on patient and insurance preference. Use up to four times daily as directed. (FOR ICD-9 250.00, 250.01).      I have discontinued Jesus Curtis's traMADol. I am also having him start on blood glucose meter kit and supplies. Additionally, I am having him maintain his Multiple Vitamin (ONE-A-DAY MENS PO), acetaminophen, GINSENG PO, atorvastatin, metoprolol tartrate, lisinopril, aspirin EC, and metFORMIN.  Meds ordered this encounter  Medications  . blood glucose meter kit and supplies KIT    Sig: Dispense based on patient and insurance preference. Use up to four times daily as directed. (FOR ICD-9 250.00, 250.01).    Dispense:  1 each    Refill:  11    Order Specific Question:   Number of strips    Answer:   100    Order Specific Question:   Number of lancets    Answer:   100     Follow-up: Return in about 3  months (around 06/25/2017).  Claretta Fraise, M.D.

## 2017-04-25 ENCOUNTER — Other Ambulatory Visit: Payer: Self-pay | Admitting: Physician Assistant

## 2017-05-05 ENCOUNTER — Other Ambulatory Visit: Payer: Self-pay | Admitting: *Deleted

## 2017-05-05 MED ORDER — GLUCOSE BLOOD VI STRP: ORAL_STRIP | 1 refills | Status: AC

## 2017-05-22 ENCOUNTER — Other Ambulatory Visit: Payer: Self-pay | Admitting: *Deleted

## 2017-05-22 MED ORDER — METOPROLOL TARTRATE 50 MG PO TABS: 50.0000 mg | ORAL_TABLET | Freq: Two times a day (BID) | ORAL | 3 refills | Status: DC

## 2017-05-22 MED ORDER — ATORVASTATIN CALCIUM 80 MG PO TABS: ORAL_TABLET | ORAL | 3 refills | Status: DC

## 2017-05-22 NOTE — Telephone Encounter (Signed)
Pharmacy requesting a ninety day rx. 

## 2017-06-08 ENCOUNTER — Other Ambulatory Visit: Payer: Self-pay

## 2017-06-08 MED ORDER — METFORMIN HCL ER 500 MG PO TB24: ORAL_TABLET | ORAL | 0 refills | Status: DC

## 2017-06-26 ENCOUNTER — Other Ambulatory Visit: Payer: Self-pay | Admitting: *Deleted

## 2017-06-26 ENCOUNTER — Ambulatory Visit (INDEPENDENT_AMBULATORY_CARE_PROVIDER_SITE_OTHER): Payer: Medicare Other | Admitting: Family Medicine

## 2017-06-26 ENCOUNTER — Encounter: Payer: Self-pay | Admitting: Family Medicine

## 2017-06-26 VITALS — BP 141/70 | HR 71 | Temp 97.1°F | Ht 68.0 in | Wt 171.0 lb

## 2017-06-26 DIAGNOSIS — I10 Essential (primary) hypertension: Secondary | ICD-10-CM

## 2017-06-26 DIAGNOSIS — E1169 Type 2 diabetes mellitus with other specified complication: Secondary | ICD-10-CM

## 2017-06-26 DIAGNOSIS — R7303 Prediabetes: Secondary | ICD-10-CM | POA: Diagnosis not present

## 2017-06-26 DIAGNOSIS — E785 Hyperlipidemia, unspecified: Secondary | ICD-10-CM | POA: Diagnosis not present

## 2017-06-26 DIAGNOSIS — N400 Enlarged prostate without lower urinary tract symptoms: Secondary | ICD-10-CM | POA: Diagnosis not present

## 2017-06-26 DIAGNOSIS — Z125 Encounter for screening for malignant neoplasm of prostate: Secondary | ICD-10-CM | POA: Diagnosis not present

## 2017-06-26 LAB — BAYER DCA HB A1C WAIVED: HB A1C: 6.4 % (ref <7.0)

## 2017-06-26 MED ORDER — METFORMIN HCL ER 500 MG PO TB24: ORAL_TABLET | ORAL | 0 refills | Status: DC

## 2017-06-26 NOTE — Progress Notes (Signed)
Subjective:  Patient ID: Jesus Curtis,  male    DOB: May 10, 1940  Age: 77 y.o.    CC: Follow-up   HPI Jesus Curtis presents for  follow-up of hypertension. Patient has no history of headache chest pain or shortness of breath or recent cough. Patient also denies symptoms of TIA such as numbness weakness lateralizing. Patient checks  blood pressure at home. Recent readings have been adequate. Systolic runs [id number] usually Patient denies side effects from medication. States taking it regularly.  Patient also  in for follow-up of elevated cholesterol. Doing well without complaints on current medication. Denies side effects of statin including myalgia and arthralgia and nausea. Also in today for liver function testing. Currently no chest pain, shortness of breath or other cardiovascular related symptoms noted.  Follow-up of diabetes. Patient does check blood sugar at home. Readings run between 110 and 130 fasting. No checking postprandial Patient denies symptoms such as polyuria, polydipsia, excessive hunger, nausea No significant hypoglycemic spells noted. Medications reviewed. Pt reports taking them regularly. Pt. denies complication/adverse reaction today.    History Jesus Curtis has a past medical history of Abnormal stress test, Aortic atherosclerosis (Jesus Curtis), Chest pain, Elevated hemoglobin A1c, Elevated troponin, Hypertension, Nausea & vomiting, Snake bite, SVT (supraventricular tachycardia) (Jesus Curtis), and Syncope.   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   His family history includes Heart attack in his brother, brother, father, and mother; Heart disease in his brother, father, mother, and paternal grandfather; Hypertension in his father, mother, and paternal grandfather; Stroke in his brother, maternal grandmother, and paternal  grandmother.He reports that  has never smoked. he has never used smokeless tobacco. He reports that he does not drink alcohol or use drugs.  Current Outpatient Medications on File Prior to Visit  Medication Sig Dispense Refill  . acetaminophen (TYLENOL) 500 MG tablet Take 1,000 mg by mouth every 6 (six) hours as needed for mild pain.    Marland Kitchen aspirin EC 81 MG tablet Take 81 mg by mouth daily.    Marland Kitchen atorvastatin (LIPITOR) 80 MG tablet TAKE 1 TABLET EVERY DAY AT 6PM 90 tablet 3  . blood glucose meter kit and supplies KIT Dispense based on patient and insurance preference. Use up to four times daily as directed. (FOR ICD-9 250.00, 250.01). 1 each 11  . GINSENG PO Take 2 tablets by mouth daily.    Marland Kitchen glucose blood (ONE TOUCH ULTRA TEST) test strip Use to check blood sugars up to four times a day 400 each 1  . lisinopril (PRINIVIL,ZESTRIL) 20 MG tablet Take 1 tablet (20 mg total) by mouth daily. 90 tablet 3  . metoprolol tartrate (LOPRESSOR) 50 MG tablet Take 1 tablet (50 mg total) by mouth 2 (two) times daily. 180 tablet 3  . Multiple Vitamin (ONE-A-DAY MENS PO) Take 1 tablet by mouth daily.     No current facility-administered medications on file prior to visit.     ROS Review of Systems  Constitutional: Negative for chills, diaphoresis and fever.  HENT: Negative for rhinorrhea and sore throat.   Respiratory: Negative for cough and shortness of breath.   Cardiovascular: Negative for chest pain.  Gastrointestinal: Negative for abdominal pain.  Musculoskeletal: Negative for arthralgias and myalgias.  Skin: Negative for rash.  Neurological: Negative for weakness and headaches.    Objective:  BP (!) 141/70   Pulse 71  Temp (!) 97.1 F (36.2 C) (Oral)   Ht '5\' 8"'  (1.727 m)   Wt 171 lb (77.6 kg)   BMI 26.00 kg/m   BP Readings from Last 3 Encounters:  06/26/17 (!) 141/70  03/27/17 134/81  01/23/17 (!) 150/88    Wt Readings from Last 3 Encounters:  06/26/17 171 lb (77.6 kg)  03/27/17  167 lb (75.8 kg)  01/23/17 169 lb (76.7 kg)     Physical Exam  Constitutional: He is oriented to person, place, and time. He appears well-developed and well-nourished. No distress.  HENT:  Head: Normocephalic and atraumatic.  Right Ear: External ear normal.  Left Ear: External ear normal.  Nose: Nose normal.  Mouth/Throat: Oropharynx is clear and moist.  Eyes: Conjunctivae and EOM are normal. Pupils are equal, round, and reactive to light.  Neck: Normal range of motion. Neck supple. No thyromegaly present.  Cardiovascular: Normal rate, regular rhythm and normal heart sounds.  No murmur heard. Pulmonary/Chest: Effort normal and breath sounds normal. No respiratory distress. He has no wheezes. He has no rales.  Abdominal: Soft. Bowel sounds are normal. He exhibits no distension. There is no tenderness.  Lymphadenopathy:    He has no cervical adenopathy.  Neurological: He is alert and oriented to person, place, and time. He has normal reflexes.  Skin: Skin is warm and dry.  Psychiatric: He has a normal mood and affect. His behavior is normal. Judgment and thought content normal.    Diabetic Foot Exam - Simple   Simple Foot Form Diabetic Foot exam was performed with the following findings:  Yes 06/26/2017  4:40 PM  Visual Inspection No deformities, no ulcerations, no other skin breakdown bilaterally:  Yes Sensation Testing Intact to touch and monofilament testing bilaterally:  Yes Pulse Check Posterior Tibialis and Dorsalis pulse intact bilaterally:  Yes Comments       Assessment & Plan:   Jesus Curtis was seen today for follow-up.  Diagnoses and all orders for this visit:  Essential hypertension -     CBC with Differential/Platelet -     CMP14+EGFR  Borderline diabetes mellitus  Hyperlipidemia associated with type 2 diabetes mellitus (HCC) -     Lipid panel -     Microalbumin / creatinine urine ratio -     Bayer DCA Hb A1c Waived -     Urinalysis  Screening for prostate  cancer -     PSA, total and free  Other orders -     metFORMIN (GLUCOPHAGE-XR) 500 MG 24 hr tablet; TAKE 1 TABLET BY MOUTH EVERY DAY WITH BREAKFAST   I am having Jesus Curtis maintain his Multiple Vitamin (ONE-A-DAY MENS PO), acetaminophen, GINSENG PO, lisinopril, aspirin EC, blood glucose meter kit and supplies, glucose blood, metoprolol tartrate, atorvastatin, and metFORMIN.  Meds ordered this encounter  Medications  . metFORMIN (GLUCOPHAGE-XR) 500 MG 24 hr tablet    Sig: TAKE 1 TABLET BY MOUTH EVERY DAY WITH BREAKFAST    Dispense:  90 tablet    Refill:  0    Reminder to be seen before next refill     Follow-up: Return in about 3 months (around 09/26/2017).  Claretta Fraise, M.D.

## 2017-06-27 LAB — CMP14+EGFR
A/G RATIO: 1.7 (ref 1.2–2.2)
ALBUMIN: 4.5 g/dL (ref 3.5–4.8)
ALT: 27 IU/L (ref 0–44)
AST: 27 IU/L (ref 0–40)
Alkaline Phosphatase: 88 IU/L (ref 39–117)
BUN / CREAT RATIO: 11 (ref 10–24)
BUN: 10 mg/dL (ref 8–27)
Bilirubin Total: 0.2 mg/dL (ref 0.0–1.2)
CO2: 25 mmol/L (ref 20–29)
CREATININE: 0.88 mg/dL (ref 0.76–1.27)
Calcium: 9.8 mg/dL (ref 8.6–10.2)
Chloride: 102 mmol/L (ref 96–106)
GFR calc non Af Amer: 84 mL/min/{1.73_m2} (ref >59)
GFR, EST AFRICAN AMERICAN: 97 mL/min/{1.73_m2} (ref >59)
GLOBULIN, TOTAL: 2.7 g/dL (ref 1.5–4.5)
Glucose: 96 mg/dL (ref 65–99)
POTASSIUM: 4.9 mmol/L (ref 3.5–5.2)
SODIUM: 139 mmol/L (ref 134–144)
Total Protein: 7.2 g/dL (ref 6.0–8.5)

## 2017-06-27 LAB — CBC WITH DIFFERENTIAL/PLATELET
BASOS: 0 %
Basophils Absolute: 0 10*3/uL (ref 0.0–0.2)
EOS (ABSOLUTE): 0.2 10*3/uL (ref 0.0–0.4)
EOS: 3 %
HEMATOCRIT: 37.6 % (ref 37.5–51.0)
HEMOGLOBIN: 12.6 g/dL — AB (ref 13.0–17.7)
IMMATURE GRANULOCYTES: 0 %
Immature Grans (Abs): 0 10*3/uL (ref 0.0–0.1)
LYMPHS ABS: 4.5 10*3/uL — AB (ref 0.7–3.1)
Lymphs: 56 %
MCH: 29.6 pg (ref 26.6–33.0)
MCHC: 33.5 g/dL (ref 31.5–35.7)
MCV: 89 fL (ref 79–97)
MONOCYTES: 6 %
MONOS ABS: 0.5 10*3/uL (ref 0.1–0.9)
NEUTROS PCT: 35 %
Neutrophils Absolute: 2.8 10*3/uL (ref 1.4–7.0)
Platelets: 270 10*3/uL (ref 150–379)
RBC: 4.25 x10E6/uL (ref 4.14–5.80)
RDW: 14.4 % (ref 12.3–15.4)
WBC: 8.1 10*3/uL (ref 3.4–10.8)

## 2017-06-27 LAB — PSA, TOTAL AND FREE
PROSTATE SPECIFIC AG, SERUM: 3.6 ng/mL (ref 0.0–4.0)
PSA FREE PCT: 16.7 %
PSA FREE: 0.6 ng/mL

## 2017-06-27 LAB — LIPID PANEL
CHOL/HDL RATIO: 2.2 ratio (ref 0.0–5.0)
Cholesterol, Total: 93 mg/dL — ABNORMAL LOW (ref 100–199)
HDL: 43 mg/dL (ref >39)
LDL Calculated: 37 mg/dL (ref 0–99)
Triglycerides: 66 mg/dL (ref 0–149)
VLDL Cholesterol Cal: 13 mg/dL (ref 5–40)

## 2017-06-30 ENCOUNTER — Encounter: Payer: Self-pay | Admitting: Family Medicine

## 2017-07-08 ENCOUNTER — Other Ambulatory Visit: Payer: Self-pay | Admitting: *Deleted

## 2017-07-08 MED ORDER — LISINOPRIL 20 MG PO TABS: 20.0000 mg | ORAL_TABLET | Freq: Every day | ORAL | 1 refills | Status: DC

## 2017-07-11 DIAGNOSIS — E119 Type 2 diabetes mellitus without complications: Secondary | ICD-10-CM | POA: Diagnosis not present

## 2017-07-11 DIAGNOSIS — H40033 Anatomical narrow angle, bilateral: Secondary | ICD-10-CM | POA: Diagnosis not present

## 2017-07-13 ENCOUNTER — Other Ambulatory Visit: Payer: Self-pay | Admitting: Family Medicine

## 2017-07-13 NOTE — Telephone Encounter (Signed)
TC to [facility], they received 07/08/17 refill & it has been shipped out. Unable to reach patient, VM is full

## 2017-07-13 NOTE — Telephone Encounter (Signed)
What is the name of the medication? lisinopril  Have you contacted your pharmacy to request a refill? yes  Which [facility] would you [facility] sent [facility]   Patient notified that their request is being sent to the clinical staff for review and that they should receive a call once it is complete. If they do not receive a call within 24 hours they can check with their pharmacy or our office.

## 2017-07-14 NOTE — Telephone Encounter (Signed)
LMOVM that I talked with [facility] and refill was shipped, let us know if they need us for anything else

## 2017-07-22 ENCOUNTER — Ambulatory Visit: Payer: Medicare Other | Admitting: Cardiology

## 2017-07-23 ENCOUNTER — Encounter: Payer: Self-pay | Admitting: Cardiology

## 2017-07-23 ENCOUNTER — Ambulatory Visit: Payer: Medicare Other | Admitting: Cardiology

## 2017-07-23 VITALS — BP 128/64 | HR 78 | Ht 68.0 in | Wt 168.0 lb

## 2017-07-23 DIAGNOSIS — I2510 Atherosclerotic heart disease of native coronary artery without angina pectoris: Secondary | ICD-10-CM

## 2017-07-23 DIAGNOSIS — I10 Essential (primary) hypertension: Secondary | ICD-10-CM

## 2017-07-23 NOTE — Patient Instructions (Signed)

## 2017-07-23 NOTE — Progress Notes (Signed)
Clinical Summary Jesus Curtis is a 77 y.o.male seen for the following medical problems.   1. CAD - 11/2016 cath with severe multivessel disease, referred for CABG - 11/24/16 CABG x4 (left internal mammary artery to left anterior descending, saphenous vein graft to diagonal, saphenous vein graft to circumflex marginal, saphenous vein graft to posterior descending branch of the distal circumflex) - 11/2016 echo LVEF 55-60%, grade II diastolic dysfunction  - no recent chest pain. No SOB or DOE - compliant with meds   2. HTN - last visit increase lisinopril to 10m daily.  - remains compliant with meds     Lipids 06/2017 TC 93 TG 66 HDL 43 LDL 37 Past Medical History:  Diagnosis Date  . Abnormal stress test   . Aortic atherosclerosis ([facility]   . Chest pain   . Elevated hemoglobin A1c   . Elevated troponin   . Hypertension   . Nausea & vomiting   . Snake bite    AS A CHILD HAD PMH  . [place]) ([place]   . Syncope      Allergies  Allergen Reactions  . Ativan [Lorazepam] Other (See Comments)    "goes wild"     Current Outpatient Medications  Medication Sig Dispense Refill  . acetaminophen (TYLENOL) 500 MG tablet Take 1,000 mg by mouth every 6 (six) hours as needed for mild pain.    .Marland Kitchenaspirin EC 81 MG tablet Take 81 mg by mouth daily.    .Marland Kitchenatorvastatin (LIPITOR) 80 MG tablet TAKE 1 TABLET EVERY DAY AT 6PM 90 tablet 3  . blood glucose meter kit and supplies KIT Dispense based on patient and insurance preference. Use up to four times daily as directed. (FOR ICD-9 250.00, 250.01). 1 each 11  . GINSENG PO Take 2 tablets by mouth daily.    .Marland Kitchenglucose blood (ONE TOUCH ULTRA TEST) test strip Use to check blood sugars up to four times a day 400 each 1  . lisinopril (PRINIVIL,ZESTRIL) 20 MG tablet Take 1 tablet (20 mg total) by mouth daily. 90 tablet 1  . metFORMIN (GLUCOPHAGE-XR) 500 MG 24 hr tablet TAKE 1 TABLET BY MOUTH EVERY DAY WITH  BREAKFAST 90 tablet 0  . metoprolol tartrate (LOPRESSOR) 50 MG tablet Take 1 tablet (50 mg total) by mouth 2 (two) times daily. 180 tablet 3  . Multiple Vitamin (ONE-A-DAY MENS PO) Take 1 tablet by mouth daily.     No current facility-administered medications for this visit.      Past Surgical History:  Procedure Laterality Date  . CORONARY ARTERY BYPASS GRAFT N/A 11/24/2016   Procedure: CORONARY ARTERY BYPASS GRAFTING (CABG), ON PUMP, TIMES FOUR, USING LEFT INTERNAL MAMMARY ARTERY AND ENDOSCOPICALLY HARVESTED RIGHT GREATER SAPHENOUS VEIN;  Surgeon: VIvin Poot MD;  [facility] [facility]  Service: Open Heart Surgery;  Laterality: N/A;  LIMA to LAD SVG to PDA SVG to OM1 SVG to Diag1  . LEFT HEART CATH AND CORONARY ANGIOGRAPHY N/A 11/21/2016   Procedure: LEFT HEART CATH AND CORONARY ANGIOGRAPHY;  Surgeon: KTroy Sine MD;  [facility];  Service: Cardiovascular;  Laterality: N/A;  . TEE WITHOUT CARDIOVERSION N/A 11/24/2016   Procedure: TRANSESOPHAGEAL ECHOCARDIOGRAM (TEE);  Surgeon: VPrescott Gum PCollier Salina MD;  [facility]  Service: Open Heart Surgery;  Laterality: N/A;     Allergies  Allergen Reactions  . Ativan [Lorazepam] Other (See Comments)    "goes wild"      Family History  Problem Relation Age of Onset  . Heart attack Mother   . Heart disease Mother   . Hypertension Mother   . Heart attack Father   . Heart disease Father   . Hypertension Father   . Heart attack Brother   . Heart disease Brother   . Stroke Brother   . Stroke Maternal Grandmother   . Stroke Paternal Grandmother   . Heart disease Paternal Grandfather   . Hypertension Paternal Grandfather   . Heart attack Brother      Social History [name] reports that he has never smoked. He has never used smokeless tobacco. [name] reports that he does not drink alcohol.   Review of Systems CONSTITUTIONAL: No weight loss, fever, chills, weakness or fatigue.  HEENT: Eyes: No visual  loss, blurred vision, double vision or yellow sclerae.No hearing loss, sneezing, congestion, runny nose or sore throat.  SKIN: No rash or itching.  CARDIOVASCULAR: per hpi RESPIRATORY: No shortness of breath, cough or sputum.  GASTROINTESTINAL: No anorexia, nausea, vomiting or diarrhea. No abdominal pain or blood.  GENITOURINARY: No burning on urination, no polyuria NEUROLOGICAL: No headache, dizziness, syncope, paralysis, ataxia, numbness or tingling in the extremities. No change in bowel or bladder control.  MUSCULOSKELETAL: No muscle, back pain, joint pain or stiffness.  LYMPHATICS: No enlarged nodes. No history of splenectomy.  PSYCHIATRIC: No history of depression or anxiety.  ENDOCRINOLOGIC: No reports of sweating, cold or heat intolerance. No polyuria or polydipsia.  Marland Kitchen   Physical Examination Vitals:   07/23/17 1401  BP: 128/64  Pulse: 78  SpO2: 94%   Vitals:   07/23/17 1401  Weight: 168 lb (76.2 kg)  Height: '5\' 8"'  (1.727 m)    Gen: resting comfortably, no acute distress HEENT: no scleral icterus, pupils equal round and reactive, no palptable cervical adenopathy,  CV: RRR, no m/r/g, no jvd Resp: Clear to auscultation bilaterally GI: abdomen is soft, non-tender, non-distended, normal bowel sounds, no hepatosplenomegaly MSK: extremities are warm, no edema.  Skin: warm, no rash Neuro:  no focal deficits Psych: appropriate affect   Diagnostic Studies 11/2016 cath   LM lesion, 30 %stenosed.  Ost LAD to Prox LAD lesion, 30 %stenosed.  Mid LAD lesion, 90 %stenosed.  1st Mrg lesion, 50 %stenosed.  Ost Cx to Prox Cx lesion, 70 %stenosed.  Prox Cx to Dist Cx lesion, 70 %stenosed.  Ost RCA to Prox RCA lesion, 80 %stenosed.  Mid RCA lesion, 100 %stenosed.  LV end diastolic pressure is normal.   11/2016 echo   - Left ventricle: The cavity size was normal. Systolic function was normal. The estimated ejection fraction was in the range of 55% to 60%.  Wall motion was normal; there were no regional wall motion abnormalities. Features are consistent with a pseudonormal left ventricular filling pattern, with concomitant abnormal relaxation and increased filling pressure (grade 2 diastolic dysfunction). Doppler parameters are consistent with elevated mean left atrial filling pressure. - Left atrium: The atrium was mildly dilated. - Pericardium, extracardiac: A trivial pericardial effusion was identified.   11/2016 carotid US Findings suggest 1-39% internal carotid artery stenosis bilaterally. Vertebral arteries are patent with antegrade flow.     Assessment and Plan  1. CAD - doing well without symptoms, continue current meds  2. HTN - at goal, continue current meds    F/u 1 year       Arnoldo Lenis, M.D.

## 2017-07-24 ENCOUNTER — Telehealth: Payer: Self-pay | Admitting: Family Medicine

## 2017-07-24 ENCOUNTER — Encounter: Payer: Self-pay | Admitting: Family Medicine

## 2017-07-24 ENCOUNTER — Ambulatory Visit (INDEPENDENT_AMBULATORY_CARE_PROVIDER_SITE_OTHER): Payer: Medicare Other | Admitting: Family Medicine

## 2017-07-24 VITALS — BP 148/72 | HR 81 | Temp 97.0°F | Ht 68.0 in | Wt 169.1 lb

## 2017-07-24 DIAGNOSIS — K029 Dental caries, unspecified: Secondary | ICD-10-CM

## 2017-07-24 MED ORDER — AMOXICILLIN 875 MG PO TABS: 875.0000 mg | ORAL_TABLET | Freq: Two times a day (BID) | ORAL | 0 refills | Status: DC

## 2017-07-24 NOTE — Telephone Encounter (Signed)
Appointment scheduled this afternoon at 4:25 pm with Dr. Darlyn ReadStacks.

## 2017-07-24 NOTE — Telephone Encounter (Signed)
What symptoms do you have? Abscess tooth has swelling  How long have you been sick? Has been swelling this week  Have you been seen for this problem? No wants penicillin to help until he gets them pulled out  If your provider decides to give you a prescription, [facility]   Patient informed that this information will be sent to the clinical staff for review and that they should receive a follow up call.

## 2017-07-26 ENCOUNTER — Encounter: Payer: Self-pay | Admitting: Family Medicine

## 2017-07-26 NOTE — Progress Notes (Signed)
Chief Complaint  Patient presents with  . Dental Pain    HPI  Patient presents today for swelling and pain in the mouth.  Has poor dentition.  A tooth.  On the upper right has been swollen and painful earlier today.  It has diminished somewhat.  His wife would like him to have some antibiotics for him to take until he can get to a dentist.  She wants him to have all of his teeth pulled.  He expresses some reluctance today  PMH: Smoking status noted ROS: Per HPI  Objective: BP (!) 148/72   Pulse 81   Temp (!) 97 F (36.1 C) (Oral)   Ht 5\' 8"  (1.727 m)   Wt 169 lb 2 oz (76.7 kg)   BMI 25.72 kg/m  Gen: NAD, alert, cooperative with exam HEENT: NCAT, EOMI, PERRL the patient has multiple teeth missing.  The remaining 15-18  are worn down and have large amounts of visible caries.  There is moderate erythema in the right upper plate. CV: RRR, good S1/S2, no murmur Resp: CTABL, no wheezes, non-labored   Assessment and plan:  1. Dental caries     Meds ordered this encounter  Medications  . amoxicillin (AMOXIL) 875 MG tablet    Sig: Take 1 tablet (875 mg total) by mouth 2 (two) times daily.    Dispense:  20 tablet    Refill:  0    No orders of the defined types were placed in this encounter.   Follow up as needed.  Mechele ClaudeWarren Mackenzie Groom, MD

## 2017-07-27 ENCOUNTER — Encounter: Payer: Self-pay | Admitting: Cardiology

## 2017-08-28 ENCOUNTER — Other Ambulatory Visit: Payer: Self-pay | Admitting: *Deleted

## 2017-08-28 MED ORDER — METOPROLOL TARTRATE 50 MG PO TABS: 50.0000 mg | ORAL_TABLET | Freq: Two times a day (BID) | ORAL | 1 refills | Status: DC

## 2017-08-28 MED ORDER — ATORVASTATIN CALCIUM 80 MG PO TABS: ORAL_TABLET | ORAL | 1 refills | Status: DC

## 2017-09-28 ENCOUNTER — Encounter: Payer: Self-pay | Admitting: Family Medicine

## 2017-09-28 ENCOUNTER — Ambulatory Visit (INDEPENDENT_AMBULATORY_CARE_PROVIDER_SITE_OTHER): Payer: Medicare Other | Admitting: Family Medicine

## 2017-09-28 VITALS — BP 144/69 | HR 61 | Ht 68.0 in | Wt 167.0 lb

## 2017-09-28 DIAGNOSIS — I2510 Atherosclerotic heart disease of native coronary artery without angina pectoris: Secondary | ICD-10-CM

## 2017-09-28 DIAGNOSIS — I10 Essential (primary) hypertension: Secondary | ICD-10-CM | POA: Diagnosis not present

## 2017-09-28 DIAGNOSIS — R7303 Prediabetes: Secondary | ICD-10-CM

## 2017-09-28 DIAGNOSIS — Z23 Encounter for immunization: Secondary | ICD-10-CM

## 2017-09-28 LAB — BAYER DCA HB A1C WAIVED: HB A1C (BAYER DCA - WAIVED): 6.1 % (ref <7.0)

## 2017-09-28 MED ORDER — METFORMIN HCL ER 500 MG PO TB24: ORAL_TABLET | ORAL | 0 refills | Status: DC

## 2017-09-28 MED ORDER — LISINOPRIL 20 MG PO TABS: 20.0000 mg | ORAL_TABLET | Freq: Every day | ORAL | 1 refills | Status: DC

## 2017-09-28 NOTE — Patient Instructions (Signed)

## 2017-09-28 NOTE — Progress Notes (Signed)
Subjective:  Patient ID: Jesus Curtis, male    DOB: 10-24-1940  Age: 77 y.o. MRN: [id number]  CC: Medical Management of Chronic Issues   HPI Jesus Curtis presents for  follow-up of hypertension. Patient has no history of headache chest pain or shortness of breath or recent cough. Patient also denies symptoms of TIA such as focal numbness or weakness. Patient denies side effects from medication. States taking it regularly.  Patient says it usually in the 543K systolic when he checks it at home.  Follow-up of diabetes. Patient does not check blood sugar at home regularly. Can't remember any results. Patient denies symptoms such as polyuria, polydipsia, excessive hunger, nausea No significant hypoglycemic spells noted. Medications as noted below. Taking them regularly without complication/adverse reaction being reported today. Patient continues to take atorvastatin without side effects noted.  Results from 3 months ago reviewed with the patient for his lipid profile.  History Jesus Curtis has a past medical history of Abnormal stress test, Aortic atherosclerosis (Jesus Curtis), Chest pain, Elevated hemoglobin A1c, Elevated troponin, Hypertension, Nausea & vomiting, Snake bite, SVT (supraventricular tachycardia) (Jesus Curtis), and Syncope.   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   His family history includes Heart attack in his brother, brother, father, and mother; Heart disease in his brother, father, mother, and paternal grandfather; Hypertension in his father, mother, and paternal grandfather; Stroke in his brother, maternal grandmother, and paternal grandmother.He reports that he has never smoked. He has never used smokeless tobacco. He reports that he does not drink alcohol or use drugs.  Current Outpatient Medications on File Prior to Visit    Medication Sig Dispense Refill  . acetaminophen (TYLENOL) 500 MG tablet Take 1,000 mg by mouth every 6 (six) hours as needed for mild pain.    Marland Kitchen aspirin EC 81 MG tablet Take 81 mg by mouth daily.    Marland Kitchen atorvastatin (LIPITOR) 80 MG tablet TAKE 1 TABLET EVERY DAY AT 6PM 90 tablet 1  . blood glucose meter kit and supplies KIT Dispense based on patient and insurance preference. Use up to four times daily as directed. (FOR ICD-9 250.00, 250.01). 1 each 11  . GINSENG PO Take 2 tablets by mouth daily.    Marland Kitchen glucose blood (ONE TOUCH ULTRA TEST) test strip Use to check blood sugars up to four times a day 400 each 1  . metoprolol tartrate (LOPRESSOR) 50 MG tablet Take 1 tablet (50 mg total) by mouth 2 (two) times daily. 180 tablet 1  . Multiple Vitamin (ONE-A-DAY MENS PO) Take 1 tablet by mouth daily.     No current facility-administered medications on file prior to visit.     ROS Review of Systems  Constitutional: Negative for activity change, appetite change and fever.  HENT: Negative.   Respiratory: Negative for cough and shortness of breath.   Cardiovascular: Negative for chest pain, palpitations and leg swelling.  Gastrointestinal: Negative for abdominal distention, abdominal pain, constipation and diarrhea.  Musculoskeletal: Negative for arthralgias.  Skin: Negative for rash.    Objective:  BP (!) 144/69   Pulse 61   Ht _0  (1.727 m)   Wt 167 lb (75.8 kg)   BMI 25.39 kg/m   BP Readings from Last 3 Encounters:  09/28/17 (!) 144/69  07/24/17 (!) 148/72  07/23/17 128/64    Wt Readings from Last 3 Encounters:  09/28/17 167  lb (75.8 kg)  07/24/17 169 lb 2 oz (76.7 kg)  07/23/17 168 lb (76.2 kg)     Physical Exam  Constitutional: He is oriented to person, place, and time. He appears well-developed and well-nourished. No distress.  HENT:  Head: Normocephalic and atraumatic.  Right Ear: External ear normal.  Left Ear: External ear normal.  Nose: Nose normal.  Mouth/Throat:  Oropharynx is clear and moist.  Eyes: Pupils are equal, round, and reactive to light. Conjunctivae and EOM are normal.  Neck: Normal range of motion. Neck supple. No thyromegaly present.  Cardiovascular: Normal rate, regular rhythm and normal heart sounds.  No murmur heard. Pulmonary/Chest: Effort normal and breath sounds normal. No respiratory distress. He has no wheezes. He has no rales.  Abdominal: Soft. Bowel sounds are normal.  Lymphadenopathy:    He has no cervical adenopathy.  Neurological: He is alert and oriented to person, place, and time. He has normal reflexes.  Skin: Skin is warm and dry.  Psychiatric: He has a normal mood and affect. His behavior is normal. Judgment and thought content normal.      Assessment & Plan:   Jesus Curtis was seen today for medical management of chronic issues.  Diagnoses and all orders for this visit:  Essential hypertension  Borderline diabetes mellitus -     Microalbumin / creatinine urine ratio -     Urinalysis -     Bayer DCA Hb A1c Waived  ASCVD (arteriosclerotic cardiovascular disease)  Other orders -     lisinopril (PRINIVIL,ZESTRIL) 20 MG tablet; Take 1 tablet (20 mg total) by mouth daily. -     metFORMIN (GLUCOPHAGE-XR) 500 MG 24 hr tablet; TAKE 1 TABLET BY MOUTH EVERY DAY WITH BREAKFAST -     Pneumococcal conjugate vaccine 13-valent   Allergies as of 09/28/2017      Reactions   Ativan [lorazepam] Other (See Comments)   "goes wild"      Medication List        Accurate as of 09/28/17  5:16 PM. Always use your most recent med list.          acetaminophen 500 MG tablet Commonly known as:  TYLENOL Take 1,000 mg by mouth every 6 (six) hours as needed for mild pain.   aspirin EC 81 MG tablet Take 81 mg by mouth daily.   atorvastatin 80 MG tablet Commonly known as:  LIPITOR TAKE 1 TABLET EVERY DAY AT 6PM   blood glucose meter kit and supplies Kit Dispense based on patient and insurance preference. Use up to four times  daily as directed. (FOR ICD-9 250.00, 250.01).   GINSENG PO Take 2 tablets by mouth daily.   glucose blood test strip Commonly known as:  ONE TOUCH ULTRA TEST Use to check blood sugars up to four times a day   lisinopril 20 MG tablet Commonly known as:  PRINIVIL,ZESTRIL Take 1 tablet (20 mg total) by mouth daily.   metFORMIN 500 MG 24 hr tablet Commonly known as:  GLUCOPHAGE-XR TAKE 1 TABLET BY MOUTH EVERY DAY WITH BREAKFAST   metoprolol tartrate 50 MG tablet Commonly known as:  LOPRESSOR Take 1 tablet (50 mg total) by mouth 2 (two) times daily.   ONE-A-DAY MENS PO Take 1 tablet by mouth daily.       Meds ordered this encounter  Medications  . lisinopril (PRINIVIL,ZESTRIL) 20 MG tablet    Sig: Take 1 tablet (20 mg total) by mouth daily.    Dispense:  90 tablet  Refill:  1  . metFORMIN (GLUCOPHAGE-XR) 500 MG 24 hr tablet    Sig: TAKE 1 TABLET BY MOUTH EVERY DAY WITH BREAKFAST    Dispense:  90 tablet    Refill:  0    Reminder to be seen before next refill    Follow-up: Return in about 3 months (around 12/29/2017) for diabetes, cholesterol.  Claretta Fraise, M.D.

## 2017-09-29 ENCOUNTER — Other Ambulatory Visit: Payer: Medicare Other

## 2017-09-29 DIAGNOSIS — R7303 Prediabetes: Secondary | ICD-10-CM | POA: Diagnosis not present

## 2017-09-29 LAB — URINALYSIS
BILIRUBIN UA: NEGATIVE
Glucose, UA: NEGATIVE
KETONES UA: NEGATIVE
Leukocytes, UA: NEGATIVE
NITRITE UA: NEGATIVE
Protein, UA: NEGATIVE
RBC UA: NEGATIVE
SPEC GRAV UA: 1.02 (ref 1.005–1.030)
UUROB: 1 mg/dL (ref 0.2–1.0)
pH, UA: 7 (ref 5.0–7.5)

## 2017-09-30 LAB — MICROALBUMIN / CREATININE URINE RATIO
CREATININE, UR: 169.7 mg/dL
MICROALB/CREAT RATIO: 5.4 mg/g{creat} (ref 0.0–30.0)
MICROALBUM., U, RANDOM: 9.1 ug/mL

## 2017-12-29 ENCOUNTER — Encounter: Payer: Self-pay | Admitting: Family Medicine

## 2017-12-29 ENCOUNTER — Ambulatory Visit (INDEPENDENT_AMBULATORY_CARE_PROVIDER_SITE_OTHER): Payer: Medicare Other | Admitting: Family Medicine

## 2017-12-29 VITALS — BP 143/64 | HR 61 | Temp 97.1°F | Ht 68.0 in | Wt 172.0 lb

## 2017-12-29 DIAGNOSIS — I2510 Atherosclerotic heart disease of native coronary artery without angina pectoris: Secondary | ICD-10-CM | POA: Diagnosis not present

## 2017-12-29 DIAGNOSIS — R7303 Prediabetes: Secondary | ICD-10-CM | POA: Diagnosis not present

## 2017-12-29 LAB — BAYER DCA HB A1C WAIVED: HB A1C (BAYER DCA - WAIVED): 6.4 % (ref <7.0)

## 2017-12-29 MED ORDER — METFORMIN HCL ER 500 MG PO TB24: ORAL_TABLET | ORAL | 1 refills | Status: DC

## 2017-12-29 MED ORDER — ATORVASTATIN CALCIUM 80 MG PO TABS: ORAL_TABLET | ORAL | 1 refills | Status: DC

## 2017-12-29 MED ORDER — METOPROLOL TARTRATE 50 MG PO TABS: 50.0000 mg | ORAL_TABLET | Freq: Two times a day (BID) | ORAL | 1 refills | Status: DC

## 2017-12-29 NOTE — Progress Notes (Signed)
Subjective:  Patient ID: Jesus Curtis, male    DOB: Jan 24, 1941  Age: 77 y.o. MRN: [id number]  CC: Medical Management of Chronic Issues   HPI [name] presents forFollow-up of diabetes. Patient not checking blood sugar at home.  Patient denies symptoms such as polyuria, polydipsia, excessive hunger, nausea No significant hypoglycemic spells noted.  Denies numbness or tingling of neuropathy.  He does try to follow a reasonable diet admits he should eat more green vegetables. Medications reviewed. Pt reports taking them regularly without complication/adverse reaction being reported today.    History [name] has a past medical history of Abnormal stress test, Aortic atherosclerosis ([place]), Chest pain, Elevated hemoglobin A1c, Elevated troponin, Hypertension, Nausea & vomiting, Snake bite, SVT (supraventricular tachycardia) ([place]), and Syncope.   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   His family history includes Heart attack in his brother, brother, father, and mother; Heart disease in his brother, father, mother, and paternal grandfather; Hypertension in his father, mother, and paternal grandfather; Stroke in his brother, maternal grandmother, and paternal grandmother.He reports that he has never smoked. He has never used smokeless tobacco. He reports that he does not drink alcohol or use drugs.  Current Outpatient Medications on File Prior to Visit  Medication Sig Dispense Refill  . acetaminophen (TYLENOL) 500 MG tablet Take 1,000 mg by mouth every 6 (six) hours as needed for mild pain.    Marland Kitchen aspirin EC 81 MG tablet Take 81 mg by mouth daily.    . blood glucose meter kit and supplies KIT Dispense based on patient and insurance preference. Use up to four times daily as directed. (FOR ICD-9 250.00, 250.01). 1 each 11  . GINSENG  PO Take 2 tablets by mouth daily.    Marland Kitchen glucose blood (ONE TOUCH ULTRA TEST) test strip Use to check blood sugars up to four times a day 400 each 1  . lisinopril (PRINIVIL,ZESTRIL) 20 MG tablet Take 1 tablet (20 mg total) by mouth daily. 90 tablet 1  . Multiple Vitamin (ONE-A-DAY MENS PO) Take 1 tablet by mouth daily.     No current facility-administered medications on file prior to visit.     ROS Review of Systems  Constitutional: Negative.   HENT: Negative.   Eyes: Negative for visual disturbance.  Respiratory: Negative for cough and shortness of breath.   Cardiovascular: Negative for chest pain and leg swelling.  Gastrointestinal: Negative for abdominal pain, diarrhea, nausea and vomiting.  Genitourinary: Negative for difficulty urinating.  Musculoskeletal: Negative for arthralgias and myalgias.  Skin: Negative for rash.  Neurological: Negative for headaches.  Psychiatric/Behavioral: Negative for sleep disturbance.    Objective:  BP (!) 143/64   Pulse 61   Temp (!) 97.1 F (36.2 C) (Oral)   Ht '5\' 8"'  (1.727 m)   Wt 172 lb (78 kg)   BMI 26.15 kg/m   BP Readings from Last 3 Encounters:  12/29/17 (!) 143/64  09/28/17 (!) 144/69  07/24/17 (!) 148/72    Wt Readings from Last 3 Encounters:  12/29/17 172 lb (78 kg)  09/28/17 167 lb (75.8 kg)  07/24/17 169 lb 2 oz (76.7 kg)     Physical Exam  Constitutional: He is oriented to person, place, and time. He appears well-developed and well-nourished. No distress.  HENT:  Head: Normocephalic and atraumatic.  Right Ear: External ear normal.  Left Ear:  External ear normal.  Nose: Nose normal.  Mouth/Throat: Oropharynx is clear and moist.  Eyes: Pupils are equal, round, and reactive to light. Conjunctivae and EOM are normal.  Neck: Normal range of motion. Neck supple.  Cardiovascular: Normal rate, regular rhythm and normal heart sounds.  No murmur heard. Pulmonary/Chest: Effort normal and breath sounds normal. No respiratory  distress. He has no wheezes. He has no rales.  Abdominal: Soft. There is no tenderness.  Musculoskeletal: Normal range of motion.  Neurological: He is alert and oriented to person, place, and time. He has normal reflexes.  Skin: Skin is warm and dry.  Psychiatric: He has a normal mood and affect. His behavior is normal. Judgment and thought content normal.    Results for orders placed or performed in visit on 12/29/17  Bayer DCA Hb A1c Waived  Result Value Ref Range   HB A1C (BAYER DCA - WAIVED) 6.4 <7.0 %     Assessment & Plan:   Mutasim was seen today for medical management of chronic issues.  Diagnoses and all orders for this visit:  Borderline diabetes mellitus -     CBC with Differential/Platelet -     CMP14+EGFR -     Bayer DCA Hb A1c Waived  ASCVD (arteriosclerotic cardiovascular disease) -     CBC with Differential/Platelet -     CMP14+EGFR -     Lipid panel  Other orders -     metFORMIN (GLUCOPHAGE-XR) 500 MG 24 hr tablet; TAKE 1 TABLET BY MOUTH EVERY DAY WITH BREAKFAST -     metoprolol tartrate (LOPRESSOR) 50 MG tablet; Take 1 tablet (50 mg total) by mouth 2 (two) times daily. -     atorvastatin (LIPITOR) 80 MG tablet; TAKE 1 TABLET EVERY DAY AT 6PM      I am having [name] maintain his Multiple Vitamin (ONE-A-DAY MENS PO), acetaminophen, GINSENG PO, aspirin EC, blood glucose meter kit and supplies, glucose blood, lisinopril, metFORMIN, metoprolol tartrate, and atorvastatin.  Meds ordered this encounter  Medications  . metFORMIN (GLUCOPHAGE-XR) 500 MG 24 hr tablet    Sig: TAKE 1 TABLET BY MOUTH EVERY DAY WITH BREAKFAST    Dispense:  90 tablet    Refill:  1  . metoprolol tartrate (LOPRESSOR) 50 MG tablet    Sig: Take 1 tablet (50 mg total) by mouth 2 (two) times daily.    Dispense:  180 tablet    Refill:  1  . atorvastatin (LIPITOR) 80 MG tablet    Sig: TAKE 1 TABLET EVERY DAY AT 6PM    Dispense:  90 tablet    Refill:  1     Follow-up: Return  in about 3 months (around 03/30/2018).  Claretta Fraise, M.D.

## 2017-12-30 LAB — CBC WITH DIFFERENTIAL/PLATELET
BASOS: 1 %
Basophils Absolute: 0.1 10*3/uL (ref 0.0–0.2)
EOS (ABSOLUTE): 0.3 10*3/uL (ref 0.0–0.4)
Eos: 5 %
Hematocrit: 36.9 % — ABNORMAL LOW (ref 37.5–51.0)
Hemoglobin: 12.4 g/dL — ABNORMAL LOW (ref 13.0–17.7)
IMMATURE GRANS (ABS): 0 10*3/uL (ref 0.0–0.1)
IMMATURE GRANULOCYTES: 0 %
LYMPHS: 50 %
Lymphocytes Absolute: 3.3 10*3/uL — ABNORMAL HIGH (ref 0.7–3.1)
MCH: 30 pg (ref 26.6–33.0)
MCHC: 33.6 g/dL (ref 31.5–35.7)
MCV: 89 fL (ref 79–97)
Monocytes Absolute: 0.4 10*3/uL (ref 0.1–0.9)
Monocytes: 6 %
NEUTROS PCT: 38 %
Neutrophils Absolute: 2.4 10*3/uL (ref 1.4–7.0)
PLATELETS: 231 10*3/uL (ref 150–450)
RBC: 4.14 x10E6/uL (ref 4.14–5.80)
RDW: 13.2 % (ref 12.3–15.4)
WBC: 6.4 10*3/uL (ref 3.4–10.8)

## 2017-12-30 LAB — LIPID PANEL
CHOL/HDL RATIO: 2.6 ratio (ref 0.0–5.0)
Cholesterol, Total: 93 mg/dL — ABNORMAL LOW (ref 100–199)
HDL: 36 mg/dL — AB (ref >39)
LDL Calculated: 25 mg/dL (ref 0–99)
Triglycerides: 159 mg/dL — ABNORMAL HIGH (ref 0–149)
VLDL Cholesterol Cal: 32 mg/dL (ref 5–40)

## 2017-12-30 LAB — CMP14+EGFR
ALT: 16 IU/L (ref 0–44)
AST: 18 IU/L (ref 0–40)
Albumin/Globulin Ratio: 1.9 (ref 1.2–2.2)
Albumin: 4.1 g/dL (ref 3.5–4.8)
Alkaline Phosphatase: 64 IU/L (ref 39–117)
BUN/Creatinine Ratio: 10 (ref 10–24)
BUN: 8 mg/dL (ref 8–27)
Bilirubin Total: 0.3 mg/dL (ref 0.0–1.2)
CALCIUM: 9.6 mg/dL (ref 8.6–10.2)
CO2: 23 mmol/L (ref 20–29)
CREATININE: 0.8 mg/dL (ref 0.76–1.27)
Chloride: 105 mmol/L (ref 96–106)
GFR, EST AFRICAN AMERICAN: 101 mL/min/{1.73_m2} (ref >59)
GFR, EST NON AFRICAN AMERICAN: 87 mL/min/{1.73_m2} (ref >59)
GLUCOSE: 117 mg/dL — AB (ref 65–99)
Globulin, Total: 2.2 g/dL (ref 1.5–4.5)
Potassium: 4.7 mmol/L (ref 3.5–5.2)
SODIUM: 141 mmol/L (ref 134–144)
Total Protein: 6.3 g/dL (ref 6.0–8.5)

## 2018-01-01 ENCOUNTER — Other Ambulatory Visit: Payer: Self-pay | Admitting: Family Medicine

## 2018-01-02 ENCOUNTER — Other Ambulatory Visit: Payer: Self-pay | Admitting: Family Medicine

## 2018-01-06 ENCOUNTER — Other Ambulatory Visit: Payer: Self-pay | Admitting: Family Medicine

## 2018-03-15 DIAGNOSIS — H52223 Regular astigmatism, bilateral: Secondary | ICD-10-CM | POA: Diagnosis not present

## 2018-03-30 ENCOUNTER — Ambulatory Visit (INDEPENDENT_AMBULATORY_CARE_PROVIDER_SITE_OTHER): Payer: Medicare Other | Admitting: Family Medicine

## 2018-03-30 ENCOUNTER — Encounter: Payer: Self-pay | Admitting: Family Medicine

## 2018-03-30 VITALS — BP 164/86 | HR 91 | Temp 97.5°F | Ht 68.0 in | Wt 170.6 lb

## 2018-03-30 DIAGNOSIS — I2510 Atherosclerotic heart disease of native coronary artery without angina pectoris: Secondary | ICD-10-CM | POA: Diagnosis not present

## 2018-03-30 DIAGNOSIS — R7303 Prediabetes: Secondary | ICD-10-CM | POA: Diagnosis not present

## 2018-03-30 DIAGNOSIS — I10 Essential (primary) hypertension: Secondary | ICD-10-CM | POA: Diagnosis not present

## 2018-03-30 LAB — BAYER DCA HB A1C WAIVED: HB A1C (BAYER DCA - WAIVED): 6.2 % (ref <7.0)

## 2018-03-30 MED ORDER — METOPROLOL TARTRATE 50 MG PO TABS: 50.0000 mg | ORAL_TABLET | Freq: Two times a day (BID) | ORAL | 1 refills | Status: DC

## 2018-03-30 MED ORDER — LISINOPRIL 20 MG PO TABS: 20.0000 mg | ORAL_TABLET | Freq: Every day | ORAL | 1 refills | Status: DC

## 2018-03-30 MED ORDER — ATORVASTATIN CALCIUM 80 MG PO TABS: ORAL_TABLET | ORAL | 1 refills | Status: DC

## 2018-03-30 MED ORDER — METFORMIN HCL ER 500 MG PO TB24: ORAL_TABLET | ORAL | 1 refills | Status: DC

## 2018-03-30 MED ORDER — METOPROLOL TARTRATE 100 MG PO TABS: 100.0000 mg | ORAL_TABLET | Freq: Two times a day (BID) | ORAL | 5 refills | Status: DC

## 2018-03-30 NOTE — Addendum Note (Signed)
Addended by: Mechele ClaudeSTACKS, Jalecia Leon on: 03/30/2018 06:21 PM   Modules accepted: Orders

## 2018-03-30 NOTE — Progress Notes (Addendum)
Subjective:  Patient ID: Jesus Curtis,  male    DOB: 1940-09-24  Age: 77 y.o.    CC: Medical Management of Chronic Issues   HPI Jesus Curtis presents for  follow-up of hypertension. Patient has no history of headache chest pain or shortness of breath or recent cough. Patient also denies symptoms of TIA such as numbness weakness lateralizing. Patient denies side effects from medication. States taking it regularly.  Patient also  in for follow-up of elevated cholesterol. Doing well without complaints on current medication. Denies side effects  including myalgia and arthralgia and nausea. Also in today for liver function testing. Currently no chest pain, shortness of breath or other cardiovascular related symptoms noted.  Follow-up of diabetes. Patient rarely checks his blood sugar.  He says when he does check its "normal, what ever that is."  Patient denies symptoms such as excessive hunger or urinary frequency, excessive hunger, nausea No significant hypoglycemic spells noted. Medications reviewed. Pt reports taking them regularly. Pt. denies complication/adverse reaction today.    History Jesus Curtis has a past medical history of Abnormal stress test, Aortic atherosclerosis ([place]), Chest pain, Elevated hemoglobin A1c, Elevated troponin, Hypertension, Nausea & vomiting, Snake bite, SVT (supraventricular tachycardia) ([place]), and Syncope.   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   His family history includes Heart attack in his brother, brother, father, and mother; Heart disease in his brother, father, mother, and paternal grandfather; Hypertension in his father, mother, and paternal grandfather; Stroke in his brother, maternal grandmother, and paternal grandmother.He reports that he has never smoked. He has never used smokeless  tobacco. He reports that he does not drink alcohol or use drugs.  Current Outpatient Medications on File Prior to Visit  Medication Sig Dispense Refill  . acetaminophen (TYLENOL) 500 MG tablet Take 1,000 mg by mouth every 6 (six) hours as needed for mild pain.    Marland Kitchen aspirin EC 81 MG tablet Take 81 mg by mouth daily.    . blood glucose meter kit and supplies KIT Dispense based on patient and insurance preference. Use up to four times daily as directed. (FOR ICD-9 250.00, 250.01). 1 each 11  . GINSENG PO Take 2 tablets by mouth daily.    Marland Kitchen glucose blood (ONE TOUCH ULTRA TEST) test strip Use to check blood sugars up to four times a day 400 each 1  . Multiple Vitamin (ONE-A-DAY MENS PO) Take 1 tablet by mouth daily.     No current facility-administered medications on file prior to visit.     ROS Review of Systems  Constitutional: Negative.   HENT: Negative.   Eyes: Negative for visual disturbance.  Respiratory: Negative for cough and shortness of breath.   Cardiovascular: Negative for chest pain and leg swelling.  Gastrointestinal: Negative for abdominal pain, diarrhea, nausea and vomiting.  Genitourinary: Negative for difficulty urinating.  Musculoskeletal: Negative for arthralgias and myalgias.  Skin: Negative for rash.  Neurological: Negative for headaches.  Psychiatric/Behavioral: Negative for sleep disturbance.    Objective:  BP (!) 164/86   Pulse 91   Temp (!) 97.5 F (36.4 C) (Oral)   Ht '5\' 8"'  (1.727 m)   Wt 170 lb 9.6 oz (77.4 kg)   BMI 25.94 kg/m   BP Readings from Last 3 Encounters:  03/30/18 (!) 164/86  12/29/17 (!) 143/64  09/28/17 (!) 144/69    Wt  Readings from Last 3 Encounters:  03/30/18 170 lb 9.6 oz (77.4 kg)  12/29/17 172 lb (78 kg)  09/28/17 167 lb (75.8 kg)     Physical Exam  Constitutional: He is oriented to person, place, and time. He appears well-developed and well-nourished. No distress.  HENT:  Head: Normocephalic and atraumatic.  Right Ear:  External ear normal.  Left Ear: External ear normal.  Nose: Nose normal.  Mouth/Throat: Oropharynx is clear and moist.  Eyes: Pupils are equal, round, and reactive to light. Conjunctivae and EOM are normal.  Neck: Normal range of motion. Neck supple.  Cardiovascular: Normal rate, regular rhythm and normal heart sounds.  No murmur heard. Pulmonary/Chest: Effort normal and breath sounds normal. No respiratory distress. He has no wheezes. He has no rales.  Abdominal: Soft. There is no tenderness.  Musculoskeletal: Normal range of motion.  Neurological: He is alert and oriented to person, place, and time. He has normal reflexes.  Skin: Skin is warm and dry.  Psychiatric: He has a normal mood and affect. His behavior is normal. Judgment and thought content normal.    Diabetic Foot Exam - Simple   No data filed        Assessment & Plan:   Jesus Curtis was seen today for medical management of chronic issues.  Diagnoses and all orders for this visit:  Borderline diabetes mellitus -     CMP14+EGFR -     Bayer DCA Hb A1c Waived  Essential hypertension -     CMP14+EGFR  ASCVD (arteriosclerotic cardiovascular disease) -     CMP14+EGFR  Other orders -     Discontinue: metoprolol tartrate (LOPRESSOR) 50 MG tablet; Take 1 tablet (50 mg total) by mouth 2 (two) times daily. -     metFORMIN (GLUCOPHAGE-XR) 500 MG 24 hr tablet; TAKE 1 TABLET BY MOUTH EVERY DAY WITH BREAKFAST -     lisinopril (PRINIVIL,ZESTRIL) 20 MG tablet; Take 1 tablet (20 mg total) by mouth daily. -     atorvastatin (LIPITOR) 80 MG tablet; TAKE 1 TABLET EVERY DAY AT 6PM -     metoprolol tartrate (LOPRESSOR) 100 MG tablet; Take 1 tablet (100 mg total) by mouth 2 (two) times daily.   I have discontinued Jesus Curtis's metoprolol tartrate. I have also changed his metoprolol tartrate. Additionally, I am having him maintain his Multiple Vitamin (ONE-A-DAY MENS PO), acetaminophen, GINSENG PO, aspirin EC, blood glucose meter kit  and supplies, glucose blood, metFORMIN, lisinopril, and atorvastatin.  Meds ordered this encounter  Medications  . DISCONTD: metoprolol tartrate (LOPRESSOR) 50 MG tablet    Sig: Take 1 tablet (50 mg total) by mouth 2 (two) times daily.    Dispense:  180 tablet    Refill:  1  . metFORMIN (GLUCOPHAGE-XR) 500 MG 24 hr tablet    Sig: TAKE 1 TABLET BY MOUTH EVERY DAY WITH BREAKFAST    Dispense:  90 tablet    Refill:  1  . lisinopril (PRINIVIL,ZESTRIL) 20 MG tablet    Sig: Take 1 tablet (20 mg total) by mouth daily.    Dispense:  90 tablet    Refill:  1  . atorvastatin (LIPITOR) 80 MG tablet    Sig: TAKE 1 TABLET EVERY DAY AT 6PM    Dispense:  90 tablet    Refill:  1  . metoprolol tartrate (LOPRESSOR) 100 MG tablet    Sig: Take 1 tablet (100 mg total) by mouth 2 (two) times daily.    Dispense:  60 tablet  Refill:  5     Follow-up: Return in about 6 months (around 09/29/2018).  Claretta Fraise, M.D.

## 2018-03-31 LAB — CMP14+EGFR
A/G RATIO: 2.3 — AB (ref 1.2–2.2)
ALBUMIN: 4.5 g/dL (ref 3.5–4.8)
ALT: 17 IU/L (ref 0–44)
AST: 20 IU/L (ref 0–40)
Alkaline Phosphatase: 74 IU/L (ref 39–117)
BILIRUBIN TOTAL: 0.5 mg/dL (ref 0.0–1.2)
BUN / CREAT RATIO: 9 — AB (ref 10–24)
BUN: 8 mg/dL (ref 8–27)
CO2: 20 mmol/L (ref 20–29)
CREATININE: 0.88 mg/dL (ref 0.76–1.27)
Calcium: 9.3 mg/dL (ref 8.6–10.2)
Chloride: 105 mmol/L (ref 96–106)
GFR calc non Af Amer: 84 mL/min/{1.73_m2} (ref >59)
GFR, EST AFRICAN AMERICAN: 97 mL/min/{1.73_m2} (ref >59)
GLOBULIN, TOTAL: 2 g/dL (ref 1.5–4.5)
Glucose: 94 mg/dL (ref 65–99)
POTASSIUM: 4.4 mmol/L (ref 3.5–5.2)
Sodium: 139 mmol/L (ref 134–144)
TOTAL PROTEIN: 6.5 g/dL (ref 6.0–8.5)

## 2018-03-31 NOTE — Progress Notes (Signed)
Hello Caprice,  Your lab result is normal.Some minor variations that are not significant are commonly marked abnormal, but do not represent any medical problem for you.  Best regards, Veasna Santibanez, M.D.

## 2018-07-02 ENCOUNTER — Encounter: Payer: Self-pay | Admitting: Family Medicine

## 2018-07-02 ENCOUNTER — Other Ambulatory Visit: Payer: Self-pay

## 2018-07-02 ENCOUNTER — Ambulatory Visit (INDEPENDENT_AMBULATORY_CARE_PROVIDER_SITE_OTHER): Payer: Medicare Other | Admitting: Family Medicine

## 2018-07-02 VITALS — BP 133/72 | HR 56 | Temp 97.4°F | Ht 68.0 in | Wt 170.5 lb

## 2018-07-02 DIAGNOSIS — E785 Hyperlipidemia, unspecified: Secondary | ICD-10-CM | POA: Diagnosis not present

## 2018-07-02 DIAGNOSIS — I10 Essential (primary) hypertension: Secondary | ICD-10-CM | POA: Diagnosis not present

## 2018-07-02 DIAGNOSIS — E1169 Type 2 diabetes mellitus with other specified complication: Secondary | ICD-10-CM | POA: Diagnosis not present

## 2018-07-02 DIAGNOSIS — R7303 Prediabetes: Secondary | ICD-10-CM | POA: Diagnosis not present

## 2018-07-02 LAB — BAYER DCA HB A1C WAIVED: HB A1C (BAYER DCA - WAIVED): 6.3 % (ref <7.0)

## 2018-07-02 MED ORDER — METOPROLOL TARTRATE 100 MG PO TABS: 100.0000 mg | ORAL_TABLET | Freq: Two times a day (BID) | ORAL | 1 refills | Status: DC

## 2018-07-02 MED ORDER — ATORVASTATIN CALCIUM 80 MG PO TABS: ORAL_TABLET | ORAL | 1 refills | Status: DC

## 2018-07-02 MED ORDER — METFORMIN HCL ER 500 MG PO TB24: ORAL_TABLET | ORAL | 1 refills | Status: DC

## 2018-07-02 MED ORDER — LISINOPRIL 20 MG PO TABS: 20.0000 mg | ORAL_TABLET | Freq: Every day | ORAL | 1 refills | Status: DC

## 2018-07-02 NOTE — Progress Notes (Signed)
Subjective:  Patient ID: Jesus Curtis,  male    DOB: 08/16/1940  Age: 78 y.o.    CC: Medical Management of Chronic Issues   HPI Jesus Curtis presents for  follow-up of hypertension. Patient has no history of headache chest pain or shortness of breath or recent cough. Patient also denies symptoms of TIA such as numbness weakness lateralizing. Patient denies side effects from medication. States taking it regularly.  Patient also  in for follow-up of elevated cholesterol. Doing well without complaints on current medication. Denies side effects  including myalgia and arthralgia and nausea. Also in today for liver function testing. Currently no chest pain, shortness of breath or other cardiovascular related symptoms noted.  Follow-up of diabetes. Patient does check blood sugar at home. Readings run around 160. Patient denies symptoms such as excessive hunger or urinary frequency, excessive hunger, nausea No significant hypoglycemic spells noted. Medications reviewed. Pt reports taking them regularly. Pt. denies complication/adverse reaction today.    History Jesus Curtis has a past medical history of Abnormal stress test, Aortic atherosclerosis ([place]), Chest pain, Elevated hemoglobin A1c, Elevated troponin, Hypertension, Nausea & vomiting, Snake bite, SVT (supraventricular tachycardia) ([place]), and Syncope.   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   His family history includes Heart attack in his brother, brother, father, and mother; Heart disease in his brother, father, mother, and paternal grandfather; Hypertension in his father, mother, and paternal grandfather; Stroke in his brother, maternal grandmother, and paternal grandmother.Jesus Curtis reports that Jesus Curtis has never smoked. Jesus Curtis has never used smokeless tobacco. Jesus Curtis reports that Jesus Curtis does not drink  alcohol or use drugs.  Current Outpatient Medications on File Prior to Visit  Medication Sig Dispense Refill  . acetaminophen (TYLENOL) 500 MG tablet Take 1,000 mg by mouth every 6 (six) hours as needed for mild pain.    Marland Kitchen aspirin EC 81 MG tablet Take 81 mg by mouth daily.    . blood glucose meter kit and supplies KIT Dispense based on patient and insurance preference. Use up to four times daily as directed. (FOR ICD-9 250.00, 250.01). 1 each 11  . GINSENG PO Take 2 tablets by mouth daily.    Marland Kitchen glucose blood (ONE TOUCH ULTRA TEST) test strip Use to check blood sugars up to four times a day 400 each 1  . Multiple Vitamin (ONE-A-DAY MENS PO) Take 1 tablet by mouth daily.     No current facility-administered medications on file prior to visit.     ROS Review of Systems  Constitutional: Negative.   HENT: Negative.   Eyes: Negative for visual disturbance.  Respiratory: Negative for cough and shortness of breath.   Cardiovascular: Negative for chest pain and leg swelling.  Gastrointestinal: Negative for abdominal pain, diarrhea, nausea and vomiting.  Genitourinary: Negative for difficulty urinating.  Musculoskeletal: Negative for arthralgias and myalgias.  Skin: Negative for rash.  Neurological: Negative for headaches.  Psychiatric/Behavioral: Negative for sleep disturbance.    Objective:  BP 133/72   Pulse (!) 56   Temp (!) 97.4 F (36.3 C) (Oral)   Ht '5\' 8"'  (1.727 m)   Wt 170 lb 8 oz (77.3 kg)   BMI 25.92 kg/m   BP Readings from Last 3 Encounters:  07/02/18 133/72  03/30/18 (!) 164/86  12/29/17 (!) 143/64    Wt Readings from Last 3 Encounters:  07/02/18 170 lb 8  oz (77.3 kg)  03/30/18 170 lb 9.6 oz (77.4 kg)  12/29/17 172 lb (78 kg)     Physical Exam Constitutional:      General: Jesus Curtis is not in acute distress.    Appearance: Jesus Curtis is well-developed.  HENT:     Head: Normocephalic and atraumatic.     Right Ear: External ear normal.     Left Ear: External ear normal.      Nose: Nose normal.  Eyes:     Conjunctiva/sclera: Conjunctivae normal.     Pupils: Pupils are equal, round, and reactive to light.  Neck:     Musculoskeletal: Normal range of motion and neck supple.  Cardiovascular:     Rate and Rhythm: Normal rate and regular rhythm.     Heart sounds: Normal heart sounds. No murmur.  Pulmonary:     Effort: Pulmonary effort is normal. No respiratory distress.     Breath sounds: Normal breath sounds. No wheezing or rales.  Abdominal:     Palpations: Abdomen is soft.     Tenderness: There is no abdominal tenderness.  Musculoskeletal: Normal range of motion.  Skin:    General: Skin is warm and dry.  Neurological:     Mental Status: Jesus Curtis is alert and oriented to person, place, and time.     Deep Tendon Reflexes: Reflexes are normal and symmetric.  Psychiatric:        Behavior: Behavior normal.        Thought Content: Thought content normal.        Judgment: Judgment normal.     Diabetic Foot Exam - Simple   Simple Foot Form Diabetic Foot exam was performed with the following findings:  Yes 07/02/2018  4:00 PM  Visual Inspection No deformities, no ulcerations, no other skin breakdown bilaterally:  Yes Sensation Testing Intact to touch and monofilament testing bilaterally:  Yes Pulse Check Posterior Tibialis and Dorsalis pulse intact bilaterally:  Yes Comments Nails long and thick       Assessment & Plan:   Jesus Curtis was seen today for medical management of chronic issues.  Diagnoses and all orders for this visit:  Hyperlipidemia associated with type 2 diabetes mellitus ([place]) -     CBC with Differential/Platelet -     CMP14+EGFR -     Lipid panel -     Bayer DCA Hb A1c Waived  Borderline diabetes mellitus  Essential hypertension  Other orders -     atorvastatin (LIPITOR) 80 MG tablet; TAKE 1 TABLET EVERY DAY AT 6PM -     lisinopril (PRINIVIL,ZESTRIL) 20 MG tablet; Take 1 tablet (20 mg total) by mouth daily. -     metFORMIN  (GLUCOPHAGE-XR) 500 MG 24 hr tablet; TAKE 1 TABLET BY MOUTH EVERY DAY WITH BREAKFAST -     metoprolol tartrate (LOPRESSOR) 100 MG tablet; Take 1 tablet (100 mg total) by mouth 2 (two) times daily.   I am having Jesus Curtis maintain his Multiple Vitamin (ONE-A-DAY MENS PO), acetaminophen, GINSENG PO, aspirin EC, blood glucose meter kit and supplies, glucose blood, atorvastatin, lisinopril, metFORMIN, and metoprolol tartrate.  Meds ordered this encounter  Medications  . atorvastatin (LIPITOR) 80 MG tablet    Sig: TAKE 1 TABLET EVERY DAY AT 6PM    Dispense:  90 tablet    Refill:  1  . lisinopril (PRINIVIL,ZESTRIL) 20 MG tablet    Sig: Take 1 tablet (20 mg total) by mouth daily.    Dispense:  90 tablet  Refill:  1  . metFORMIN (GLUCOPHAGE-XR) 500 MG 24 hr tablet    Sig: TAKE 1 TABLET BY MOUTH EVERY DAY WITH BREAKFAST    Dispense:  90 tablet    Refill:  1  . metoprolol tartrate (LOPRESSOR) 100 MG tablet    Sig: Take 1 tablet (100 mg total) by mouth 2 (two) times daily.    Dispense:  180 tablet    Refill:  1     Follow-up: Return in about 3 months (around 10/02/2018).  Claretta Fraise, M.D.

## 2018-07-03 LAB — CBC WITH DIFFERENTIAL/PLATELET
Basophils Absolute: 0 10*3/uL (ref 0.0–0.2)
Basos: 1 %
EOS (ABSOLUTE): 0.3 10*3/uL (ref 0.0–0.4)
Eos: 5 %
HEMOGLOBIN: 12.6 g/dL — AB (ref 13.0–17.7)
Hematocrit: 36.8 % — ABNORMAL LOW (ref 37.5–51.0)
Immature Grans (Abs): 0 10*3/uL (ref 0.0–0.1)
Immature Granulocytes: 0 %
LYMPHS ABS: 3.1 10*3/uL (ref 0.7–3.1)
Lymphs: 51 %
MCH: 31 pg (ref 26.6–33.0)
MCHC: 34.2 g/dL (ref 31.5–35.7)
MCV: 90 fL (ref 79–97)
Monocytes Absolute: 0.4 10*3/uL (ref 0.1–0.9)
Monocytes: 6 %
Neutrophils Absolute: 2.3 10*3/uL (ref 1.4–7.0)
Neutrophils: 37 %
Platelets: 200 10*3/uL (ref 150–450)
RBC: 4.07 x10E6/uL — ABNORMAL LOW (ref 4.14–5.80)
RDW: 13.3 % (ref 11.6–15.4)
WBC: 6.1 10*3/uL (ref 3.4–10.8)

## 2018-07-03 LAB — CMP14+EGFR
ALBUMIN: 4.4 g/dL (ref 3.7–4.7)
ALT: 21 IU/L (ref 0–44)
AST: 19 IU/L (ref 0–40)
Albumin/Globulin Ratio: 2.1 (ref 1.2–2.2)
Alkaline Phosphatase: 64 IU/L (ref 39–117)
BUN/Creatinine Ratio: 15 (ref 10–24)
BUN: 13 mg/dL (ref 8–27)
Bilirubin Total: 0.4 mg/dL (ref 0.0–1.2)
CO2: 21 mmol/L (ref 20–29)
Calcium: 9.6 mg/dL (ref 8.6–10.2)
Chloride: 107 mmol/L — ABNORMAL HIGH (ref 96–106)
Creatinine, Ser: 0.85 mg/dL (ref 0.76–1.27)
GFR calc Af Amer: 98 mL/min/{1.73_m2} (ref >59)
GFR calc non Af Amer: 85 mL/min/{1.73_m2} (ref >59)
Globulin, Total: 2.1 g/dL (ref 1.5–4.5)
Glucose: 91 mg/dL (ref 65–99)
POTASSIUM: 4.4 mmol/L (ref 3.5–5.2)
SODIUM: 140 mmol/L (ref 134–144)
Total Protein: 6.5 g/dL (ref 6.0–8.5)

## 2018-07-03 LAB — LIPID PANEL
Chol/HDL Ratio: 2.9 ratio (ref 0.0–5.0)
Cholesterol, Total: 98 mg/dL — ABNORMAL LOW (ref 100–199)
HDL: 34 mg/dL — ABNORMAL LOW (ref >39)
LDL Calculated: 46 mg/dL (ref 0–99)
Triglycerides: 91 mg/dL (ref 0–149)
VLDL Cholesterol Cal: 18 mg/dL (ref 5–40)

## 2018-07-03 NOTE — Progress Notes (Signed)
Hello Jesus Curtis,  Your lab result is normal.Some minor variations that are not significant are commonly marked abnormal, but do not represent any medical problem for you.  Best regards, Douglas Smolinsky, M.D.

## 2018-07-28 ENCOUNTER — Ambulatory Visit: Payer: Medicare Other | Admitting: Cardiology

## 2018-09-16 ENCOUNTER — Other Ambulatory Visit: Payer: Self-pay

## 2018-09-16 NOTE — Patient Outreach (Signed)
[facility]) Care Management  09/16/2018  Jesus Curtis 1941/03/31 [id number]   Medication Adherence call to Mr. Jesus Curtis HIPPA Compliant Voice message left with a call back number. Mr. Schwenke is showing past due on Lisinopril 10 mg,Metforin Er 500 mg and Atorvastatin 80 under [employer].   Lillia Abed CPhT Pharmacy Technician [facility] Management Direct Dial [phone]  Fax [phone] [email or web address]

## 2018-09-22 ENCOUNTER — Telehealth: Payer: Self-pay | Admitting: Family Medicine

## 2018-09-23 ENCOUNTER — Other Ambulatory Visit: Payer: Self-pay | Admitting: Family Medicine

## 2018-09-23 MED ORDER — METFORMIN HCL ER 500 MG PO TB24: ORAL_TABLET | ORAL | 0 refills | Status: DC

## 2018-09-23 NOTE — Telephone Encounter (Signed)
#  30 day sent to [facility] - pt wife aware

## 2018-10-01 ENCOUNTER — Other Ambulatory Visit: Payer: Self-pay

## 2018-10-04 ENCOUNTER — Ambulatory Visit: Payer: Medicare Other | Admitting: Family Medicine

## 2018-10-11 ENCOUNTER — Other Ambulatory Visit: Payer: Self-pay

## 2018-10-11 ENCOUNTER — Encounter: Payer: Self-pay | Admitting: Family Medicine

## 2018-10-11 ENCOUNTER — Ambulatory Visit (INDEPENDENT_AMBULATORY_CARE_PROVIDER_SITE_OTHER): Payer: Medicare Other | Admitting: Family Medicine

## 2018-10-11 VITALS — BP 151/76 | HR 61 | Temp 97.8°F | Ht 68.0 in | Wt 174.0 lb

## 2018-10-11 DIAGNOSIS — I10 Essential (primary) hypertension: Secondary | ICD-10-CM | POA: Diagnosis not present

## 2018-10-11 DIAGNOSIS — R7303 Prediabetes: Secondary | ICD-10-CM

## 2018-10-11 LAB — BAYER DCA HB A1C WAIVED: HB A1C (BAYER DCA - WAIVED): 6.3 % (ref <7.0)

## 2018-10-11 MED ORDER — LISINOPRIL 40 MG PO TABS: 40.0000 mg | ORAL_TABLET | Freq: Every day | ORAL | 1 refills | Status: DC

## 2018-10-11 NOTE — Patient Instructions (Signed)
Return in 3 months.

## 2018-10-11 NOTE — Progress Notes (Signed)
Subjective:  Patient ID: Jesus Curtis,  male    DOB: [birth year]  Age: 78 y.o.    CC: Medical Management of Chronic Issues   HPI Jesus Curtis presents for  follow-up of hypertension. Patient has no history of headache chest pain or shortness of breath or recent cough. Patient also denies symptoms of TIA such as numbness weakness lateralizing. Patient denies side effects from medication. States taking it regularly.  Patient also  in for follow-up of elevated cholesterol. Doing well without complaints on current medication. Denies side effects  including myalgia and arthralgia and nausea. Also in today for liver function testing. Currently no chest pain, shortness of breath or other cardiovascular related symptoms noted.  Follow-up of diabetes. Patient does check blood sugar at home occasionally. Readings run about the same as before (160.)Patient denies symptoms such as excessive hunger or urinary frequency, excessive hunger, nausea No significant hypoglycemic spells noted. Medications reviewed. Pt reports taking them regularly. Pt. denies complication/adverse reaction today.    History Jesus Curtis has a past medical history of Abnormal stress test, Aortic atherosclerosis ([place]), Chest pain, Elevated hemoglobin A1c, Elevated troponin, Hypertension, Nausea & vomiting, Snake bite, SVT (supraventricular tachycardia) ([place]), and Syncope.   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   His family history includes Heart attack in his brother, brother, father, and mother; Heart disease in his brother, father, mother, and paternal grandfather; Hypertension in his father, mother, and paternal grandfather; Stroke in his brother, maternal grandmother, and paternal grandmother.He reports that he has never smoked. He has never used smokeless tobacco. He  reports that he does not drink alcohol or use drugs.  Current Outpatient Medications on File Prior to Visit  Medication Sig Dispense Refill  . acetaminophen (TYLENOL) 500 MG tablet Take 1,000 mg by mouth every 6 (six) hours as needed for mild pain.    Marland Kitchen aspirin EC 81 MG tablet Take 81 mg by mouth daily.    Marland Kitchen atorvastatin (LIPITOR) 80 MG tablet TAKE 1 TABLET EVERY DAY AT 6PM 90 tablet 1  . blood glucose meter kit and supplies KIT Dispense based on patient and insurance preference. Use up to four times daily as directed. (FOR ICD-9 250.00, 250.01). 1 each 11  . GINSENG PO Take 2 tablets by mouth daily.    Marland Kitchen glucose blood (ONE TOUCH ULTRA TEST) test strip Use to check blood sugars up to four times a day 400 each 1  . metFORMIN (GLUCOPHAGE-XR) 500 MG 24 hr tablet TAKE 1 TABLET BY MOUTH EVERY DAY WITH BREAKFAST 30 tablet 0  . metoprolol tartrate (LOPRESSOR) 100 MG tablet Take 1 tablet (100 mg total) by mouth 2 (two) times daily. 180 tablet 1  . Multiple Vitamin (ONE-A-DAY MENS PO) Take 1 tablet by mouth daily.     No current facility-administered medications on file prior to visit.     ROS Review of Systems  Constitutional: Negative.   HENT: Negative.   Eyes: Negative for visual disturbance.  Respiratory: Negative for cough and shortness of breath.   Cardiovascular: Negative for chest pain and leg swelling.  Gastrointestinal: Negative for abdominal pain, diarrhea, nausea and vomiting.  Genitourinary: Negative for difficulty urinating.  Musculoskeletal: Negative for arthralgias and myalgias.  Skin: Negative for rash.  Neurological: Negative for headaches.  Psychiatric/Behavioral: Negative for sleep disturbance.    Objective:  BP (!) 151/76  Pulse 61   Temp 97.8 F (36.6 C) (Oral)   Ht '5\' 8"'  (1.727 m)   Wt 174 lb (78.9 kg)   BMI 26.46 kg/m   BP Readings from Last 3 Encounters:  10/11/18 (!) 151/76  07/02/18 133/72  03/30/18 (!) 164/86    Wt Readings from Last 3 Encounters:   10/11/18 174 lb (78.9 kg)  07/02/18 170 lb 8 oz (77.3 kg)  03/30/18 170 lb 9.6 oz (77.4 kg)     Physical Exam Constitutional:      General: He is not in acute distress.    Appearance: He is well-developed.  HENT:     Head: Normocephalic and atraumatic.     Right Ear: External ear normal.     Left Ear: External ear normal.     Nose: Nose normal.  Eyes:     Conjunctiva/sclera: Conjunctivae normal.     Pupils: Pupils are equal, round, and reactive to light.  Neck:     Musculoskeletal: Normal range of motion and neck supple.  Cardiovascular:     Rate and Rhythm: Normal rate and regular rhythm.     Heart sounds: Normal heart sounds. No murmur.  Pulmonary:     Effort: Pulmonary effort is normal. No respiratory distress.     Breath sounds: Normal breath sounds. No wheezing or rales.  Abdominal:     Palpations: Abdomen is soft.     Tenderness: There is no abdominal tenderness.  Musculoskeletal: Normal range of motion.  Skin:    General: Skin is warm and dry.  Neurological:     Mental Status: He is alert and oriented to person, place, and time.     Deep Tendon Reflexes: Reflexes are normal and symmetric.  Psychiatric:        Behavior: Behavior normal.        Thought Content: Thought content normal.        Judgment: Judgment normal.     Diabetic Foot Exam - Simple   No data filed        Assessment & Plan:   Jesus Curtis was seen today for medical management of chronic issues.  Diagnoses and all orders for this visit:  Essential hypertension -     CBC with Differential/Platelet -     CMP14+EGFR  Borderline diabetes mellitus -     CMP14+EGFR -     Bayer DCA Hb A1c Waived  Other orders -     lisinopril (ZESTRIL) 40 MG tablet; Take 1 tablet (40 mg total) by mouth daily.   I have changed Jesus Curtis's lisinopril. I am also having him maintain his Multiple Vitamin (ONE-A-DAY MENS PO), acetaminophen, GINSENG PO, aspirin EC, blood glucose meter kit and supplies, glucose  blood, atorvastatin, metoprolol tartrate, and metFORMIN.  Meds ordered this encounter  Medications  . lisinopril (ZESTRIL) 40 MG tablet    Sig: Take 1 tablet (40 mg total) by mouth daily.    Dispense:  90 tablet    Refill:  1     Follow-up: Return in about 3 months (around 01/11/2019).  Claretta Fraise, M.D.

## 2018-10-12 LAB — CMP14+EGFR
ALT: 21 IU/L (ref 0–44)
AST: 20 IU/L (ref 0–40)
Albumin/Globulin Ratio: 2 (ref 1.2–2.2)
Albumin: 4.3 g/dL (ref 3.7–4.7)
Alkaline Phosphatase: 67 IU/L (ref 39–117)
BUN/Creatinine Ratio: 14 (ref 10–24)
BUN: 11 mg/dL (ref 8–27)
Bilirubin Total: 0.4 mg/dL (ref 0.0–1.2)
CO2: 22 mmol/L (ref 20–29)
Calcium: 9.6 mg/dL (ref 8.6–10.2)
Chloride: 108 mmol/L — ABNORMAL HIGH (ref 96–106)
Creatinine, Ser: 0.76 mg/dL (ref 0.76–1.27)
GFR calc Af Amer: 102 mL/min/{1.73_m2} (ref >59)
GFR calc non Af Amer: 89 mL/min/{1.73_m2} (ref >59)
Globulin, Total: 2.1 g/dL (ref 1.5–4.5)
Glucose: 165 mg/dL — ABNORMAL HIGH (ref 65–99)
Potassium: 4.2 mmol/L (ref 3.5–5.2)
Sodium: 143 mmol/L (ref 134–144)
Total Protein: 6.4 g/dL (ref 6.0–8.5)

## 2018-10-12 LAB — CBC WITH DIFFERENTIAL/PLATELET
Basophils Absolute: 0 10*3/uL (ref 0.0–0.2)
Basos: 1 %
EOS (ABSOLUTE): 0.4 10*3/uL (ref 0.0–0.4)
Eos: 6 %
Hematocrit: 37.7 % (ref 37.5–51.0)
Hemoglobin: 12.5 g/dL — ABNORMAL LOW (ref 13.0–17.7)
Immature Grans (Abs): 0 10*3/uL (ref 0.0–0.1)
Immature Granulocytes: 0 %
Lymphocytes Absolute: 3 10*3/uL (ref 0.7–3.1)
Lymphs: 48 %
MCH: 30.1 pg (ref 26.6–33.0)
MCHC: 33.2 g/dL (ref 31.5–35.7)
MCV: 91 fL (ref 79–97)
Monocytes Absolute: 0.4 10*3/uL (ref 0.1–0.9)
Monocytes: 6 %
Neutrophils Absolute: 2.5 10*3/uL (ref 1.4–7.0)
Neutrophils: 39 %
Platelets: 218 10*3/uL (ref 150–450)
RBC: 4.15 x10E6/uL (ref 4.14–5.80)
RDW: 13.2 % (ref 11.6–15.4)
WBC: 6.3 10*3/uL (ref 3.4–10.8)

## 2018-10-15 ENCOUNTER — Other Ambulatory Visit: Payer: Self-pay | Admitting: Family Medicine

## 2018-11-16 ENCOUNTER — Ambulatory Visit: Payer: Medicare Other | Admitting: Cardiology

## 2018-11-16 ENCOUNTER — Encounter: Payer: Self-pay | Admitting: Cardiology

## 2018-11-16 ENCOUNTER — Other Ambulatory Visit: Payer: Self-pay

## 2018-11-16 VITALS — BP 146/78 | HR 50 | Temp 97.0°F | Ht 69.0 in | Wt 172.0 lb

## 2018-11-16 DIAGNOSIS — I2510 Atherosclerotic heart disease of native coronary artery without angina pectoris: Secondary | ICD-10-CM

## 2018-11-16 DIAGNOSIS — I10 Essential (primary) hypertension: Secondary | ICD-10-CM

## 2018-11-16 MED ORDER — AMLODIPINE BESYLATE 5 MG PO TABS: 5.0000 mg | ORAL_TABLET | Freq: Every day | ORAL | 3 refills | Status: DC

## 2018-11-16 NOTE — Progress Notes (Signed)
Clinical Summary Jesus Curtis is a 78 y.o.male seen for the following medical problems.  1. CAD - 11/2016 cath with severe multivessel disease, referred for CABG - 8/6/18CABGx4 (left internal mammary artery to left anterior descending, saphenous vein graft to diagonal, saphenous vein graft to circumflex marginal, saphenous vein graft to posterior descending Sky Primo of the distal circumflex) - 11/2016 echo LVEF 55-60%, grade II diastolic dysfunction  - no recent symptoms. Denies any SOB or DOe  compliant with meds   2. HTN - pcp increased lisinopril to 17m during last visit 09/2018 - checks bp at home, 140s/80s     Lipids 06/2018 TC 98 TG 91 HDL 34 LDL 46   Past Medical History:  Diagnosis Date  . Abnormal stress test   . [place]   . Chest pain   . Elevated hemoglobin A1c   . Elevated troponin   . Hypertension   . Nausea & vomiting   . Snake bite    AS A CHILD HAD PMH  . [place]) [place]   . Syncope      Allergies  Allergen Reactions  . Ativan [Lorazepam] Other (See Comments)    "goes wild"     Current Outpatient Medications  Medication Sig Dispense Refill  . acetaminophen (TYLENOL) 500 MG tablet Take 1,000 mg by mouth every 6 (six) hours as needed for mild pain.    .Marland Kitchenaspirin EC 81 MG tablet Take 81 mg by mouth daily.    .Marland Kitchenatorvastatin (LIPITOR) 80 MG tablet TAKE 1 TABLET EVERY DAY AT 6PM 90 tablet 1  . blood glucose meter kit and supplies KIT Dispense based on patient and insurance preference. Use up to four times daily as directed. (FOR ICD-9 250.00, 250.01). 1 each 11  . GINSENG PO Take 2 tablets by mouth daily.    .Marland Kitchenglucose blood (ONE TOUCH ULTRA TEST) test strip Use to check blood sugars up to four times a day 400 each 1  . lisinopril (ZESTRIL) 40 MG tablet Take 1 tablet (40 mg total) by mouth daily. 90 tablet 1  . metFORMIN (GLUCOPHAGE-XR) 500 MG 24 hr tablet TAKE 1 TABLET BY MOUTH  EVERY DAY WITH BREAKFAST 30 tablet 0  . metoprolol tartrate (LOPRESSOR) 100 MG tablet Take 1 tablet (100 mg total) by mouth 2 (two) times daily. 180 tablet 1  . Multiple Vitamin (ONE-A-DAY MENS PO) Take 1 tablet by mouth daily.     No current facility-administered medications for this visit.      Past Surgical History:  Procedure Laterality Date  . CORONARY ARTERY BYPASS GRAFT N/A 11/24/2016   Procedure: CORONARY ARTERY BYPASS GRAFTING (CABG), ON PUMP, TIMES FOUR, USING LEFT INTERNAL MAMMARY ARTERY AND ENDOSCOPICALLY HARVESTED RIGHT GREATER SAPHENOUS VEIN;  Surgeon: VIvin Poot MD;  [facility]  Service: Open Heart Surgery;  Laterality: N/A;  LIMA to LAD SVG to PDA SVG to OM1 SVG to Diag1  . LEFT HEART CATH AND CORONARY ANGIOGRAPHY N/A 11/21/2016   Procedure: LEFT HEART CATH AND CORONARY ANGIOGRAPHY;  Surgeon: KTroy Sine MD;  [facility];  Service: Cardiovascular;  Laterality: N/A;  . TEE WITHOUT CARDIOVERSION N/A 11/24/2016   Procedure: TRANSESOPHAGEAL ECHOCARDIOGRAM (TEE);  Surgeon: VPrescott Gum PCollier Salina MD;  [place] [facility]  Service: Open Heart Surgery;  Laterality: N/A;     Allergies  Allergen Reactions  . Ativan [Lorazepam] Other (See Comments)    "goes wild"      Family History  Problem Relation Age of Onset  . Heart attack Mother   . Heart disease Mother   . Hypertension Mother   . Heart attack Father   . Heart disease Father   . Hypertension Father   . Heart attack Brother   . Heart disease Brother   . Stroke Brother   . Stroke Maternal Grandmother   . Stroke Paternal Grandmother   . Heart disease Paternal Grandfather   . Hypertension Paternal Grandfather   . Heart attack Brother      Social History Mr. Oberholzer reports that he has never smoked. He has never used smokeless tobacco. Mr. Carrick reports no history of alcohol use.   Review of Systems CONSTITUTIONAL: No weight loss, fever, chills, weakness or fatigue.  HEENT: Eyes:  No visual loss, blurred vision, double vision or yellow sclerae.No hearing loss, sneezing, congestion, runny nose or sore throat.  SKIN: No rash or itching.  CARDIOVASCULAR: per hpi RESPIRATORY: No shortness of breath, cough or sputum.  GASTROINTESTINAL: No anorexia, nausea, vomiting or diarrhea. No abdominal pain or blood.  GENITOURINARY: No burning on urination, no polyuria NEUROLOGICAL: No headache, dizziness, syncope, paralysis, ataxia, numbness or tingling in the extremities. No change in bowel or bladder control.  MUSCULOSKELETAL: No muscle, back pain, joint pain or stiffness.  LYMPHATICS: No enlarged nodes. No history of splenectomy.  PSYCHIATRIC: No history of depression or anxiety.  ENDOCRINOLOGIC: No reports of sweating, cold or heat intolerance. No polyuria or polydipsia.  Marland Kitchen   Physical Examination Today's Vitals   11/16/18 1255  BP: (!) 146/78  Pulse: (!) 50  Temp: (!) 97 F (36.1 C)  Weight: 172 lb (78 kg)  Height: '5\' 9"'  (1.753 m)   Body mass index is 25.4 kg/m.  Gen: resting comfortably, no acute distress HEENT: no scleral icterus, pupils equal round and reactive, no palptable cervical adenopathy,  CV: RRR, no m/r/g, no jvd Resp: Clear to auscultation bilaterally GI: abdomen is soft, non-tender, non-distended, normal bowel sounds, no hepatosplenomegaly MSK: extremities are warm, no edema.  Skin: warm, no rash Neuro:  no focal deficits Psych: appropriate affect   Diagnostic Studies 11/2016 cath   LM lesion, 30 %stenosed.  Ost LAD to Prox LAD lesion, 30 %stenosed.  Mid LAD lesion, 90 %stenosed.  1st Mrg lesion, 50 %stenosed.  Ost Cx to Prox Cx lesion, 70 %stenosed.  Prox Cx to Dist Cx lesion, 70 %stenosed.  Ost RCA to Prox RCA lesion, 80 %stenosed.  Mid RCA lesion, 100 %stenosed.  LV end diastolic pressure is normal.   11/2016 echo   - Left ventricle: The cavity size was normal. Systolic function was normal. The estimated ejection  fraction was in the range of 55% to 60%. Wall motion was normal; there were no regional wall motion abnormalities. Features are consistent with a pseudonormal left ventricular filling pattern, with concomitant abnormal relaxation and increased filling pressure (grade 2 diastolic dysfunction). Doppler parameters are consistent with elevated mean left atrial filling pressure. - Left atrium: The atrium was mildly dilated. - Pericardium, extracardiac: A trivial pericardial effusion was identified.   11/2016 carotid US Findings suggest 1-39% internal carotid artery stenosis bilaterally. Vertebral arteries are patent with antegrade flow.    Assessment and Plan  1. CAD - no recent symptoms, has done well 2 years out from CABG - contiue current meds  2. HTN -above goal of <130/80, we will start norvac 42m daily.  3. Sinus bradycardia - EKG today shows sinus brady at 50. Asymptomatic, conitnue current meds.  If an issue in the future would downtitrate his lopressor.      F/u 1 year      Arnoldo Lenis, M.D.

## 2018-11-16 NOTE — Patient Instructions (Signed)
Medication Instructions:  Your physician recommends that you continue on your current medications as directed. Please refer to the Current Medication list given to you today. Start Norvasc 5 mg Daily  If you need a refill on your cardiac medications before your next appointment, please call your pharmacy.   Lab work: NONE  If you have labs (blood work) drawn today and your tests are completely normal, you will receive your results only by: . MyChart Message (if you have MyChart) OR . A paper copy in the mail If you have any lab test that is abnormal or we need to change your treatment, we will call you to review the results.  Testing/Procedures: NONE   Follow-Up: At [facility], you and your health needs are our priority.  As part of our continuing mission to provide you with exceptional heart care, we have created designated Provider Care Teams.  These Care Teams include your primary Cardiologist (physician) and Advanced Practice Providers (APPs -  Physician Assistants and Nurse Practitioners) who all work together to provide you with the care you need, when you need it. You will need a follow up appointment in 1 years.  Please call our office 2 months in advance to schedule this appointment.  You may see Branch, Jonathan, MD or one of the following Advanced Practice Providers on your designated Care Team:   Brittany Strader, PA-C ([place] Office) . Michele Lenze, PA-C ([place] Office)  Any Other Special Instructions Will Be Listed Below (If Applicable). Thank you for choosing [facility]!     

## 2019-01-18 ENCOUNTER — Ambulatory Visit (INDEPENDENT_AMBULATORY_CARE_PROVIDER_SITE_OTHER): Payer: Medicare Other | Admitting: Family Medicine

## 2019-01-18 ENCOUNTER — Encounter: Payer: Self-pay | Admitting: Family Medicine

## 2019-01-18 VITALS — BP 140/71 | HR 69 | Temp 99.5°F | Ht 69.0 in | Wt 174.0 lb

## 2019-01-18 DIAGNOSIS — E1169 Type 2 diabetes mellitus with other specified complication: Secondary | ICD-10-CM

## 2019-01-18 DIAGNOSIS — R7303 Prediabetes: Secondary | ICD-10-CM | POA: Diagnosis not present

## 2019-01-18 DIAGNOSIS — E785 Hyperlipidemia, unspecified: Secondary | ICD-10-CM

## 2019-01-18 DIAGNOSIS — E119 Type 2 diabetes mellitus without complications: Secondary | ICD-10-CM

## 2019-01-18 DIAGNOSIS — I10 Essential (primary) hypertension: Secondary | ICD-10-CM | POA: Diagnosis not present

## 2019-01-18 DIAGNOSIS — Z125 Encounter for screening for malignant neoplasm of prostate: Secondary | ICD-10-CM

## 2019-01-18 LAB — BAYER DCA HB A1C WAIVED: HB A1C (BAYER DCA - WAIVED): 6.8 % (ref <7.0)

## 2019-01-18 MED ORDER — AMLODIPINE BESYLATE 10 MG PO TABS: 10.0000 mg | ORAL_TABLET | Freq: Every day | ORAL | 3 refills | Status: DC

## 2019-01-18 NOTE — Progress Notes (Signed)
Subjective:  Patient ID: Jesus Curtis,  male    DOB: 09-28-1940  Age: 78 y.o.    CC: Medical Management of Chronic Issues   HPI Desmond Szabo presents for  follow-up of hypertension. Patient has no history of headache chest pain or shortness of breath or recent cough. Patient also denies symptoms of TIA such as numbness weakness lateralizing. Patient denies side effects from medication. States taking it regularly.  Patient also  in for follow-up of elevated cholesterol. Doing well without complaints on current medication. Denies side effects  including myalgia and arthralgia and nausea. Also in today for liver function testing. Currently no chest pain, shortness of breath or other cardiovascular related symptoms noted.  Follow-up of diabetes. Patient does not check blood sugar at home.Unwilling to start. Patient denies symptoms such as excessive hunger or urinary frequency, excessive hunger, nausea No significant hypoglycemic spells noted. Medications reviewed. Pt reports taking them regularly. Pt. denies complication/adverse reaction today.    History Mivaan has a past medical history of Abnormal stress test, Aortic atherosclerosis ([place]), Chest pain, Elevated hemoglobin A1c, Elevated troponin, Hypertension, Nausea & vomiting, Snake bite, SVT (supraventricular tachycardia) ([place]), and Syncope.   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   His family history includes Heart attack in his brother, brother, father, and mother; Heart disease in his brother, father, mother, and paternal grandfather; Hypertension in his father, mother, and paternal grandfather; Stroke in his brother, maternal grandmother, and paternal grandmother.He reports that he has never smoked. He has never used smokeless tobacco. He reports that he does not drink  alcohol or use drugs.  Current Outpatient Medications on File Prior to Visit  Medication Sig Dispense Refill  . acetaminophen (TYLENOL) 500 MG tablet Take 1,000 mg by mouth every 6 (six) hours as needed for mild pain.    Marland Kitchen aspirin EC 81 MG tablet Take 81 mg by mouth daily.    Marland Kitchen atorvastatin (LIPITOR) 80 MG tablet TAKE 1 TABLET EVERY DAY AT 6PM 90 tablet 1  . blood glucose meter kit and supplies KIT Dispense based on patient and insurance preference. Use up to four times daily as directed. (FOR ICD-9 250.00, 250.01). 1 each 11  . GINSENG PO Take 2 tablets by mouth daily.    Marland Kitchen glucose blood (ONE TOUCH ULTRA TEST) test strip Use to check blood sugars up to four times a day 400 each 1  . lisinopril (ZESTRIL) 40 MG tablet Take 1 tablet (40 mg total) by mouth daily. 90 tablet 1  . metFORMIN (GLUCOPHAGE-XR) 500 MG 24 hr tablet TAKE 1 TABLET BY MOUTH EVERY DAY WITH BREAKFAST 30 tablet 0  . metoprolol tartrate (LOPRESSOR) 100 MG tablet Take 1 tablet (100 mg total) by mouth 2 (two) times daily. 180 tablet 1  . Multiple Vitamin (ONE-A-DAY MENS PO) Take 1 tablet by mouth daily.     No current facility-administered medications on file prior to visit.     ROS Review of Systems  Constitutional: Negative.   HENT: Negative.   Eyes: Negative for visual disturbance.  Respiratory: Negative for cough and shortness of breath.   Cardiovascular: Negative for chest pain and leg swelling.  Gastrointestinal: Negative for abdominal pain, diarrhea, nausea and vomiting.  Genitourinary: Negative for difficulty urinating.  Musculoskeletal: Negative for arthralgias and myalgias.  Skin: Negative for rash.  Neurological: Negative for headaches.  Psychiatric/Behavioral:  Negative for sleep disturbance.    Objective:  BP 140/71   Pulse 69   Temp 99.5 F (37.5 C) (Oral)   Ht _0  (1.753 m)   Wt 174 lb (78.9 kg)   SpO2 96%   BMI 25.70 kg/m   BP Readings from Last 3 Encounters:  01/18/19 140/71  11/16/18 (!)  146/78  10/11/18 (!) 151/76    Wt Readings from Last 3 Encounters:  01/18/19 174 lb (78.9 kg)  11/16/18 172 lb (78 kg)  10/11/18 174 lb (78.9 kg)     Physical Exam Constitutional:      General: He is not in acute distress.    Appearance: He is well-developed.  HENT:     Head: Normocephalic and atraumatic.     Right Ear: External ear normal.     Left Ear: External ear normal.     Nose: Nose normal.  Eyes:     Conjunctiva/sclera: Conjunctivae normal.     Pupils: Pupils are equal, round, and reactive to light.  Neck:     Musculoskeletal: Normal range of motion and neck supple.  Cardiovascular:     Rate and Rhythm: Normal rate and regular rhythm.     Heart sounds: Normal heart sounds. No murmur.  Pulmonary:     Effort: Pulmonary effort is normal. No respiratory distress.     Breath sounds: Normal breath sounds. No wheezing or rales.  Abdominal:     Palpations: Abdomen is soft.     Tenderness: There is no abdominal tenderness.  Musculoskeletal: Normal range of motion.  Skin:    General: Skin is warm and dry.  Neurological:     Mental Status: He is alert and oriented to person, place, and time.     Deep Tendon Reflexes: Reflexes are normal and symmetric.  Psychiatric:        Behavior: Behavior normal.        Thought Content: Thought content normal.        Judgment: Judgment normal.     Diabetic Foot Exam - Simple   No data filed        Assessment & Plan:   Evon was seen today for medical management of chronic issues.  Diagnoses and all orders for this visit:  Essential hypertension -     CMP14+EGFR  Type 2 diabetes mellitus without complication, without long-term current use of insulin ([facility]) -     Bayer DCA Hb A1c Waived -     Microalbumin / creatinine urine ratio  Hyperlipidemia associated with type 2 diabetes mellitus ([place]) -     Lipid panel  Screening for prostate cancer -     PSA Total (Reflex To Free)  Other orders -     amLODipine (NORVASC) 10  MG tablet; Take 1 tablet (10 mg total) by mouth daily.   I have changed Carlyn A. Degollado's amLODipine. I am also having him maintain his Multiple Vitamin (ONE-A-DAY MENS PO), acetaminophen, GINSENG PO, aspirin EC, blood glucose meter kit and supplies, glucose blood, atorvastatin, metoprolol tartrate, lisinopril, and metFORMIN.  Meds ordered this encounter  Medications  . amLODipine (NORVASC) 10 MG tablet    Sig: Take 1 tablet (10 mg total) by mouth daily.    Dispense:  90 tablet    Refill:  3     Follow-up: Return in about 3 months (around 04/19/2019) for diabetes.  Claretta Fraise, M.D.

## 2019-01-19 LAB — PSA TOTAL (REFLEX TO FREE): Prostate Specific Ag, Serum: 3.5 ng/mL (ref 0.0–4.0)

## 2019-01-19 LAB — LIPID PANEL
Chol/HDL Ratio: 2.9 ratio (ref 0.0–5.0)
Cholesterol, Total: 105 mg/dL (ref 100–199)
HDL: 36 mg/dL — ABNORMAL LOW (ref >39)
LDL Chol Calc (NIH): 46 mg/dL (ref 0–99)
Triglycerides: 130 mg/dL (ref 0–149)
VLDL Cholesterol Cal: 23 mg/dL (ref 5–40)

## 2019-01-19 LAB — CMP14+EGFR
ALT: 21 IU/L (ref 0–44)
AST: 17 IU/L (ref 0–40)
Albumin/Globulin Ratio: 2.1 (ref 1.2–2.2)
Albumin: 4.5 g/dL (ref 3.7–4.7)
Alkaline Phosphatase: 71 IU/L (ref 39–117)
BUN/Creatinine Ratio: 10 (ref 10–24)
BUN: 9 mg/dL (ref 8–27)
Bilirubin Total: 0.4 mg/dL (ref 0.0–1.2)
CO2: 23 mmol/L (ref 20–29)
Calcium: 9.5 mg/dL (ref 8.6–10.2)
Chloride: 103 mmol/L (ref 96–106)
Creatinine, Ser: 0.89 mg/dL (ref 0.76–1.27)
GFR calc Af Amer: 96 mL/min/{1.73_m2} (ref >59)
GFR calc non Af Amer: 83 mL/min/{1.73_m2} (ref >59)
Globulin, Total: 2.1 g/dL (ref 1.5–4.5)
Glucose: 154 mg/dL — ABNORMAL HIGH (ref 65–99)
Potassium: 4.4 mmol/L (ref 3.5–5.2)
Sodium: 141 mmol/L (ref 134–144)
Total Protein: 6.6 g/dL (ref 6.0–8.5)

## 2019-01-20 NOTE — Progress Notes (Signed)
Hello Avondre,  Your lab result is normal and/or stable.Some minor variations that are not significant are commonly marked abnormal, but do not represent any medical problem for you.  Best regards, Zlata Alcaide, M.D.

## 2019-02-14 ENCOUNTER — Other Ambulatory Visit: Payer: Self-pay | Admitting: Family Medicine

## 2019-04-26 ENCOUNTER — Ambulatory Visit (INDEPENDENT_AMBULATORY_CARE_PROVIDER_SITE_OTHER): Payer: Medicare Other | Admitting: Family Medicine

## 2019-04-26 ENCOUNTER — Encounter: Payer: Self-pay | Admitting: Family Medicine

## 2019-04-26 ENCOUNTER — Other Ambulatory Visit: Payer: Self-pay

## 2019-04-26 ENCOUNTER — Other Ambulatory Visit: Payer: Self-pay | Admitting: *Deleted

## 2019-04-26 VITALS — BP 141/72 | HR 67 | Temp 98.6°F | Ht 69.0 in | Wt 176.2 lb

## 2019-04-26 DIAGNOSIS — I2510 Atherosclerotic heart disease of native coronary artery without angina pectoris: Secondary | ICD-10-CM | POA: Diagnosis not present

## 2019-04-26 DIAGNOSIS — E119 Type 2 diabetes mellitus without complications: Secondary | ICD-10-CM | POA: Diagnosis not present

## 2019-04-26 DIAGNOSIS — I10 Essential (primary) hypertension: Secondary | ICD-10-CM | POA: Diagnosis not present

## 2019-04-26 LAB — BAYER DCA HB A1C WAIVED: HB A1C (BAYER DCA - WAIVED): 6.6 % (ref <7.0)

## 2019-04-26 MED ORDER — LISINOPRIL 40 MG PO TABS: 40.0000 mg | ORAL_TABLET | Freq: Every day | ORAL | 2 refills | Status: DC

## 2019-04-26 NOTE — Progress Notes (Signed)
Subjective:  Patient ID: Jesus Curtis,  male    DOB: May 18, 1940  Age: 79 y.o.    CC: Medical Management of Chronic Issues and Diabetes   HPI Dilan Novosad presents for  follow-up of hypertension. Patient has no history of headache chest pain or shortness of breath or recent cough. Patient also denies symptoms of TIA such as numbness weakness lateralizing. Patient denies side effects from medication. States taking it regularly.  Patient also  in for follow-up of elevated cholesterol. Doing well without complaints on current medication. Denies side effects  including myalgia and arthralgia and nausea. Also in today for liver function testing. Currently no chest pain, shortness of breath or other cardiovascular related symptoms noted.  Follow-up of diabetes. Patient denies symptoms such as excessive hunger or urinary frequency, excessive hunger, nausea No significant hypoglycemic spells noted. Medications reviewed. Pt reports taking them regularly. Pt. denies complication/adverse reaction today.    History Ahmed has a past medical history of Abnormal stress test, Aortic atherosclerosis ([place]), Chest pain, Elevated hemoglobin A1c, Elevated troponin, Hypertension, Nausea & vomiting, Snake bite, SVT (supraventricular tachycardia) ([place]), and Syncope.   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   His family history includes Heart attack in his brother, brother, father, and mother; Heart disease in his brother, father, mother, and paternal grandfather; Hypertension in his father, mother, and paternal grandfather; Stroke in his brother, maternal grandmother, and paternal grandmother.He reports that he has never smoked. He has never used smokeless tobacco. He reports that he does not drink alcohol or use drugs.  Current Outpatient  Medications on File Prior to Visit  Medication Sig Dispense Refill  . acetaminophen (TYLENOL) 500 MG tablet Take 1,000 mg by mouth every 6 (six) hours as needed for mild pain.    Marland Kitchen aspirin EC 81 MG tablet Take 81 mg by mouth daily.    . blood glucose meter kit and supplies KIT Dispense based on patient and insurance preference. Use up to four times daily as directed. (FOR ICD-9 250.00, 250.01). 1 each 11  . GINSENG PO Take 2 tablets by mouth daily.    Marland Kitchen glucose blood (ONE TOUCH ULTRA TEST) test strip Use to check blood sugars up to four times a day 400 each 1  . Multiple Vitamin (ONE-A-DAY MENS PO) Take 1 tablet by mouth daily.     No current facility-administered medications on file prior to visit.    ROS Review of Systems  Constitutional: Negative.   HENT: Negative.   Eyes: Negative for visual disturbance.  Respiratory: Negative for cough and shortness of breath.   Cardiovascular: Negative for chest pain and leg swelling.  Gastrointestinal: Negative for abdominal pain, diarrhea, nausea and vomiting.  Genitourinary: Negative for difficulty urinating.  Musculoskeletal: Negative for arthralgias and myalgias.  Skin: Negative for rash.  Neurological: Negative for headaches.  Psychiatric/Behavioral: Negative for sleep disturbance.    Objective:  BP (!) 141/72   Pulse 67   Temp 98.6 F (37 C) (Temporal)   Ht '5\' 9"'  (1.753 m)   Wt 176 lb 3.2 oz (79.9 kg)   SpO2 97%   BMI 26.02 kg/m   BP Readings from Last 3 Encounters:  04/26/19 (!) 141/72  01/18/19 140/71  11/16/18 (!) 146/78    Wt Readings from Last 3 Encounters:  04/26/19 176 lb 3.2 oz (79.9 kg)  01/18/19 174 lb (  78.9 kg)  11/16/18 172 lb (78 kg)     Physical Exam Vitals reviewed.  Constitutional:      Appearance: He is well-developed.  HENT:     Head: Normocephalic and atraumatic.     Right Ear: External ear normal.     Left Ear: External ear normal.     Mouth/Throat:     Pharynx: No oropharyngeal exudate or  posterior oropharyngeal erythema.  Eyes:     Pupils: Pupils are equal, round, and reactive to light.  Cardiovascular:     Rate and Rhythm: Normal rate and regular rhythm.     Heart sounds: No murmur.  Pulmonary:     Effort: No respiratory distress.     Breath sounds: Normal breath sounds.  Musculoskeletal:     Cervical back: Normal range of motion and neck supple.  Neurological:     Mental Status: He is alert and oriented to person, place, and time.     Diabetic Foot Exam - Simple   No data filed        Assessment & Plan:   Jillian was seen today for medical management of chronic issues and diabetes.  Diagnoses and all orders for this visit:  Essential hypertension -     Discontinue: lisinopril (ZESTRIL) 40 MG tablet; Take 1 tablet (40 mg total) by mouth daily. -     CMP -     CBC -     Lipid -     metoprolol tartrate (LOPRESSOR) 100 MG tablet; Take 1 tablet (100 mg total) by mouth 2 (two) times daily. -     lisinopril (ZESTRIL) 40 MG tablet; Take 1 tablet (40 mg total) by mouth daily. -     amLODipine (NORVASC) 10 MG tablet; Take 1 tablet (10 mg total) by mouth daily.  Type 2 diabetes mellitus without complication, without long-term current use of insulin (HCC) -     hgba1c -     CMP -     CBC -     Lipid -     metFORMIN (GLUCOPHAGE-XR) 500 MG 24 hr tablet; TAKE 1 TABLET BY MOUTH  DAILY WITH BREAKFAST -     atorvastatin (LIPITOR) 80 MG tablet; Take 1 tablet (80 mg total) by mouth daily.  ASCVD (arteriosclerotic cardiovascular disease) -     metoprolol tartrate (LOPRESSOR) 100 MG tablet; Take 1 tablet (100 mg total) by mouth 2 (two) times daily. -     atorvastatin (LIPITOR) 80 MG tablet; Take 1 tablet (80 mg total) by mouth daily.   I have changed Nguyen A. Villanueva's metoprolol tartrate and atorvastatin. I am also having him maintain his Multiple Vitamin (ONE-A-DAY MENS PO), acetaminophen, GINSENG PO, aspirin EC, blood glucose meter kit and supplies, glucose blood,  metFORMIN, lisinopril, and amLODipine.  Meds ordered this encounter  Medications  . DISCONTD: lisinopril (ZESTRIL) 40 MG tablet    Sig: Take 1 tablet (40 mg total) by mouth daily.    Dispense:  90 tablet    Refill:  2  . metoprolol tartrate (LOPRESSOR) 100 MG tablet    Sig: Take 1 tablet (100 mg total) by mouth 2 (two) times daily.    Dispense:  180 tablet    Refill:  3    Requesting 1 year supply  . metFORMIN (GLUCOPHAGE-XR) 500 MG 24 hr tablet    Sig: TAKE 1 TABLET BY MOUTH  DAILY WITH BREAKFAST    Dispense:  90 tablet    Refill:  3  Requesting 1 year supply  . lisinopril (ZESTRIL) 40 MG tablet    Sig: Take 1 tablet (40 mg total) by mouth daily.    Dispense:  90 tablet    Refill:  2  . amLODipine (NORVASC) 10 MG tablet    Sig: Take 1 tablet (10 mg total) by mouth daily.    Dispense:  90 tablet    Refill:  3  . atorvastatin (LIPITOR) 80 MG tablet    Sig: Take 1 tablet (80 mg total) by mouth daily.    Dispense:  90 tablet    Refill:  3    Requesting 1 year supply   Follow low carb diet, exercise regularly. Monitor glucose BID.  Follow-up: Return in about 3 months (around 07/25/2019).  Claretta Fraise, M.D.

## 2019-04-27 LAB — LIPID PANEL
Chol/HDL Ratio: 2.4 ratio (ref 0.0–5.0)
Cholesterol, Total: 81 mg/dL — ABNORMAL LOW (ref 100–199)
HDL: 34 mg/dL — ABNORMAL LOW (ref >39)
LDL Chol Calc (NIH): 31 mg/dL (ref 0–99)
Triglycerides: 78 mg/dL (ref 0–149)
VLDL Cholesterol Cal: 16 mg/dL (ref 5–40)

## 2019-04-27 LAB — CBC WITH DIFFERENTIAL/PLATELET
Basophils Absolute: 0 10*3/uL (ref 0.0–0.2)
Basos: 1 %
EOS (ABSOLUTE): 0.2 10*3/uL (ref 0.0–0.4)
Eos: 4 %
Hematocrit: 37 % — ABNORMAL LOW (ref 37.5–51.0)
Hemoglobin: 12.7 g/dL — ABNORMAL LOW (ref 13.0–17.7)
Immature Grans (Abs): 0 10*3/uL (ref 0.0–0.1)
Immature Granulocytes: 0 %
Lymphocytes Absolute: 2.8 10*3/uL (ref 0.7–3.1)
Lymphs: 46 %
MCH: 31.1 pg (ref 26.6–33.0)
MCHC: 34.3 g/dL (ref 31.5–35.7)
MCV: 91 fL (ref 79–97)
Monocytes Absolute: 0.4 10*3/uL (ref 0.1–0.9)
Monocytes: 7 %
Neutrophils Absolute: 2.5 10*3/uL (ref 1.4–7.0)
Neutrophils: 42 %
Platelets: 234 10*3/uL (ref 150–450)
RBC: 4.08 x10E6/uL — ABNORMAL LOW (ref 4.14–5.80)
RDW: 12.9 % (ref 11.6–15.4)
WBC: 6 10*3/uL (ref 3.4–10.8)

## 2019-04-27 LAB — CMP14+EGFR
ALT: 23 IU/L (ref 0–44)
AST: 23 IU/L (ref 0–40)
Albumin/Globulin Ratio: 2.3 — ABNORMAL HIGH (ref 1.2–2.2)
Albumin: 4.4 g/dL (ref 3.7–4.7)
Alkaline Phosphatase: 75 IU/L (ref 39–117)
BUN/Creatinine Ratio: 10 (ref 10–24)
BUN: 9 mg/dL (ref 8–27)
Bilirubin Total: 0.3 mg/dL (ref 0.0–1.2)
CO2: 22 mmol/L (ref 20–29)
Calcium: 9.6 mg/dL (ref 8.6–10.2)
Chloride: 105 mmol/L (ref 96–106)
Creatinine, Ser: 0.86 mg/dL (ref 0.76–1.27)
GFR calc Af Amer: 97 mL/min/{1.73_m2} (ref >59)
GFR calc non Af Amer: 84 mL/min/{1.73_m2} (ref >59)
Globulin, Total: 1.9 g/dL (ref 1.5–4.5)
Glucose: 145 mg/dL — ABNORMAL HIGH (ref 65–99)
Potassium: 4.4 mmol/L (ref 3.5–5.2)
Sodium: 140 mmol/L (ref 134–144)
Total Protein: 6.3 g/dL (ref 6.0–8.5)

## 2019-04-27 NOTE — Progress Notes (Signed)
Hello Adebayo,  Your lab result is normal and/or stable.Some minor variations that are not significant are commonly marked abnormal, but do not represent any medical problem for you.  Best regards, Khaleem Burchill, M.D.

## 2019-05-03 ENCOUNTER — Telehealth: Payer: Self-pay | Admitting: Family Medicine

## 2019-05-03 NOTE — Telephone Encounter (Signed)
Yes. Please take both. They wirk well together in the body to bring down blood pressure

## 2019-05-04 ENCOUNTER — Encounter: Payer: Self-pay | Admitting: Family Medicine

## 2019-05-04 ENCOUNTER — Telehealth: Payer: Self-pay | Admitting: Family Medicine

## 2019-05-04 MED ORDER — METOPROLOL TARTRATE 100 MG PO TABS: 100.0000 mg | ORAL_TABLET | Freq: Two times a day (BID) | ORAL | 3 refills | Status: DC

## 2019-05-04 MED ORDER — METFORMIN HCL ER 500 MG PO TB24: ORAL_TABLET | ORAL | 3 refills | Status: DC

## 2019-05-04 MED ORDER — ATORVASTATIN CALCIUM 80 MG PO TABS: 80.0000 mg | ORAL_TABLET | Freq: Every day | ORAL | 3 refills | Status: DC

## 2019-05-04 MED ORDER — AMLODIPINE BESYLATE 10 MG PO TABS: 10.0000 mg | ORAL_TABLET | Freq: Every day | ORAL | 3 refills | Status: DC

## 2019-05-04 MED ORDER — LISINOPRIL 40 MG PO TABS: 40.0000 mg | ORAL_TABLET | Freq: Every day | ORAL | 2 refills | Status: DC

## 2019-05-04 NOTE — Telephone Encounter (Signed)
Wife Mary aware

## 2019-05-04 NOTE — Telephone Encounter (Signed)
Yes

## 2019-05-04 NOTE — Telephone Encounter (Signed)
Please advise him to take both medications. Thanks, WS

## 2019-05-04 NOTE — Telephone Encounter (Signed)
Wife Corrie Dandy) aware that patient is to take both lisinopril and amlodipine for elevated BP per previous telephone note.

## 2019-05-04 NOTE — Telephone Encounter (Signed)
Pt called and detailed message left on VM

## 2019-06-17 DIAGNOSIS — H524 Presbyopia: Secondary | ICD-10-CM | POA: Diagnosis not present

## 2019-06-17 DIAGNOSIS — H2513 Age-related nuclear cataract, bilateral: Secondary | ICD-10-CM | POA: Diagnosis not present

## 2019-06-17 DIAGNOSIS — Z7984 Long term (current) use of oral hypoglycemic drugs: Secondary | ICD-10-CM | POA: Diagnosis not present

## 2019-06-17 DIAGNOSIS — E119 Type 2 diabetes mellitus without complications: Secondary | ICD-10-CM | POA: Diagnosis not present

## 2019-06-17 DIAGNOSIS — H5203 Hypermetropia, bilateral: Secondary | ICD-10-CM | POA: Diagnosis not present

## 2019-06-17 LAB — HM DIABETES EYE EXAM

## 2019-06-20 ENCOUNTER — Telehealth: Payer: Self-pay | Admitting: Family Medicine

## 2019-06-20 NOTE — Chronic Care Management (AMB) (Signed)
  Chronic Care Management   Outreach Note  06/20/2019 Name: Jesus Curtis MRN: [id number] DOB: 1940-08-24  Jesus Curtis is a 78 y.o. year old male who is a primary care patient of Stacks, Broadus Salahuddin, MD. I reached out to Jesus Curtis by phone today in response to a referral sent by Jesus Curtis [facility] health plan.     An unsuccessful telephone outreach was attempted today. The patient was referred to the case management team for assistance with care management and care coordination.   Follow Up Plan: A HIPPA compliant phone message was left for the patient providing contact information and requesting a return call.  The care management team will reach out to the patient again over the next 7 days.  If patient returns call to provider office, please advise to call Embedded Care Management Care Guide Penne Lash  at [phone].  Penne Lash, RMA Care Guide, Embedded Care Coordination [facility]  [place], [place] [address] Direct Dial: [phone] [email or web address] Website: [email or web address]

## 2019-06-28 NOTE — Chronic Care Management (AMB) (Signed)
  Chronic Care Management   Note  06/28/2019 Name: Jesus Curtis MRN: [id number] DOB: 04/02/1941  [name] is a 79 y.o. year old male who is a primary care patient of Stacks, Cletus Gash, MD. I reached out to [name] by phone today in response to a referral sent by [name] health plan.     [name] spouse [name] was given information about Chronic Care Management services today including:  1. CCM service includes personalized support from designated clinical staff supervised by his physician, including individualized plan of care and coordination with other care providers 2. 24/7 contact phone numbers for assistance for urgent and routine care needs. 3. Service will only be billed when office clinical staff spend 20 minutes or more in a month to coordinate care. 4. Only one practitioner may furnish and bill the service in a calendar month. 5. The patient may stop CCM services at any time (effective at the end of the month) by phone call to the office staff. 6. The patient will be responsible for cost sharing (co-pay) of up to 20% of the service fee (after annual deductible is met).  Patient's spouse did not agree to enrollment in care management services and does not wish to consider at this time.  Follow up plan: The patient has been provided with contact information for the care management team and has been advised to call with any health related questions or concerns.   Noreene Larsson, [place], [place], [place] [address] Direct Dial: [phone] [email or web address] Website: [email or web address]

## 2019-07-25 ENCOUNTER — Ambulatory Visit: Payer: Medicare Other | Admitting: Family Medicine

## 2019-07-26 IMAGING — DX DG ABDOMEN 1V
2 series · 2 of 2 positions shown · non-contrast
Comparison: None.

CLINICAL DATA: Nausea only after eating and vomiting thick
mucus-like material. Syncope yesterday for 15 minutes. No reported
abd hx.

EXAM:
ABDOMEN - 1 VIEW

[abdomen kub (1 of 2)]
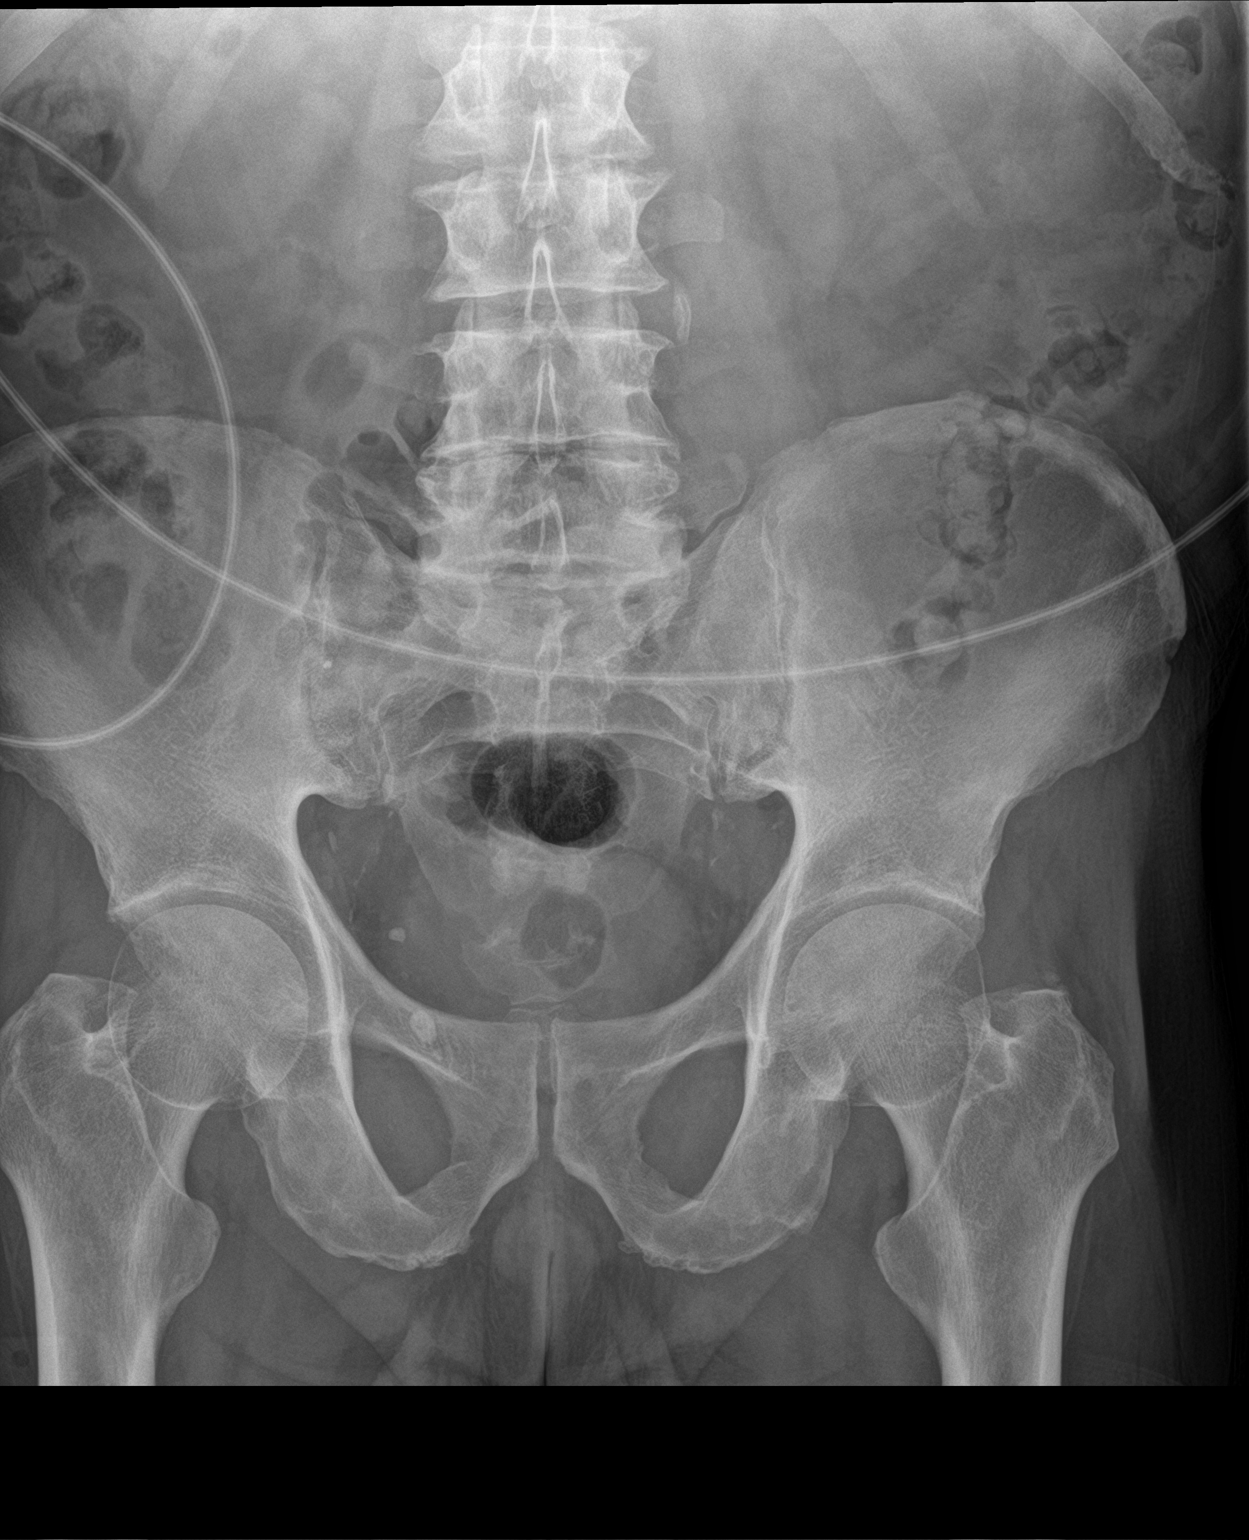

[abdomen kub (2 of 2)]
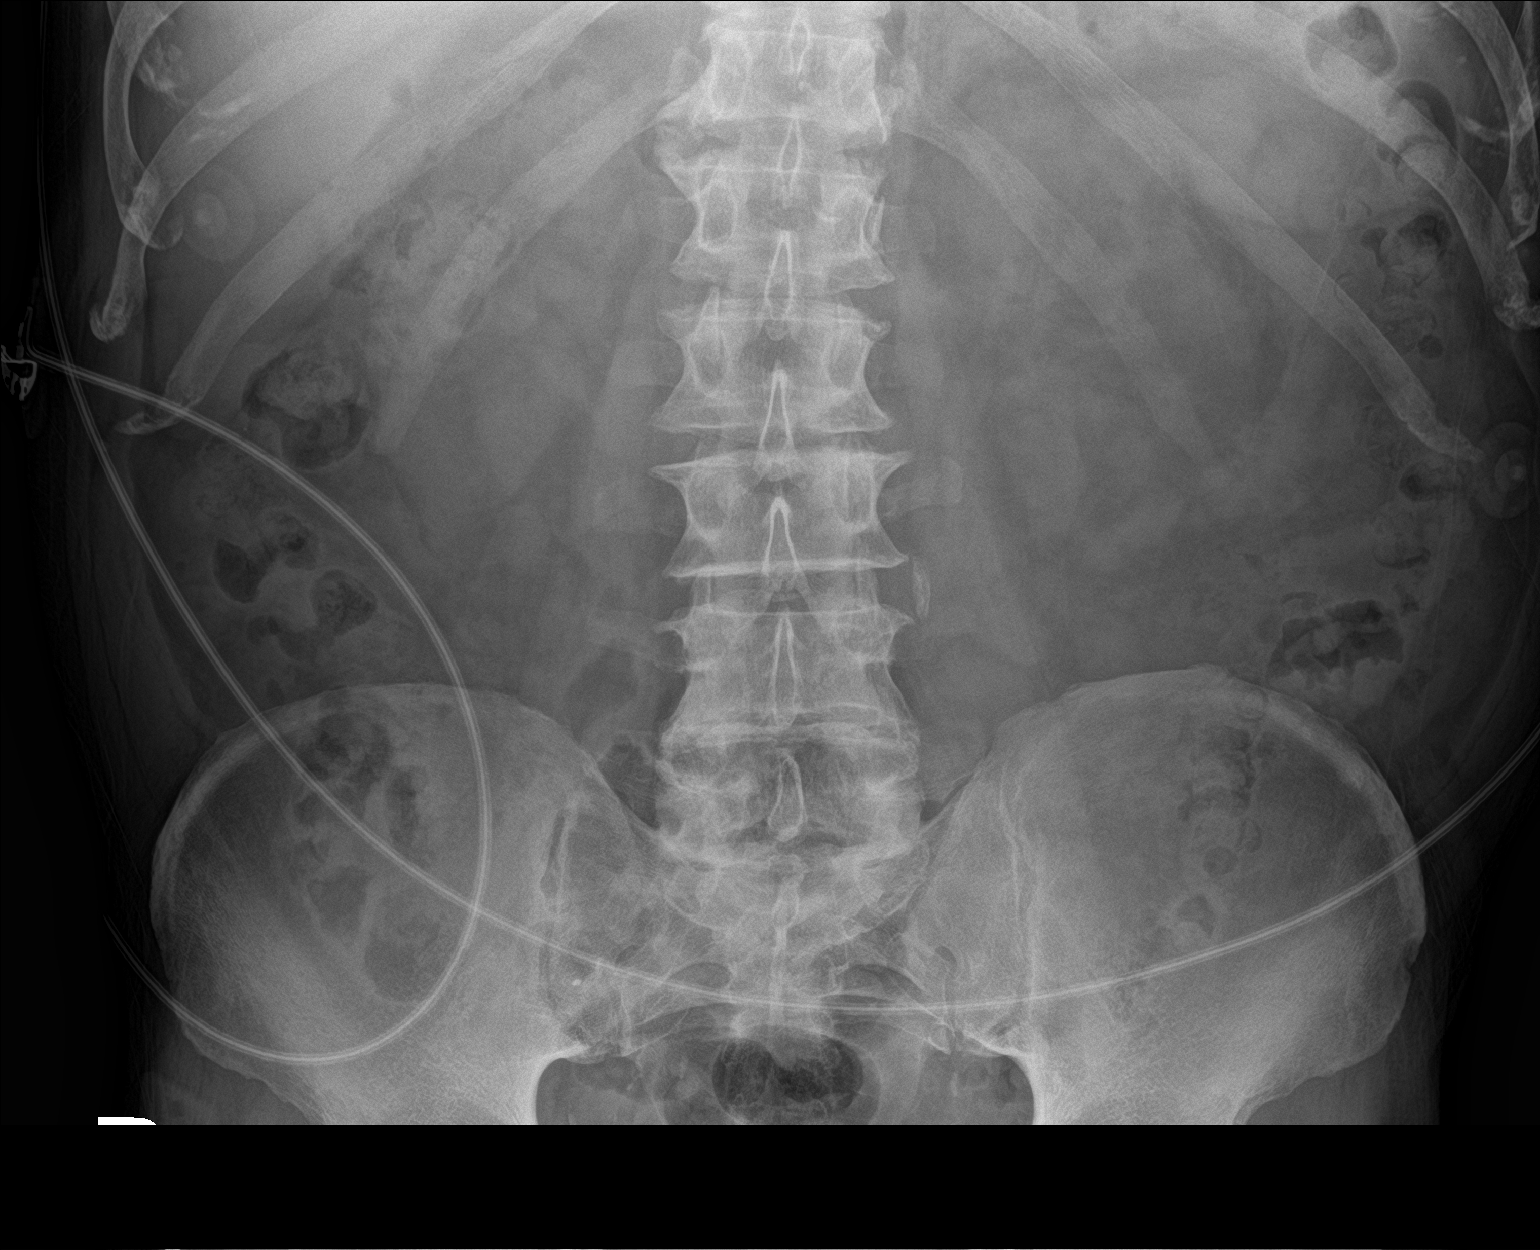

[2 of 2 positions shown; findings below may reference images not displayed]

FINDINGS: Visualized bowel gas pattern is nonobstructive. Upper abdomen is
excluded on the 2 provided supine images. No evidence of soft tissue
mass or abnormal fluid collection. No evidence of free
intraperitoneal air. Calcifications within the pelvis are most
likely vascular phleboliths. No acute or suspicious osseous finding.
IMPRESSION: No acute findings.  Nonobstructive bowel gas pattern.

## 2019-08-02 ENCOUNTER — Ambulatory Visit (INDEPENDENT_AMBULATORY_CARE_PROVIDER_SITE_OTHER): Payer: Medicare Other | Admitting: Family Medicine

## 2019-08-02 ENCOUNTER — Other Ambulatory Visit: Payer: Self-pay

## 2019-08-02 ENCOUNTER — Encounter: Payer: Self-pay | Admitting: Family Medicine

## 2019-08-02 VITALS — BP 138/80 | HR 75 | Temp 97.6°F | Resp 20 | Ht 69.0 in | Wt 172.0 lb

## 2019-08-02 DIAGNOSIS — E119 Type 2 diabetes mellitus without complications: Secondary | ICD-10-CM | POA: Diagnosis not present

## 2019-08-02 DIAGNOSIS — E1169 Type 2 diabetes mellitus with other specified complication: Secondary | ICD-10-CM

## 2019-08-02 DIAGNOSIS — I10 Essential (primary) hypertension: Secondary | ICD-10-CM

## 2019-08-02 DIAGNOSIS — E785 Hyperlipidemia, unspecified: Secondary | ICD-10-CM

## 2019-08-02 LAB — BAYER DCA HB A1C WAIVED: HB A1C (BAYER DCA - WAIVED): 6.8 % (ref <7.0)

## 2019-08-02 NOTE — Progress Notes (Signed)
Subjective:  Patient ID: Jesus Curtis, male    DOB: 15-Nov-1940  Age: 79 y.o. MRN: [id number]  CC: Medical Management of Chronic Issues   HPI Jesus Curtis presents forFollow-up of diabetes. Patient checks blood sugar at home.   110-166  fasting and postprandial (log mixed fasting and post prandial in the same column - no way to differentiate) Patient denies symptoms such as polyuria, polydipsia, excessive hunger, nausea No significant hypoglycemic spells noted. Medications reviewed. Pt reports taking them regularly without complication/adverse reaction being reported today.  Checking feet daily  History Jesus Curtis has a past medical history of Abnormal stress test, Aortic atherosclerosis ([place]), Chest pain, Elevated hemoglobin A1c, Elevated troponin, Hypertension, Nausea & vomiting, Snake bite, SVT (supraventricular tachycardia) ([place]), and Syncope.   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   His family history includes Heart attack in his brother, brother, father, and mother; Heart disease in his brother, father, mother, and paternal grandfather; Hypertension in his father, mother, and paternal grandfather; Stroke in his brother, maternal grandmother, and paternal grandmother.He reports that he has never smoked. He has never used smokeless tobacco. He reports that he does not drink alcohol or use drugs.  Current Outpatient Medications on File Prior to Visit  Medication Sig Dispense Refill  . acetaminophen (TYLENOL) 500 MG tablet Take 1,000 mg by mouth every 6 (six) hours as needed for mild pain.    Marland Kitchen amLODipine (NORVASC) 10 MG tablet Take 1 tablet (10 mg total) by mouth daily. 90 tablet 3  . atorvastatin (LIPITOR) 80 MG tablet Take 1 tablet (80 mg total) by mouth daily. 90 tablet 3  . blood glucose meter kit and supplies KIT  Dispense based on patient and insurance preference. Use up to four times daily as directed. (FOR ICD-9 250.00, 250.01). 1 each 11  . GINSENG PO Take 2 tablets by mouth daily.    Marland Kitchen glucose blood (ONE TOUCH ULTRA TEST) test strip Use to check blood sugars up to four times a day 400 each 1  . lisinopril (ZESTRIL) 40 MG tablet Take 1 tablet (40 mg total) by mouth daily. 90 tablet 2  . metFORMIN (GLUCOPHAGE-XR) 500 MG 24 hr tablet TAKE 1 TABLET BY MOUTH  DAILY WITH BREAKFAST 90 tablet 3  . metoprolol tartrate (LOPRESSOR) 100 MG tablet Take 1 tablet (100 mg total) by mouth 2 (two) times daily. 180 tablet 3  . Multiple Vitamin (ONE-A-DAY MENS PO) Take 1 tablet by mouth daily.    Marland Kitchen aspirin EC 81 MG tablet Take 81 mg by mouth daily.     No current facility-administered medications on file prior to visit.    ROS Review of Systems  Constitutional: Negative.   HENT: Negative.   Eyes: Negative for visual disturbance.  Respiratory: Negative for cough and shortness of breath.   Cardiovascular: Negative for chest pain and leg swelling.  Gastrointestinal: Negative for abdominal pain, diarrhea, nausea and vomiting.  Genitourinary: Negative for difficulty urinating.  Musculoskeletal: Negative for arthralgias and myalgias.  Skin: Negative for rash.  Neurological: Negative for headaches.  Psychiatric/Behavioral: Negative for sleep disturbance.    Objective:  BP 138/80   Pulse 75   Temp 97.6 F (36.4 C) (Oral)   Resp 20   Ht '5\' 9"'  (1.753 m)   Wt 172 lb (78 kg)   SpO2 99%   BMI 25.40 kg/m  BP Readings from Last 3 Encounters:  08/02/19 138/80  04/26/19 (!) 141/72  01/18/19 140/71    Wt Readings from Last 3 Encounters:  08/02/19 172 lb (78 kg)  04/26/19 176 lb 3.2 oz (79.9 kg)  01/18/19 174 lb (78.9 kg)     Physical Exam Constitutional:      General: He is not in acute distress.    Appearance: He is well-developed.  HENT:     Head: Normocephalic and atraumatic.     Right Ear:  External ear normal.     Left Ear: External ear normal.     Nose: Nose normal.  Eyes:     Conjunctiva/sclera: Conjunctivae normal.     Pupils: Pupils are equal, round, and reactive to light.  Cardiovascular:     Rate and Rhythm: Normal rate and regular rhythm.     Heart sounds: Normal heart sounds. No murmur.  Pulmonary:     Effort: Pulmonary effort is normal. No respiratory distress.     Breath sounds: Normal breath sounds. No wheezing or rales.  Abdominal:     Palpations: Abdomen is soft.     Tenderness: There is no abdominal tenderness.  Musculoskeletal:        General: Normal range of motion.     Cervical back: Normal range of motion and neck supple.  Skin:    General: Skin is warm and dry.  Neurological:     Mental Status: He is alert and oriented to person, place, and time.     Deep Tendon Reflexes: Reflexes are normal and symmetric.  Psychiatric:        Behavior: Behavior normal.        Thought Content: Thought content normal.        Judgment: Judgment normal.    Diabetic Foot Exam - Simple   Simple Foot Form Diabetic Foot exam was performed with the following findings: Yes 08/02/2019  4:21 PM  Visual Inspection No deformities, no ulcerations, no other skin breakdown bilaterally: Yes Sensation Testing Intact to touch and monofilament testing bilaterally: Yes Pulse Check Posterior Tibialis and Dorsalis pulse intact bilaterally: Yes Comments     Results for orders placed or performed in visit on 08/02/19  Bayer DCA Hb A1c Waived  Result Value Ref Range   HB A1C (BAYER DCA - WAIVED) 6.8 <7.0 %      Assessment & Plan:   Jesus Curtis was seen today for medical management of chronic issues.  Diagnoses and all orders for this visit:  Type 2 diabetes mellitus without complication, without long-term current use of insulin (HCC) -     CBC with Differential/Platelet -     CMP14+EGFR -     Lipid panel -     Bayer DCA Hb A1c Waived  Essential hypertension -     CBC  with Differential/Platelet -     CMP14+EGFR -     Lipid panel -     Bayer DCA Hb A1c Waived  Hyperlipidemia associated with type 2 diabetes mellitus ([place]) -     CBC with Differential/Platelet -     CMP14+EGFR -     Lipid panel -     Bayer DCA Hb A1c Waived   Labs reviewed as noted above   I am having Jesus Curtis maintain his Multiple Vitamin (ONE-A-DAY MENS PO), acetaminophen, GINSENG PO, aspirin EC, blood glucose meter kit and supplies, glucose blood, metoprolol tartrate, metFORMIN, lisinopril, amLODipine, and atorvastatin.     Follow-up: Return in about 3 months (  around 11/01/2019).  Claretta Fraise, M.D.

## 2019-08-03 LAB — CMP14+EGFR
ALT: 19 IU/L (ref 0–44)
AST: 19 IU/L (ref 0–40)
Albumin/Globulin Ratio: 1.9 (ref 1.2–2.2)
Albumin: 4.4 g/dL (ref 3.7–4.7)
Alkaline Phosphatase: 82 IU/L (ref 39–117)
BUN/Creatinine Ratio: 13 (ref 10–24)
BUN: 11 mg/dL (ref 8–27)
Bilirubin Total: 0.5 mg/dL (ref 0.0–1.2)
CO2: 22 mmol/L (ref 20–29)
Calcium: 9.8 mg/dL (ref 8.6–10.2)
Chloride: 108 mmol/L — ABNORMAL HIGH (ref 96–106)
Creatinine, Ser: 0.87 mg/dL (ref 0.76–1.27)
GFR calc Af Amer: 96 mL/min/{1.73_m2} (ref >59)
GFR calc non Af Amer: 83 mL/min/{1.73_m2} (ref >59)
Globulin, Total: 2.3 g/dL (ref 1.5–4.5)
Glucose: 93 mg/dL (ref 65–99)
Potassium: 4.7 mmol/L (ref 3.5–5.2)
Sodium: 143 mmol/L (ref 134–144)
Total Protein: 6.7 g/dL (ref 6.0–8.5)

## 2019-08-03 LAB — CBC WITH DIFFERENTIAL/PLATELET
Basophils Absolute: 0 10*3/uL (ref 0.0–0.2)
Basos: 1 %
EOS (ABSOLUTE): 0.2 10*3/uL (ref 0.0–0.4)
Eos: 2 %
Hematocrit: 39.8 % (ref 37.5–51.0)
Hemoglobin: 13.4 g/dL (ref 13.0–17.7)
Immature Grans (Abs): 0 10*3/uL (ref 0.0–0.1)
Immature Granulocytes: 0 %
Lymphocytes Absolute: 3.2 10*3/uL — ABNORMAL HIGH (ref 0.7–3.1)
Lymphs: 46 %
MCH: 30.1 pg (ref 26.6–33.0)
MCHC: 33.7 g/dL (ref 31.5–35.7)
MCV: 89 fL (ref 79–97)
Monocytes Absolute: 0.5 10*3/uL (ref 0.1–0.9)
Monocytes: 7 %
Neutrophils Absolute: 3 10*3/uL (ref 1.4–7.0)
Neutrophils: 44 %
Platelets: 222 10*3/uL (ref 150–450)
RBC: 4.45 x10E6/uL (ref 4.14–5.80)
RDW: 13.2 % (ref 11.6–15.4)
WBC: 6.8 10*3/uL (ref 3.4–10.8)

## 2019-08-03 LAB — LIPID PANEL
Chol/HDL Ratio: 2.4 ratio (ref 0.0–5.0)
Cholesterol, Total: 91 mg/dL — ABNORMAL LOW (ref 100–199)
HDL: 38 mg/dL — ABNORMAL LOW (ref >39)
LDL Chol Calc (NIH): 41 mg/dL (ref 0–99)
Triglycerides: 49 mg/dL (ref 0–149)
VLDL Cholesterol Cal: 12 mg/dL (ref 5–40)

## 2019-08-03 NOTE — Progress Notes (Signed)
Hello Esequiel,  Your lab result is normal and/or stable.Some minor variations that are not significant are commonly marked abnormal, but do not represent any medical problem for you.  Best regards, Lurae Hornbrook, M.D.

## 2019-10-31 ENCOUNTER — Ambulatory Visit (INDEPENDENT_AMBULATORY_CARE_PROVIDER_SITE_OTHER): Payer: Medicare Other | Admitting: Family Medicine

## 2019-10-31 ENCOUNTER — Other Ambulatory Visit: Payer: Self-pay

## 2019-10-31 DIAGNOSIS — J029 Acute pharyngitis, unspecified: Secondary | ICD-10-CM

## 2019-10-31 MED ORDER — FLUTICASONE PROPIONATE 50 MCG/ACT NA SUSP: 1.0000 | Freq: Two times a day (BID) | NASAL | 6 refills | Status: DC | PRN

## 2019-10-31 MED ORDER — GUAIFENESIN-DM 100-10 MG/5ML PO SYRP: 5.0000 mL | ORAL_SOLUTION | ORAL | 0 refills | Status: DC | PRN

## 2019-10-31 NOTE — Progress Notes (Signed)
   Virtual Visit via telephone Note  I connected with Jesus Perches Cyphers on 10/31/19 at 1128 by telephone and verified that I am speaking with the correct person using two identifiers. Jesus Curtis is currently located at home and Wife  are currently with her during visit. The provider, Elige Radon Damaya Channing, MD is located in their office at time of visit.  Call ended at 1135  I discussed the limitations, risks, security and privacy concerns of performing an evaluation and management service by telephone and the availability of in person appointments. I also discussed with the patient that there may be a patient responsible charge related to this service. The patient expressed understanding and agreed to proceed.   History and Present Illness: Patient is calling in for sore throat that started thursday.  He woke up with this.  He has been using robitussin DM and is helping some.  He says that it is improving but not gone. He says it is through the day. He denies any sinus infection and cough. He has a cough that is worse at night.  1. Pharyngitis, unspecified etiology       Review of Systems  Constitutional: Negative for chills and fever.  HENT: Positive for congestion and sore throat. Negative for ear discharge, ear pain, postnasal drip, rhinorrhea, sinus pressure, sneezing and voice change.   Eyes: Negative for pain, discharge, redness and visual disturbance.  Respiratory: Positive for cough. Negative for shortness of breath and wheezing.   Cardiovascular: Negative for chest pain and leg swelling.  Musculoskeletal: Negative for gait problem.  Skin: Negative for rash.  All other systems reviewed and are negative.   Observations/Objective: Patient sounds comfortable and in no acute distress  Assessment and Plan: Problem List Items Addressed This Visit    None    Visit Diagnoses    Pharyngitis, unspecified etiology    -  Primary   Relevant Medications   fluticasone (FLONASE)  50 MCG/ACT nasal spray       Follow up plan: Return if symptoms worsen or fail to improve.     I discussed the assessment and treatment plan with the patient. The patient was provided an opportunity to ask questions and all were answered. The patient agreed with the plan and demonstrated an understanding of the instructions.   The patient was advised to call back or seek an in-person evaluation if the symptoms worsen or if the condition fails to improve as anticipated.  The above assessment and management plan was discussed with the patient. The patient verbalized understanding of and has agreed to the management plan. Patient is aware to call the clinic if symptoms persist or worsen. Patient is aware when to return to the clinic for a follow-up visit. Patient educated on when it is appropriate to go to the emergency department.    I provided 7 minutes of non-face-to-face time during this encounter.    Nils Pyle, MD

## 2019-11-01 ENCOUNTER — Encounter: Payer: Self-pay | Admitting: Family Medicine

## 2019-11-01 ENCOUNTER — Ambulatory Visit (INDEPENDENT_AMBULATORY_CARE_PROVIDER_SITE_OTHER): Payer: Medicare Other | Admitting: Family Medicine

## 2019-11-01 DIAGNOSIS — R7303 Prediabetes: Secondary | ICD-10-CM

## 2019-11-01 DIAGNOSIS — I2510 Atherosclerotic heart disease of native coronary artery without angina pectoris: Secondary | ICD-10-CM

## 2019-11-01 DIAGNOSIS — I10 Essential (primary) hypertension: Secondary | ICD-10-CM

## 2019-11-01 MED ORDER — METOPROLOL TARTRATE 100 MG PO TABS: 100.0000 mg | ORAL_TABLET | Freq: Two times a day (BID) | ORAL | 0 refills | Status: DC

## 2019-11-01 NOTE — Progress Notes (Signed)
Subjective:  Patient ID: Jesus Curtis, male    DOB: 1940/05/23  Age: 79 y.o. MRN: [id number]  CC: No chief complaint on file.   HPI [name] presents forFollow-up of diabetes. Patient not checking blood sugar at home.   Patient denies symptoms such as polyuria, polydipsia, excessive hunger, nausea No significant hypoglycemic spells noted. Medications reviewed. Pt reports taking them regularly without complication/adverse reaction being reported today.  Got overheated today. Had to go home after making 7 rolls of hay. Feels better now after resting in cool environment. Also hydrating.   History Clester has a past medical history of Abnormal stress test, Aortic atherosclerosis ([place]), Chest pain, Elevated hemoglobin A1c, Elevated troponin, Hypertension, Nausea & vomiting, Snake bite, SVT (supraventricular tachycardia) ([place]), and Syncope.   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   His family history includes Heart attack in his brother, brother, father, and mother; Heart disease in his brother, father, mother, and paternal grandfather; Hypertension in his father, mother, and paternal grandfather; Stroke in his brother, maternal grandmother, and paternal grandmother.He reports that he has never smoked. He has never used smokeless tobacco. He reports that he does not drink alcohol and does not use drugs.  Current Outpatient Medications on File Prior to Visit  Medication Sig Dispense Refill  . acetaminophen (TYLENOL) 500 MG tablet Take 1,000 mg by mouth every 6 (six) hours as needed for mild pain.    Marland Kitchen amLODipine (NORVASC) 10 MG tablet Take 1 tablet (10 mg total) by mouth daily. 90 tablet 3  . aspirin EC 81 MG tablet Take 81 mg by mouth daily.    Marland Kitchen atorvastatin (LIPITOR) 80 MG tablet Take 1 tablet (80 mg total) by mouth daily. 90  tablet 3  . blood glucose meter kit and supplies KIT Dispense based on patient and insurance preference. Use up to four times daily as directed. (FOR ICD-9 250.00, 250.01). 1 each 11  . fluticasone (FLONASE) 50 MCG/ACT nasal spray Place 1 spray into both nostrils 2 (two) times daily as needed for allergies or rhinitis. 16 g 6  . GINSENG PO Take 2 tablets by mouth daily.    Marland Kitchen glucose blood (ONE TOUCH ULTRA TEST) test strip Use to check blood sugars up to four times a day 400 each 1  . lisinopril (ZESTRIL) 40 MG tablet Take 1 tablet (40 mg total) by mouth daily. 90 tablet 2  . metFORMIN (GLUCOPHAGE-XR) 500 MG 24 hr tablet TAKE 1 TABLET BY MOUTH  DAILY WITH BREAKFAST 90 tablet 3  . Multiple Vitamin (ONE-A-DAY MENS PO) Take 1 tablet by mouth daily.     No current facility-administered medications on file prior to visit.    ROS Review of Systems  Constitutional: Negative for fever.  Respiratory: Negative for shortness of breath.   Cardiovascular: Negative for chest pain.  Musculoskeletal: Negative for arthralgias.  Skin: Negative for rash.    Objective:  There were no vitals taken for this visit.  BP Readings from Last 3 Encounters:  08/02/19 138/80  04/26/19 (!) 141/72  01/18/19 140/71    Wt Readings from Last 3 Encounters:  08/02/19 172 lb (78 kg)  04/26/19 176 lb 3.2 oz (79.9 kg)  01/18/19 174 lb (78.9 kg)     Physical Exam  Exam deferred. Pt. Harboring due to COVID 19. Phone visit performed.   Assessment & Plan:  I have discontinued Foch A. Cirigliano's guaiFENesin-dextromethorphan. I am also having him maintain his Multiple Vitamin (ONE-A-DAY MENS PO), acetaminophen, GINSENG PO, aspirin EC, blood glucose meter kit and supplies, glucose blood, metFORMIN, lisinopril, amLODipine, atorvastatin, fluticasone, and metoprolol tartrate.  Meds ordered this encounter  Medications  . metoprolol tartrate (LOPRESSOR) 100 MG tablet    Sig: Take 1 tablet (100 mg total) by mouth 2  (two) times daily.    Dispense:  14 tablet    Refill:  0    Requesting 1 year supply   Virtual Visit via telephone Note  I discussed the limitations, risks, security and privacy concerns of performing an evaluation and management service by telephone and the availability of in person appointments. I also discussed with the patient that there may be a patient responsible charge related to this service. The patient expressed understanding and agreed to proceed. Pt. Is at home. Dr. Livia Snellen is in his office.  Follow Up Instructions:   I discussed the assessment and treatment plan with the patient. The patient was provided an opportunity to ask questions and all were answered. The patient agreed with the plan and demonstrated an understanding of the instructions.   The patient was advised to call back or seek an in-person evaluation if the symptoms worsen or if the condition fails to improve as anticipated.  Total minutes including chart review and phone contact time: 19   Follow-up: Return in about 3 months (around 02/01/2020).  Claretta Fraise, M.D.

## 2019-12-02 ENCOUNTER — Encounter: Payer: Self-pay | Admitting: Cardiology

## 2019-12-02 ENCOUNTER — Other Ambulatory Visit: Payer: Self-pay

## 2019-12-02 ENCOUNTER — Ambulatory Visit: Payer: Medicare Other | Admitting: Cardiology

## 2019-12-02 VITALS — BP 142/70 | HR 60 | Ht 69.0 in | Wt 170.0 lb

## 2019-12-02 DIAGNOSIS — Z79899 Other long term (current) drug therapy: Secondary | ICD-10-CM

## 2019-12-02 DIAGNOSIS — I10 Essential (primary) hypertension: Secondary | ICD-10-CM | POA: Diagnosis not present

## 2019-12-02 MED ORDER — CHLORTHALIDONE 25 MG PO TABS
12.5000 mg | ORAL_TABLET | Freq: Every day | ORAL | 3 refills | Status: DC
Start: 2019-12-02 — End: 2020-05-28

## 2019-12-02 NOTE — Patient Instructions (Addendum)
Medication Instructions:  START CHLORTHALIDONE 12.5 MG DAILY   TAKE YOUR ASPIRIN EVERY DAY   *If you need a refill on your cardiac medications before your next appointment, please call your pharmacy*   Lab Work:  BMET  AND MAGNESIUM IN 2 WEEKS   If you have labs (blood work) drawn today and your tests are completely normal, you will receive your results only by: Marland Kitchen MyChart Message (if you have MyChart) OR . A paper copy in the mail If you have any lab test that is abnormal or we need to change your treatment, we will call you to review the results.   Testing/Procedures: non   Follow-Up: At [facility], you and your health needs are our priority.  As part of our continuing mission to provide you with exceptional heart care, we have created designated Provider Care Teams.  These Care Teams include your primary Cardiologist (physician) and Advanced Practice Providers (APPs -  Physician Assistants and Nurse Practitioners) who all work together to provide you with the care you need, when you need it.  We recommend signing up for the patient portal called "MyChart".  Sign up information is provided on this After Visit Summary.  MyChart is used to connect with patients for Virtual Visits (Telemedicine).  Patients are able to view lab/test results, encounter notes, upcoming appointments, etc.  Non-urgent messages can be sent to your provider as well.   To learn more about what you can do with MyChart, go to [email or web address].    Your next appointment:   12 month(s)  The format for your next appointment:   In Person  Provider:   Dina Rich, MD   Other Instructions NONE

## 2019-12-02 NOTE — Progress Notes (Signed)
Clinical Summary Jesus Curtis is a 79 y.o.male seen for the following medical problems.  1. CAD - 11/2016 cath with severe multivessel disease, referred for CABG - 8/6/18CABGx4 (left internal mammary artery to left anterior descending, saphenous vein graft to diagonal, saphenous vein graft to circumflex marginal, saphenous vein graft to posterior descending Jesus Curtis of the distal circumflex) - 11/2016 echo LVEF 55-60%, grade II diastolic dysfunction    - no recent chest pain. No SOB or DOe - compliant with meds   2. HTN - home bps 140s/70s - compliant with meds   3. Hyperlipidemia  07/2019 TC 91 TG 49 HDL 48 LDL 41  4. DM2 - followed by pcp, 07/2019 HgbA1c was at goal 6.8  Past Medical History:  Diagnosis Date  . Abnormal stress test   . Aortic atherosclerosis ([place])   . Chest pain   . Elevated hemoglobin A1c   . Elevated troponin   . Hypertension   . Nausea & vomiting   . Snake bite    AS A CHILD HAD PMH  . SVT (supraventricular tachycardia) ([place])   . Syncope      Allergies  Allergen Reactions  . Ativan [Lorazepam] Other (See Comments)    "goes wild"     Current Outpatient Medications  Medication Sig Dispense Refill  . acetaminophen (TYLENOL) 500 MG tablet Take 1,000 mg by mouth every 6 (six) hours as needed for mild pain.    Marland Kitchen amLODipine (NORVASC) 10 MG tablet Take 1 tablet (10 mg total) by mouth daily. 90 tablet 3  . aspirin EC 81 MG tablet Take 81 mg by mouth daily.    Marland Kitchen atorvastatin (LIPITOR) 80 MG tablet Take 1 tablet (80 mg total) by mouth daily. 90 tablet 3  . blood glucose meter kit and supplies KIT Dispense based on patient and insurance preference. Use up to four times daily as directed. (FOR ICD-9 250.00, 250.01). 1 each 11  . fluticasone (FLONASE) 50 MCG/ACT nasal spray Place 1 spray into both nostrils 2 (two) times daily as needed for allergies or rhinitis. 16 g 6  . GINSENG PO Take 2 tablets by mouth daily.    Marland Kitchen glucose  blood (ONE TOUCH ULTRA TEST) test strip Use to check blood sugars up to four times a day 400 each 1  . lisinopril (ZESTRIL) 40 MG tablet Take 1 tablet (40 mg total) by mouth daily. 90 tablet 2  . metFORMIN (GLUCOPHAGE-XR) 500 MG 24 hr tablet TAKE 1 TABLET BY MOUTH  DAILY WITH BREAKFAST 90 tablet 3  . metoprolol tartrate (LOPRESSOR) 100 MG tablet Take 1 tablet (100 mg total) by mouth 2 (two) times daily. 14 tablet 0  . Multiple Vitamin (ONE-A-DAY MENS PO) Take 1 tablet by mouth daily.     No current facility-administered medications for this visit.     Past Surgical History:  Procedure Laterality Date  . CORONARY ARTERY BYPASS GRAFT N/A 11/24/2016   Procedure: CORONARY ARTERY BYPASS GRAFTING (CABG), ON PUMP, TIMES FOUR, USING LEFT INTERNAL MAMMARY ARTERY AND ENDOSCOPICALLY HARVESTED RIGHT GREATER SAPHENOUS VEIN;  Surgeon: Ivin Poot, MD;  Location: [facility];  Service: Open Heart Surgery;  Laterality: N/A;  LIMA to LAD SVG to PDA SVG to OM1 SVG to Diag1  . LEFT HEART CATH AND CORONARY ANGIOGRAPHY N/A 11/21/2016   Procedure: LEFT HEART CATH AND CORONARY ANGIOGRAPHY;  Surgeon: Troy Sine, MD;  Location: [facility];  Service: Cardiovascular;  Laterality: N/A;  . TEE WITHOUT CARDIOVERSION  N/A 11/24/2016   Procedure: TRANSESOPHAGEAL ECHOCARDIOGRAM (TEE);  Surgeon: Prescott Gum, Collier Salina, MD;  Location: [place] [place];  Service: Open Heart Surgery;  Laterality: N/A;     Allergies  Allergen Reactions  . Ativan [Lorazepam] Other (See Comments)    "goes wild"      Family History  Problem Relation Age of Onset  . Heart attack Mother   . Heart disease Mother   . Hypertension Mother   . Heart attack Father   . Heart disease Father   . Hypertension Father   . Heart attack Brother   . Heart disease Brother   . Stroke Brother   . Stroke Maternal Grandmother   . Stroke Paternal Grandmother   . Heart disease Paternal Grandfather   . Hypertension Paternal Grandfather   . Heart attack Brother        Social History Jesus Curtis reports that he has never smoked. He has never used smokeless tobacco. Jesus Curtis reports no history of alcohol use.   Review of Systems CONSTITUTIONAL: No weight loss, fever, chills, weakness or fatigue.  HEENT: Eyes: No visual loss, blurred vision, double vision or yellow sclerae.No hearing loss, sneezing, congestion, runny nose or sore throat.  SKIN: No rash or itching.  CARDIOVASCULAR: per hpi RESPIRATORY: No shortness of breath, cough or sputum.  GASTROINTESTINAL: No anorexia, nausea, vomiting or diarrhea. No abdominal pain or blood.  GENITOURINARY: No burning on urination, no polyuria NEUROLOGICAL: No headache, dizziness, syncope, paralysis, ataxia, numbness or tingling in the extremities. No change in bowel or bladder control.  MUSCULOSKELETAL: No muscle, back pain, joint pain or stiffness.  LYMPHATICS: No enlarged nodes. No history of splenectomy.  PSYCHIATRIC: No history of depression or anxiety.  ENDOCRINOLOGIC: No reports of sweating, cold or heat intolerance. No polyuria or polydipsia.  Marland Kitchen   Physical Examination Vitals:   12/02/19 0803  BP: (!) 142/70  Pulse: 60  SpO2: 95%   Filed Weights   12/02/19 0803  Weight: 170 lb (77.1 kg)    Gen: resting comfortably, no acute distress HEENT: no scleral icterus, pupils equal round and reactive, no palptable cervical adenopathy,  CV: RRR, no m/r/g, no jvd Resp: Clear to auscultation bilaterally GI: abdomen is soft, non-tender, non-distended, normal bowel sounds, no hepatosplenomegaly MSK: extremities are warm, no edema.  Skin: warm, no rash Neuro:  no focal deficits Psych: appropriate affect   Diagnostic Studies 11/2016 cath   LM lesion, 30 %stenosed.  Ost LAD to Prox LAD lesion, 30 %stenosed.  Mid LAD lesion, 90 %stenosed.  1st Mrg lesion, 50 %stenosed.  Ost Cx to Prox Cx lesion, 70 %stenosed.  Prox Cx to Dist Cx lesion, 70 %stenosed.  Ost RCA to Prox RCA lesion, 80  %stenosed.  Mid RCA lesion, 100 %stenosed.  LV end diastolic pressure is normal.   11/2016 echo   - Left ventricle: The cavity size was normal. Systolic function was normal. The estimated ejection fraction was in the range of 55% to 60%. Wall motion was normal; there were no regional wall motion abnormalities. Features are consistent with a pseudonormal left ventricular filling pattern, with concomitant abnormal relaxation and increased filling pressure (grade 2 diastolic dysfunction). Doppler parameters are consistent with elevated mean left atrial filling pressure. - Left atrium: The atrium was mildly dilated. - Pericardium, extracardiac: A trivial pericardial effusion was identified.   11/2016 carotid US Findings suggest 1-39% internal carotid artery stenosis bilaterally. Vertebral arteries are patent with antegrade flow.    Assessment and Plan  1. CAD -  no symptoms, continue current meds - EKG today show SR, RBBB, no acute ischemic changes  2. HTN -not at goal, start chlorthalidone 12.34m daily, check BMET/Mg in 2 weeks  3.Hyperlipidemia - at goal, continue statin  4. DM2 - from cardiac standpoint continue ACE-I, statin   F/u 1 year    JArnoldo Lenis M.D.

## 2019-12-16 ENCOUNTER — Other Ambulatory Visit: Payer: Self-pay

## 2019-12-16 ENCOUNTER — Other Ambulatory Visit: Payer: Medicare Other

## 2019-12-16 ENCOUNTER — Telehealth: Payer: Self-pay | Admitting: Cardiology

## 2019-12-16 DIAGNOSIS — Z79899 Other long term (current) drug therapy: Secondary | ICD-10-CM | POA: Diagnosis not present

## 2019-12-16 NOTE — Addendum Note (Signed)
Addended by: Kerney Elbe on: 12/16/2019 10:28 AM   Modules accepted: Orders

## 2019-12-16 NOTE — Telephone Encounter (Signed)
Lab orders changed to [facility] 

## 2019-12-16 NOTE — Telephone Encounter (Signed)
Per [facility] Pt is waiting   Please change labs order from [facility] to [facility] [phone]   Thanks renee

## 2019-12-17 LAB — MAGNESIUM: Magnesium: 1.8 mg/dL (ref 1.6–2.3)

## 2019-12-17 LAB — BASIC METABOLIC PANEL
BUN/Creatinine Ratio: 16 (ref 10–24)
BUN: 14 mg/dL (ref 8–27)
CO2: 22 mmol/L (ref 20–29)
Calcium: 9.8 mg/dL (ref 8.6–10.2)
Chloride: 101 mmol/L (ref 96–106)
Creatinine, Ser: 0.9 mg/dL (ref 0.76–1.27)
GFR calc Af Amer: 95 mL/min/{1.73_m2} (ref >59)
GFR calc non Af Amer: 82 mL/min/{1.73_m2} (ref >59)
Glucose: 187 mg/dL — ABNORMAL HIGH (ref 65–99)
Potassium: 4.2 mmol/L (ref 3.5–5.2)
Sodium: 136 mmol/L (ref 134–144)

## 2019-12-21 ENCOUNTER — Telehealth: Payer: Self-pay | Admitting: Family Medicine

## 2019-12-21 ENCOUNTER — Telehealth: Payer: Self-pay | Admitting: Cardiology

## 2019-12-21 NOTE — Telephone Encounter (Signed)
New message     Patient wife is calling to get her husband lab results from last Friday

## 2019-12-21 NOTE — Telephone Encounter (Signed)
Please result labs

## 2019-12-21 NOTE — Telephone Encounter (Signed)
Informed that lab work was good except glucose elevated. Advised to follow up with PCP about this and copy would be sent.  Patient c/o feeling fatigue and lightheaded since starting chlorthalidone. Says when he takes it, he feels bad. Advised these symptoms usually improve after being on it for several weeks. Reports he is unable to check his BP and HR at home. Advised to hold chlorthalidone to see if symptoms improve and call office back next week with an update. Verbalized understanding.

## 2019-12-21 NOTE — Telephone Encounter (Signed)
Patient aware he will have to call ordering providers office.

## 2019-12-22 NOTE — Telephone Encounter (Signed)
Update [place] in a week if ongoing issues, if so would likely change to aldactone  Dominga Ferry MD

## 2020-03-21 ENCOUNTER — Ambulatory Visit: Payer: Medicare Other | Admitting: Family Medicine

## 2020-03-23 ENCOUNTER — Other Ambulatory Visit: Payer: Self-pay | Admitting: Family Medicine

## 2020-03-23 DIAGNOSIS — I10 Essential (primary) hypertension: Secondary | ICD-10-CM

## 2020-04-23 ENCOUNTER — Ambulatory Visit: Payer: Medicare Other | Admitting: Family Medicine

## 2020-05-03 ENCOUNTER — Ambulatory Visit: Payer: Medicare Other | Admitting: Family Medicine

## 2020-05-03 DIAGNOSIS — E1169 Type 2 diabetes mellitus with other specified complication: Secondary | ICD-10-CM

## 2020-05-03 DIAGNOSIS — I10 Essential (primary) hypertension: Secondary | ICD-10-CM

## 2020-05-03 DIAGNOSIS — E119 Type 2 diabetes mellitus without complications: Secondary | ICD-10-CM

## 2020-05-28 ENCOUNTER — Encounter: Payer: Self-pay | Admitting: Family Medicine

## 2020-05-28 ENCOUNTER — Ambulatory Visit (INDEPENDENT_AMBULATORY_CARE_PROVIDER_SITE_OTHER): Payer: Medicare Other | Admitting: Family Medicine

## 2020-05-28 ENCOUNTER — Other Ambulatory Visit: Payer: Self-pay

## 2020-05-28 VITALS — BP 135/82 | HR 64 | Temp 97.4°F | Ht 69.0 in | Wt 170.6 lb

## 2020-05-28 DIAGNOSIS — I10 Essential (primary) hypertension: Secondary | ICD-10-CM

## 2020-05-28 DIAGNOSIS — Z1159 Encounter for screening for other viral diseases: Secondary | ICD-10-CM

## 2020-05-28 DIAGNOSIS — I2510 Atherosclerotic heart disease of native coronary artery without angina pectoris: Secondary | ICD-10-CM

## 2020-05-28 DIAGNOSIS — E119 Type 2 diabetes mellitus without complications: Secondary | ICD-10-CM

## 2020-05-28 LAB — BAYER DCA HB A1C WAIVED: HB A1C (BAYER DCA - WAIVED): 6.7 % (ref <7.0)

## 2020-05-28 MED ORDER — METFORMIN HCL ER 500 MG PO TB24: ORAL_TABLET | ORAL | 3 refills | Status: DC

## 2020-05-28 MED ORDER — LISINOPRIL 40 MG PO TABS: 40.0000 mg | ORAL_TABLET | Freq: Every day | ORAL | 3 refills | Status: DC

## 2020-05-28 MED ORDER — AMLODIPINE BESYLATE 10 MG PO TABS
10.0000 mg | ORAL_TABLET | Freq: Every day | ORAL | 3 refills | Status: DC
Start: 2020-05-28 — End: 2021-04-02

## 2020-05-28 MED ORDER — ATORVASTATIN CALCIUM 80 MG PO TABS
80.0000 mg | ORAL_TABLET | Freq: Every day | ORAL | 3 refills | Status: DC
Start: 2020-05-28 — End: 2021-04-02

## 2020-05-28 NOTE — Progress Notes (Signed)
Subjective:  Patient ID: Jesus Curtis,  male    DOB: Jun 27, 1940  Age: 80 y.o.    CC: Medical Management of Chronic Issues   HPI Jesus Curtis presents for  follow-up of hypertension. Patient has no history of headache chest pain or shortness of breath or recent cough. Patient also denies symptoms of TIA such as numbness weakness lateralizing. Patient denies side effects from medication. States taking it regularly. DCed Chlorthalidone due to orthostasis.  Patient also  in for follow-up of elevated cholesterol. Doing well without complaints on current medication. Denies side effects  including myalgia and arthralgia and nausea. Also in today for liver function testing. Currently no chest pain, shortness of breath or other cardiovascular related symptoms noted.  Follow-up of diabetes. Patient does check blood sugar at home. Readings run fasting about 130. Patient denies symptoms such as excessive hunger or urinary frequency, excessive hunger, nausea No significant hypoglycemic spells noted. Medications reviewed. Pt reports taking them regularly. Pt. denies complication/adverse reaction today.    History Jesus Curtis has a past medical history of Abnormal stress test, Aortic atherosclerosis ([place]), Chest pain, Elevated hemoglobin A1c, Elevated troponin, Hypertension, Nausea & vomiting, Snake bite, SVT (supraventricular tachycardia) ([place]), and Syncope.   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   His family history includes Heart attack in his brother, brother, father, and mother; Heart disease in his brother, father, mother, and paternal grandfather; Hypertension in his father, mother, and paternal grandfather; Stroke in his brother, maternal grandmother, and paternal grandmother.He reports that he has never smoked. He has never used  smokeless tobacco. He reports that he does not drink alcohol and does not use drugs.  Current Outpatient Medications on File Prior to Visit  Medication Sig Dispense Refill  . acetaminophen (TYLENOL) 500 MG tablet Take 1,000 mg by mouth every 6 (six) hours as needed for mild pain.    Marland Kitchen aspirin EC 81 MG tablet Take 81 mg by mouth daily.    . blood glucose meter kit and supplies KIT Dispense based on patient and insurance preference. Use up to four times daily as directed. (FOR ICD-9 250.00, 250.01). 1 each 11  . fluticasone (FLONASE) 50 MCG/ACT nasal spray Place 1 spray into both nostrils 2 (two) times daily as needed for allergies or rhinitis. 16 g 6  . GINSENG PO Take 2 tablets by mouth daily.    Marland Kitchen glucose blood (ONE TOUCH ULTRA TEST) test strip Use to check blood sugars up to four times a day 400 each 1  . metoprolol tartrate (LOPRESSOR) 100 MG tablet Take 1 tablet (100 mg total) by mouth 2 (two) times daily. 14 tablet 0  . Multiple Vitamin (ONE-A-DAY MENS PO) Take 1 tablet by mouth daily.     No current facility-administered medications on file prior to visit.    ROS Review of Systems  Constitutional: Negative for fever.  Respiratory: Negative for shortness of breath.   Cardiovascular: Negative for chest pain.  Musculoskeletal: Negative for arthralgias.  Skin: Negative for rash.    Objective:  BP 135/82   Pulse 64   Temp (!) 97.4 F (36.3 C) (Temporal)   Ht '5\' 9"'  (1.753 m)   Wt 170 lb 9.6 oz (77.4 kg)   BMI 25.19 kg/m   BP Readings from Last 3 Encounters:  05/28/20 135/82  12/02/19 (!) 142/70  08/02/19 138/80  Wt Readings from Last 3 Encounters:  05/28/20 170 lb 9.6 oz (77.4 kg)  12/02/19 170 lb (77.1 kg)  08/02/19 172 lb (78 kg)     Physical Exam Vitals reviewed.  Constitutional:      Appearance: He is well-developed and well-nourished.  HENT:     Head: Normocephalic and atraumatic.     Right Ear: Tympanic membrane and external ear normal. No decreased  hearing noted.     Left Ear: Tympanic membrane and external ear normal. No decreased hearing noted.     Mouth/Throat:     Pharynx: No oropharyngeal exudate or posterior oropharyngeal erythema.  Eyes:     Pupils: Pupils are equal, round, and reactive to light.  Cardiovascular:     Rate and Rhythm: Normal rate and regular rhythm.     Heart sounds: No murmur heard.   Pulmonary:     Effort: No respiratory distress.     Breath sounds: Normal breath sounds.  Abdominal:     General: Bowel sounds are normal.     Palpations: Abdomen is soft. There is no mass.     Tenderness: There is no abdominal tenderness.  Musculoskeletal:     Cervical back: Normal range of motion and neck supple.       Results for orders placed or performed in visit on 05/28/20  Bayer DCA Hb A1c Waived  Result Value Ref Range   HB A1C (BAYER DCA - WAIVED) 6.7 <7.0 %    Assessment & Plan:   Jesus Curtis was seen today for medical management of chronic issues.  Diagnoses and all orders for this visit:  Need for hepatitis C screening test -     Hepatitis C antibody  Type 2 diabetes mellitus without complication, without long-term current use of insulin (HCC) -     metFORMIN (GLUCOPHAGE-XR) 500 MG 24 hr tablet; TAKE 1 TABLET BY MOUTH  DAILY WITH BREAKFAST -     atorvastatin (LIPITOR) 80 MG tablet; Take 1 tablet (80 mg total) by mouth daily. -     CBC with Differential/Platelet -     CMP14+EGFR -     Lipid panel -     Bayer DCA Hb A1c Waived  Essential hypertension -     lisinopril (ZESTRIL) 40 MG tablet; Take 1 tablet (40 mg total) by mouth daily. -     amLODipine (NORVASC) 10 MG tablet; Take 1 tablet (10 mg total) by mouth daily. -     CBC with Differential/Platelet -     CMP14+EGFR -     Lipid panel  ASCVD (arteriosclerotic cardiovascular disease) -     atorvastatin (LIPITOR) 80 MG tablet; Take 1 tablet (80 mg total) by mouth daily. -     CBC with Differential/Platelet -     CMP14+EGFR   I have  discontinued Jesus Curtis's chlorthalidone. I have also changed his lisinopril. Additionally, I am having him maintain his Multiple Vitamin (ONE-A-DAY MENS PO), acetaminophen, GINSENG PO, aspirin EC, blood glucose meter kit and supplies, glucose blood, fluticasone, metoprolol tartrate, metFORMIN, atorvastatin, and amLODipine.  Meds ordered this encounter  Medications  . metFORMIN (GLUCOPHAGE-XR) 500 MG 24 hr tablet    Sig: TAKE 1 TABLET BY MOUTH  DAILY WITH BREAKFAST    Dispense:  90 tablet    Refill:  3    Requesting 1 year supply  . lisinopril (ZESTRIL) 40 MG tablet    Sig: Take 1 tablet (40 mg total) by mouth daily.    Dispense:  90  tablet    Refill:  3  . atorvastatin (LIPITOR) 80 MG tablet    Sig: Take 1 tablet (80 mg total) by mouth daily.    Dispense:  90 tablet    Refill:  3    Requesting 1 year supply  . amLODipine (NORVASC) 10 MG tablet    Sig: Take 1 tablet (10 mg total) by mouth daily.    Dispense:  90 tablet    Refill:  3     Follow-up: Return in about 3 months (around 08/25/2020).  Claretta Fraise, M.D.

## 2020-05-29 LAB — CMP14+EGFR
ALT: 24 IU/L (ref 0–44)
AST: 22 IU/L (ref 0–40)
Albumin/Globulin Ratio: 2.2 (ref 1.2–2.2)
Albumin: 4.2 g/dL (ref 3.7–4.7)
Alkaline Phosphatase: 80 IU/L (ref 44–121)
BUN/Creatinine Ratio: 8 — ABNORMAL LOW (ref 10–24)
BUN: 8 mg/dL (ref 8–27)
Bilirubin Total: 0.5 mg/dL (ref 0.0–1.2)
CO2: 21 mmol/L (ref 20–29)
Calcium: 9.7 mg/dL (ref 8.6–10.2)
Chloride: 105 mmol/L (ref 96–106)
Creatinine, Ser: 0.96 mg/dL (ref 0.76–1.27)
GFR calc Af Amer: 87 mL/min/{1.73_m2} (ref >59)
GFR calc non Af Amer: 75 mL/min/{1.73_m2} (ref >59)
Globulin, Total: 1.9 g/dL (ref 1.5–4.5)
Glucose: 126 mg/dL — ABNORMAL HIGH (ref 65–99)
Potassium: 4.6 mmol/L (ref 3.5–5.2)
Sodium: 140 mmol/L (ref 134–144)
Total Protein: 6.1 g/dL (ref 6.0–8.5)

## 2020-05-29 LAB — CBC WITH DIFFERENTIAL/PLATELET
Basophils Absolute: 0 10*3/uL (ref 0.0–0.2)
Basos: 0 %
EOS (ABSOLUTE): 0.2 10*3/uL (ref 0.0–0.4)
Eos: 3 %
Hematocrit: 40.3 % (ref 37.5–51.0)
Hemoglobin: 13.4 g/dL (ref 13.0–17.7)
Immature Grans (Abs): 0 10*3/uL (ref 0.0–0.1)
Immature Granulocytes: 0 %
Lymphocytes Absolute: 2.5 10*3/uL (ref 0.7–3.1)
Lymphs: 43 %
MCH: 29.9 pg (ref 26.6–33.0)
MCHC: 33.3 g/dL (ref 31.5–35.7)
MCV: 90 fL (ref 79–97)
Monocytes Absolute: 0.4 10*3/uL (ref 0.1–0.9)
Monocytes: 7 %
Neutrophils Absolute: 2.6 10*3/uL (ref 1.4–7.0)
Neutrophils: 47 %
Platelets: 221 10*3/uL (ref 150–450)
RBC: 4.48 x10E6/uL (ref 4.14–5.80)
RDW: 13.5 % (ref 11.6–15.4)
WBC: 5.8 10*3/uL (ref 3.4–10.8)

## 2020-05-29 LAB — LIPID PANEL
Chol/HDL Ratio: 2.7 ratio (ref 0.0–5.0)
Cholesterol, Total: 95 mg/dL — ABNORMAL LOW (ref 100–199)
HDL: 35 mg/dL — ABNORMAL LOW (ref >39)
LDL Chol Calc (NIH): 42 mg/dL (ref 0–99)
Triglycerides: 88 mg/dL (ref 0–149)
VLDL Cholesterol Cal: 18 mg/dL (ref 5–40)

## 2020-05-29 LAB — HEPATITIS C ANTIBODY: Hep C Virus Ab: <0.1 s/co ratio (ref 0.0–0.9)

## 2020-05-29 NOTE — Progress Notes (Signed)
Hello Lateef,  Your lab result is normal and/or stable.Some minor variations that are not significant are commonly marked abnormal, but do not represent any medical problem for you.  Best regards, Christee Mervine, M.D.

## 2020-06-12 ENCOUNTER — Other Ambulatory Visit: Payer: Self-pay | Admitting: Family Medicine

## 2020-06-12 DIAGNOSIS — I10 Essential (primary) hypertension: Secondary | ICD-10-CM

## 2020-06-12 DIAGNOSIS — I2510 Atherosclerotic heart disease of native coronary artery without angina pectoris: Secondary | ICD-10-CM

## 2020-06-27 ENCOUNTER — Ambulatory Visit (INDEPENDENT_AMBULATORY_CARE_PROVIDER_SITE_OTHER): Payer: Medicare Other | Admitting: Family Medicine

## 2020-06-27 ENCOUNTER — Encounter: Payer: Self-pay | Admitting: Family Medicine

## 2020-06-27 DIAGNOSIS — J00 Acute nasopharyngitis [common cold]: Secondary | ICD-10-CM

## 2020-06-27 MED ORDER — IPRATROPIUM BROMIDE 0.03 % NA SOLN: 2.0000 | Freq: Two times a day (BID) | NASAL | 0 refills | Status: DC

## 2020-06-27 NOTE — Progress Notes (Signed)
Virtual Visit via Telephone Note  I connected with Jesus Curtis on 06/27/20 at 11:45 AM by telephone and verified that I am speaking with the correct person using two identifiers. Jesus Curtis is currently located in the vehicle and his wife is currently with him during this visit. The provider, Loman Brooklyn, FNP is located in their office at time of visit.  I discussed the limitations, risks, security and privacy concerns of performing an evaluation and management service by telephone and the availability of in person appointments. I also discussed with the patient that there may be a patient responsible charge related to this service. The patient expressed understanding and agreed to proceed.  Subjective: PCP: Claretta Fraise, MD  Chief Complaint  Patient presents with  . URI   Patient complains of cough, runny nose and sneezing. Onset of symptoms was 2 days ago, unchanged since that time. He is drinking plenty of fluids. Evaluation to date: none. Treatment to date: none.  He does not smoke. He has not been vaccinated against COVID-19.    ROS: Per HPI  Current Outpatient Medications:  .  acetaminophen (TYLENOL) 500 MG tablet, Take 1,000 mg by mouth every 6 (six) hours as needed for mild pain., Disp: , Rfl:  .  amLODipine (NORVASC) 10 MG tablet, Take 1 tablet (10 mg total) by mouth daily., Disp: 90 tablet, Rfl: 3 .  aspirin EC 81 MG tablet, Take 81 mg by mouth daily., Disp: , Rfl:  .  atorvastatin (LIPITOR) 80 MG tablet, Take 1 tablet (80 mg total) by mouth daily., Disp: 90 tablet, Rfl: 3 .  blood glucose meter kit and supplies KIT, Dispense based on patient and insurance preference. Use up to four times daily as directed. (FOR ICD-9 250.00, 250.01)., Disp: 1 each, Rfl: 11 .  fluticasone (FLONASE) 50 MCG/ACT nasal spray, Place 1 spray into both nostrils 2 (two) times daily as needed for allergies or rhinitis., Disp: 16 g, Rfl: 6 .  GINSENG PO, Take 2 tablets by mouth  daily., Disp: , Rfl:  .  glucose blood (ONE TOUCH ULTRA TEST) test strip, Use to check blood sugars up to four times a day, Disp: 400 each, Rfl: 1 .  lisinopril (ZESTRIL) 40 MG tablet, Take 1 tablet (40 mg total) by mouth daily., Disp: 90 tablet, Rfl: 3 .  metFORMIN (GLUCOPHAGE-XR) 500 MG 24 hr tablet, TAKE 1 TABLET BY MOUTH  DAILY WITH BREAKFAST, Disp: 90 tablet, Rfl: 3 .  metoprolol tartrate (LOPRESSOR) 100 MG tablet, TAKE 1 TABLET BY MOUTH  TWICE DAILY, Disp: 180 tablet, Rfl: 1 .  Multiple Vitamin (ONE-A-DAY MENS PO), Take 1 tablet by mouth daily., Disp: , Rfl:   Allergies  Allergen Reactions  . Ativan [Lorazepam] Other (See Comments)    "goes wild"   Past Medical History:  Diagnosis Date  . Abnormal stress test   . Aortic atherosclerosis ([place])   . Chest pain   . Elevated hemoglobin A1c   . Elevated troponin   . Hypertension   . Nausea & vomiting   . Snake bite    AS A CHILD HAD PMH  . SVT (supraventricular tachycardia) ([place])   . Syncope     Observations/Objective: A&O  No respiratory distress or wheezing audible over the phone Mood, judgement, and thought processes all WNL  Assessment and Plan: 1. Acute nasopharyngitis Patient declined testing for COVID-19.  Rx'd Atrovent nasal spray with directions not to use more than 3 days.  Advised he could  use Flonase and antihistamine for symptoms as well.  Discussed normal course of the common cold and should he worsen or not improve to call us back and let us know. - ipratropium (ATROVENT) 0.03 % nasal spray; Place 2 sprays into both nostrils every 12 (twelve) hours for 3 days.  Dispense: 30 mL; Refill: 0   Follow Up Instructions:  I discussed the assessment and treatment plan with the patient. The patient was provided an opportunity to ask questions and all were answered. The patient agreed with the plan and demonstrated an understanding of the instructions.   The patient was advised to call back or seek an in-person evaluation  if the symptoms worsen or if the condition fails to improve as anticipated.  The above assessment and management plan was discussed with the patient. The patient verbalized understanding of and has agreed to the management plan. Patient is aware to call the clinic if symptoms persist or worsen. Patient is aware when to return to the clinic for a follow-up visit. Patient educated on when it is appropriate to go to the emergency department.   Time call ended: 11:52 AM  I provided 7 minutes of non-face-to-face time during this encounter.  Hendricks Limes, MSN, APRN, FNP-C [facility] 06/27/20

## 2020-07-04 ENCOUNTER — Other Ambulatory Visit: Payer: Self-pay

## 2020-07-04 ENCOUNTER — Encounter: Payer: Self-pay | Admitting: Family Medicine

## 2020-07-04 ENCOUNTER — Ambulatory Visit (INDEPENDENT_AMBULATORY_CARE_PROVIDER_SITE_OTHER): Payer: Medicare Other | Admitting: Family Medicine

## 2020-07-04 VITALS — BP 153/72 | HR 58 | Temp 96.9°F | Ht 69.0 in | Wt 169.0 lb

## 2020-07-04 DIAGNOSIS — R1319 Other dysphagia: Secondary | ICD-10-CM

## 2020-07-04 DIAGNOSIS — I10 Essential (primary) hypertension: Secondary | ICD-10-CM | POA: Diagnosis not present

## 2020-07-04 NOTE — Progress Notes (Signed)
Subjective:  Patient ID: Jesus Curtis, male    DOB: December 14, 1940  Age: 80 y.o. MRN: [id number]  CC: Choking   HPI Jesus Curtis presents for an episode of dysphagia in which he felt a lump at the base of his anterior neck after swallowing at his evening meal 2 days ago.  He felt the lump and then subsequently could not swallow anything including water.  This lasted for 24 hours.  It occurred on the evening of March 14 and lasted until the afternoon of March 15.  He denies any pain indigestion heartburn or GI distress.  He did not have any pain from this.  But he just simply could not swallow at all.  This went away on its own at the end of this time..  Patient also was noted to have elevated blood pressure today.  He is followed for hypertension.  He is not checking his blood pressure at home regularly but states that he is prone to white coat syndrome. Depression screen [facility] 2/9 07/04/2020 05/28/2020 08/02/2019  Decreased Interest 0 0 0  Down, Depressed, Hopeless 0 0 0  PHQ - 2 Score 0 0 0  Altered sleeping - - -  Tired, decreased energy - - -  Change in appetite - - -  Feeling bad or failure about yourself  - - -  Trouble concentrating - - -  Moving slowly or fidgety/restless - - -  Suicidal thoughts - - -  PHQ-9 Score - - -    History Jesus Curtis has a past medical history of Abnormal stress test, Aortic atherosclerosis ([place]), Chest pain, Elevated hemoglobin A1c, Elevated troponin, Hypertension, Nausea & vomiting, Snake bite, SVT (supraventricular tachycardia) ([place]), and Syncope.   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   His family history includes Heart attack in his brother, brother, father, and mother; Heart disease in his brother, father, mother, and paternal grandfather; Hypertension in his father, mother, and paternal  grandfather; Stroke in his brother, maternal grandmother, and paternal grandmother.He reports that he has never smoked. He has never used smokeless tobacco. He reports that he does not drink alcohol and does not use drugs.    ROS Review of Systems  Constitutional: Negative for fever.  HENT: Positive for trouble swallowing.   Respiratory: Negative for shortness of breath.   Cardiovascular: Negative for chest pain.  Gastrointestinal: Negative for abdominal distention and abdominal pain.  Musculoskeletal: Negative for arthralgias.  Skin: Negative for rash.    Objective:  BP (!) 153/72   Pulse (!) 58   Temp (!) 96.9 F (36.1 C)   Ht 5' 9" (1.753 m)   Wt 169 lb (76.7 kg)   SpO2 98%   BMI 24.96 kg/m   BP Readings from Last 3 Encounters:  07/04/20 (!) 153/72  05/28/20 135/82  12/02/19 (!) 142/70    Wt Readings from Last 3 Encounters:  07/04/20 169 lb (76.7 kg)  05/28/20 170 lb 9.6 oz (77.4 kg)  12/02/19 170 lb (77.1 kg)     Physical Exam Vitals reviewed.  Constitutional:      Appearance: He is well-developed.  HENT:     Head: Normocephalic and atraumatic.     Right Ear: Tympanic membrane and external ear normal. No decreased hearing noted.     Left Ear: Tympanic membrane and external ear normal. No decreased hearing noted.  Mouth/Throat:     Pharynx: No oropharyngeal exudate or posterior oropharyngeal erythema.     Comments: The patient is currently able to swallow normally.  Palpation of the anterior neck with swallowing shows normal excursion of the Adam's apple.  Normal pulses and no distress. Eyes:     Pupils: Pupils are equal, round, and reactive to light.  Cardiovascular:     Rate and Rhythm: Normal rate and regular rhythm.     Heart sounds: No murmur heard.   Pulmonary:     Effort: No respiratory distress.     Breath sounds: Normal breath sounds.  Abdominal:     General: Bowel sounds are normal.     Palpations: Abdomen is soft. There is no mass.      Tenderness: There is no abdominal tenderness.  Musculoskeletal:     Cervical back: Normal range of motion and neck supple.       Assessment & Plan:   Jesus Curtis was seen today for choking.  Diagnoses and all orders for this visit:  Esophageal dysphagia -     DG SWALLOW FUNC OP MEDICARE SPEECH PATH; Future  Essential hypertension -     DG SWALLOW FUNC OP MEDICARE SPEECH PATH; Future   Chew slowly, small bites.  Swallow fully before masticating a second bite.  In particular he should be careful with meat sources and bread sources.  Some leafy greens can be difficult as well.    I am having Jesus Curtis maintain his Multiple Vitamin (ONE-A-DAY MENS PO), acetaminophen, GINSENG PO, aspirin EC, blood glucose meter kit and supplies, glucose blood, fluticasone, metFORMIN, lisinopril, atorvastatin, amLODipine, metoprolol tartrate, and ipratropium.  Allergies as of 07/04/2020      Reactions   Ativan [lorazepam] Other (See Comments)   "goes wild"      Medication List       Accurate as of July 04, 2020  8:47 PM. If you have any questions, ask your nurse or doctor.        acetaminophen 500 MG tablet Commonly known as: TYLENOL Take 1,000 mg by mouth every 6 (six) hours as needed for mild pain.   amLODipine 10 MG tablet Commonly known as: NORVASC Take 1 tablet (10 mg total) by mouth daily.   aspirin EC 81 MG tablet Take 81 mg by mouth daily.   atorvastatin 80 MG tablet Commonly known as: LIPITOR Take 1 tablet (80 mg total) by mouth daily.   blood glucose meter kit and supplies Kit Dispense based on patient and insurance preference. Use up to four times daily as directed. (FOR ICD-9 250.00, 250.01).   fluticasone 50 MCG/ACT nasal spray Commonly known as: FLONASE Place 1 spray into both nostrils 2 (two) times daily as needed for allergies or rhinitis.   GINSENG PO Take 2 tablets by mouth daily.   glucose blood test strip Commonly known as: ONE TOUCH ULTRA TEST Use to  check blood sugars up to four times a day   ipratropium 0.03 % nasal spray Commonly known as: ATROVENT Place 2 sprays into both nostrils every 12 (twelve) hours for 3 days.   lisinopril 40 MG tablet Commonly known as: ZESTRIL Take 1 tablet (40 mg total) by mouth daily.   metFORMIN 500 MG 24 hr tablet Commonly known as: GLUCOPHAGE-XR TAKE 1 TABLET BY MOUTH  DAILY WITH BREAKFAST   metoprolol tartrate 100 MG tablet Commonly known as: LOPRESSOR TAKE 1 TABLET BY MOUTH  TWICE DAILY   ONE-A-DAY MENS PO Take 1 tablet by  mouth daily.        Follow-up: Return in about 2 weeks (around 07/18/2020), or if symptoms worsen or fail to improve.  Claretta Fraise, M.D.

## 2020-07-05 ENCOUNTER — Telehealth: Payer: Self-pay

## 2020-07-05 NOTE — Telephone Encounter (Signed)
Pt called to let Dr Darlyn Read know that he does not want Dr Darlyn Read to send him to see anyone regarding his swallowing.

## 2020-07-19 ENCOUNTER — Other Ambulatory Visit: Payer: Self-pay | Admitting: Family Medicine

## 2020-07-19 DIAGNOSIS — J00 Acute nasopharyngitis [common cold]: Secondary | ICD-10-CM

## 2020-07-25 DIAGNOSIS — Z7984 Long term (current) use of oral hypoglycemic drugs: Secondary | ICD-10-CM | POA: Diagnosis not present

## 2020-07-25 DIAGNOSIS — H52221 Regular astigmatism, right eye: Secondary | ICD-10-CM | POA: Diagnosis not present

## 2020-07-25 DIAGNOSIS — H5203 Hypermetropia, bilateral: Secondary | ICD-10-CM | POA: Diagnosis not present

## 2020-07-25 DIAGNOSIS — H524 Presbyopia: Secondary | ICD-10-CM | POA: Diagnosis not present

## 2020-07-25 DIAGNOSIS — H2513 Age-related nuclear cataract, bilateral: Secondary | ICD-10-CM | POA: Diagnosis not present

## 2020-07-25 DIAGNOSIS — E119 Type 2 diabetes mellitus without complications: Secondary | ICD-10-CM | POA: Diagnosis not present

## 2020-07-25 LAB — HM DIABETES EYE EXAM

## 2020-11-26 ENCOUNTER — Ambulatory Visit: Payer: Medicare Other | Admitting: Family Medicine

## 2020-11-27 ENCOUNTER — Other Ambulatory Visit: Payer: Self-pay | Admitting: Family Medicine

## 2020-11-27 DIAGNOSIS — I2510 Atherosclerotic heart disease of native coronary artery without angina pectoris: Secondary | ICD-10-CM

## 2020-11-27 DIAGNOSIS — I10 Essential (primary) hypertension: Secondary | ICD-10-CM

## 2020-11-28 ENCOUNTER — Other Ambulatory Visit: Payer: Self-pay

## 2020-11-28 ENCOUNTER — Encounter: Payer: Self-pay | Admitting: Family Medicine

## 2020-11-28 ENCOUNTER — Ambulatory Visit (INDEPENDENT_AMBULATORY_CARE_PROVIDER_SITE_OTHER): Payer: Medicare Other | Admitting: Family Medicine

## 2020-11-28 VITALS — BP 181/79 | HR 52 | Temp 96.8°F | Ht 69.0 in | Wt 170.0 lb

## 2020-11-28 DIAGNOSIS — E785 Hyperlipidemia, unspecified: Secondary | ICD-10-CM

## 2020-11-28 DIAGNOSIS — R079 Chest pain, unspecified: Secondary | ICD-10-CM

## 2020-11-28 DIAGNOSIS — E119 Type 2 diabetes mellitus without complications: Secondary | ICD-10-CM

## 2020-11-28 DIAGNOSIS — I2510 Atherosclerotic heart disease of native coronary artery without angina pectoris: Secondary | ICD-10-CM

## 2020-11-28 DIAGNOSIS — I10 Essential (primary) hypertension: Secondary | ICD-10-CM | POA: Diagnosis not present

## 2020-11-28 DIAGNOSIS — E1169 Type 2 diabetes mellitus with other specified complication: Secondary | ICD-10-CM | POA: Diagnosis not present

## 2020-11-28 LAB — BAYER DCA HB A1C WAIVED: HB A1C (BAYER DCA - WAIVED): 7 % — ABNORMAL HIGH (ref <7.0)

## 2020-11-28 LAB — LIPID PANEL
Chol/HDL Ratio: 2.4 ratio (ref 0.0–5.0)
Cholesterol, Total: 92 mg/dL — ABNORMAL LOW (ref 100–199)
HDL: 38 mg/dL — ABNORMAL LOW (ref >39)
LDL Chol Calc (NIH): 37 mg/dL (ref 0–99)
Triglycerides: 83 mg/dL (ref 0–149)
VLDL Cholesterol Cal: 17 mg/dL (ref 5–40)

## 2020-11-28 LAB — CBC WITH DIFFERENTIAL/PLATELET
Basophils Absolute: 0.1 10*3/uL (ref 0.0–0.2)
Basos: 1 %
EOS (ABSOLUTE): 0.3 10*3/uL (ref 0.0–0.4)
Eos: 4 %
Hematocrit: 42.8 % (ref 37.5–51.0)
Hemoglobin: 14.4 g/dL (ref 13.0–17.7)
Immature Grans (Abs): 0 10*3/uL (ref 0.0–0.1)
Immature Granulocytes: 0 %
Lymphocytes Absolute: 3.2 10*3/uL — ABNORMAL HIGH (ref 0.7–3.1)
Lymphs: 43 %
MCH: 30.6 pg (ref 26.6–33.0)
MCHC: 33.6 g/dL (ref 31.5–35.7)
MCV: 91 fL (ref 79–97)
Monocytes Absolute: 0.6 10*3/uL (ref 0.1–0.9)
Monocytes: 8 %
Neutrophils Absolute: 3.2 10*3/uL (ref 1.4–7.0)
Neutrophils: 44 %
Platelets: 229 10*3/uL (ref 150–450)
RBC: 4.7 x10E6/uL (ref 4.14–5.80)
RDW: 13.3 % (ref 11.6–15.4)
WBC: 7.4 10*3/uL (ref 3.4–10.8)

## 2020-11-28 LAB — CMP14+EGFR
ALT: 21 IU/L (ref 0–44)
AST: 25 IU/L (ref 0–40)
Albumin/Globulin Ratio: 1.9 (ref 1.2–2.2)
Albumin: 4.5 g/dL (ref 3.7–4.7)
Alkaline Phosphatase: 64 IU/L (ref 44–121)
BUN/Creatinine Ratio: 10 (ref 10–24)
BUN: 9 mg/dL (ref 8–27)
Bilirubin Total: 0.5 mg/dL (ref 0.0–1.2)
CO2: 25 mmol/L (ref 20–29)
Calcium: 10.7 mg/dL — ABNORMAL HIGH (ref 8.6–10.2)
Chloride: 103 mmol/L (ref 96–106)
Creatinine, Ser: 0.88 mg/dL (ref 0.76–1.27)
Globulin, Total: 2.4 g/dL (ref 1.5–4.5)
Glucose: 138 mg/dL — ABNORMAL HIGH (ref 65–99)
Potassium: 5.4 mmol/L — ABNORMAL HIGH (ref 3.5–5.2)
Sodium: 141 mmol/L (ref 134–144)
Total Protein: 6.9 g/dL (ref 6.0–8.5)
eGFR: 88 mL/min/{1.73_m2} (ref >59)

## 2020-11-28 MED ORDER — CHLORTHALIDONE 25 MG PO TABS: 25.0000 mg | ORAL_TABLET | Freq: Every morning | ORAL | 1 refills | Status: DC

## 2020-11-28 NOTE — Progress Notes (Signed)
Subjective:  Patient ID: Jesus Curtis, male    DOB: 22-Feb-1941  Age: 80 y.o. MRN: [id number]  CC: Medical Management of Chronic Issues   HPI Jesus Curtis presents for  follow-up of hypertension. Patient has no history of headache  or shortness of breath or recent cough. Patient also denies symptoms of TIA such as focal numbness or weakness. Patient denies side effects from medication. States taking it regularly.  presents forFollow-up of diabetes. Patient checks blood sugar at home.   130 most days fasting. Patient denies symptoms such as polyuria, polydipsia, excessive hunger, nausea No significant hypoglycemic spells noted. Medications reviewed. Pt reports taking them regularly without complication/adverse reaction being reported today.    in for follow-up of elevated cholesterol. Doing well without complaints on current medication. Denies side effects of statin including myalgia and arthralgia and nausea. Currently no chest pain, shortness of breath or other cardiovascular related symptoms noted.   Having indigestion frequently. Daily. Relieved with TUMS. Subsernal location. Occurs with activity. Lasts 20 minutes. No radiation. No dyspnea, or diaphoresis.   History Jesus Curtis has a past medical history of Abnormal stress test, Aortic atherosclerosis ([place]), Chest pain, Elevated hemoglobin A1c, Elevated troponin, Hypertension, Nausea & vomiting, Snake bite, SVT (supraventricular tachycardia) ([place]), and Syncope.   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   His family history includes Heart attack in his brother, brother, father, and mother; Heart disease in his brother, father, mother, and paternal grandfather; Hypertension in his father, mother, and paternal grandfather; Stroke in his brother, maternal grandmother, and paternal  grandmother.He reports that he has never smoked. He has never used smokeless tobacco. He reports that he does not drink alcohol and does not use drugs.  Current Outpatient Medications on File Prior to Visit  Medication Sig Dispense Refill   acetaminophen (TYLENOL) 500 MG tablet Take 1,000 mg by mouth every 6 (six) hours as needed for mild pain.     aspirin EC 81 MG tablet Take 81 mg by mouth daily.     atorvastatin (LIPITOR) 80 MG tablet Take 1 tablet (80 mg total) by mouth daily. 90 tablet 3   blood glucose meter kit and supplies KIT Dispense based on patient and insurance preference. Use up to four times daily as directed. (FOR ICD-9 250.00, 250.01). 1 each 11   fluticasone (FLONASE) 50 MCG/ACT nasal spray Place 1 spray into both nostrils 2 (two) times daily as needed for allergies or rhinitis. 16 g 6   GINSENG PO Take 2 tablets by mouth daily.     glucose blood (ONE TOUCH ULTRA TEST) test strip Use to check blood sugars up to four times a day 400 each 1   lisinopril (ZESTRIL) 40 MG tablet Take 1 tablet (40 mg total) by mouth daily. 90 tablet 3   metFORMIN (GLUCOPHAGE-XR) 500 MG 24 hr tablet TAKE 1 TABLET BY MOUTH  DAILY WITH BREAKFAST 90 tablet 3   metoprolol tartrate (LOPRESSOR) 100 MG tablet TAKE 1 TABLET BY MOUTH  TWICE DAILY 180 tablet 0   Multiple Vitamin (ONE-A-DAY MENS PO) Take 1 tablet by mouth daily.     amLODipine (NORVASC) 10 MG tablet Take 1 tablet (10 mg total) by mouth daily. 90 tablet 3   ipratropium (ATROVENT) 0.03 % nasal spray PLACE 2 SPRAYS INTO BOTH NOSTRILS EVERY 12 (TWELVE) HOURS FOR 3 DAYS. 30 mL 0  No current facility-administered medications on file prior to visit.    ROS Review of Systems  Constitutional:  Negative for fever.  Respiratory:  Negative for shortness of breath.   Cardiovascular:  Negative for chest pain.  Musculoskeletal:  Negative for arthralgias.  Skin:  Negative for rash.   Objective:  BP (!) 181/79   Pulse (!) 52   Temp (!) 96.8 F (36  C)   Ht _0  (1.753 m)   Wt 170 lb (77.1 kg)   SpO2 98%   BMI 25.10 kg/m   BP Readings from Last 3 Encounters:  11/28/20 (!) 181/79  07/04/20 (!) 153/72  05/28/20 135/82    Wt Readings from Last 3 Encounters:  11/28/20 170 lb (77.1 kg)  07/04/20 169 lb (76.7 kg)  05/28/20 170 lb 9.6 oz (77.4 kg)     Physical Exam Constitutional:      General: He is not in acute distress.    Appearance: He is well-developed.  HENT:     Head: Normocephalic and atraumatic.     Right Ear: External ear normal.     Left Ear: External ear normal.     Nose: Nose normal.  Eyes:     Conjunctiva/sclera: Conjunctivae normal.     Pupils: Pupils are equal, round, and reactive to light.  Cardiovascular:     Rate and Rhythm: Normal rate and regular rhythm.     Heart sounds: Normal heart sounds. No murmur heard. Pulmonary:     Effort: Pulmonary effort is normal. No respiratory distress.     Breath sounds: Normal breath sounds. No wheezing or rales.  Abdominal:     Palpations: Abdomen is soft.     Tenderness: There is no abdominal tenderness.  Musculoskeletal:        General: Normal range of motion.     Cervical back: Normal range of motion and neck supple.  Skin:    General: Skin is warm and dry.  Neurological:     Mental Status: He is alert and oriented to person, place, and time.     Deep Tendon Reflexes: Reflexes are normal and symmetric.  Psychiatric:        Behavior: Behavior normal.        Thought Content: Thought content normal.        Judgment: Judgment normal.     EKG - RBBB, unchanged from 12/02/2019 Assessment & Plan:   Jesus Curtis was seen today for medical management of chronic issues.  Diagnoses and all orders for this visit:  Essential hypertension -     CMP14+EGFR  Type 2 diabetes mellitus without complication, without long-term current use of insulin (HCC) -     Bayer DCA Hb A1c Waived -     CBC with Differential/Platelet  Hyperlipidemia associated with type 2 diabetes  mellitus (HCC) -     Lipid panel  Chest pain, unspecified type -     EKG 12-Lead  ASCVD (arteriosclerotic cardiovascular disease)  Other orders -     chlorthalidone (HYGROTON) 25 MG tablet; Take 1 tablet (25 mg total) by mouth every morning. For blood pressure  Allergies as of 11/28/2020       Reactions   Ativan [lorazepam] Other (See Comments)   "goes wild"        Medication List        Accurate as of November 28, 2020 11:59 PM. If you have any questions, ask your nurse or doctor.          acetaminophen  500 MG tablet Commonly known as: TYLENOL Take 1,000 mg by mouth every 6 (six) hours as needed for mild pain.   amLODipine 10 MG tablet Commonly known as: NORVASC Take 1 tablet (10 mg total) by mouth daily.   aspirin EC 81 MG tablet Take 81 mg by mouth daily.   atorvastatin 80 MG tablet Commonly known as: LIPITOR Take 1 tablet (80 mg total) by mouth daily.   blood glucose meter kit and supplies Kit Dispense based on patient and insurance preference. Use up to four times daily as directed. (FOR ICD-9 250.00, 250.01).   chlorthalidone 25 MG tablet Commonly known as: HYGROTON Take 1 tablet (25 mg total) by mouth every morning. For blood pressure Started by: Claretta Fraise, MD   fluticasone 50 MCG/ACT nasal spray Commonly known as: FLONASE Place 1 spray into both nostrils 2 (two) times daily as needed for allergies or rhinitis.   GINSENG PO Take 2 tablets by mouth daily.   glucose blood test strip Commonly known as: ONE TOUCH ULTRA TEST Use to check blood sugars up to four times a day   ipratropium 0.03 % nasal spray Commonly known as: ATROVENT PLACE 2 SPRAYS INTO BOTH NOSTRILS EVERY 12 (TWELVE) HOURS FOR 3 DAYS.   lisinopril 40 MG tablet Commonly known as: ZESTRIL Take 1 tablet (40 mg total) by mouth daily.   metFORMIN 500 MG 24 hr tablet Commonly known as: GLUCOPHAGE-XR TAKE 1 TABLET BY MOUTH  DAILY WITH BREAKFAST   metoprolol tartrate 100 MG  tablet Commonly known as: LOPRESSOR TAKE 1 TABLET BY MOUTH  TWICE DAILY   ONE-A-DAY MENS PO Take 1 tablet by mouth daily.        Meds ordered this encounter  Medications   chlorthalidone (HYGROTON) 25 MG tablet    Sig: Take 1 tablet (25 mg total) by mouth every morning. For blood pressure    Dispense:  90 tablet    Refill:  1      Follow-up: Return in about 3 months (around 02/28/2021).  Claretta Fraise, M.D.

## 2020-12-03 ENCOUNTER — Encounter: Payer: Self-pay | Admitting: Family Medicine

## 2020-12-03 NOTE — Progress Notes (Signed)
Hello Daisy,  Your lab result is normal and/or stable.Some minor variations that are not significant are commonly marked abnormal, but do not represent any medical problem for you.  Best regards, Tima Curet, M.D.

## 2021-01-03 ENCOUNTER — Ambulatory Visit (INDEPENDENT_AMBULATORY_CARE_PROVIDER_SITE_OTHER): Payer: Medicare Other | Admitting: Family Medicine

## 2021-01-03 ENCOUNTER — Encounter: Payer: Self-pay | Admitting: Cardiology

## 2021-01-03 ENCOUNTER — Ambulatory Visit: Payer: Medicare Other | Admitting: Cardiology

## 2021-01-03 ENCOUNTER — Other Ambulatory Visit: Payer: Self-pay

## 2021-01-03 ENCOUNTER — Encounter: Payer: Self-pay | Admitting: Family Medicine

## 2021-01-03 VITALS — BP 140/78 | HR 58 | Ht 69.0 in | Wt 172.6 lb

## 2021-01-03 VITALS — BP 140/74 | HR 57 | Temp 97.1°F | Ht 69.0 in | Wt 170.8 lb

## 2021-01-03 DIAGNOSIS — I10 Essential (primary) hypertension: Secondary | ICD-10-CM

## 2021-01-03 DIAGNOSIS — I2510 Atherosclerotic heart disease of native coronary artery without angina pectoris: Secondary | ICD-10-CM

## 2021-01-03 DIAGNOSIS — E782 Mixed hyperlipidemia: Secondary | ICD-10-CM | POA: Diagnosis not present

## 2021-01-03 DIAGNOSIS — K2100 Gastro-esophageal reflux disease with esophagitis, without bleeding: Secondary | ICD-10-CM | POA: Diagnosis not present

## 2021-01-03 DIAGNOSIS — E119 Type 2 diabetes mellitus without complications: Secondary | ICD-10-CM

## 2021-01-03 MED ORDER — PANTOPRAZOLE SODIUM 40 MG PO TBEC: 40.0000 mg | DELAYED_RELEASE_TABLET | Freq: Every day | ORAL | 3 refills | Status: DC

## 2021-01-03 NOTE — Progress Notes (Signed)
Clinical Summary Mr. Vos is a 80 y.o.male seen for the following medical problems.    1. CAD - 11/2016 cath with severe multivessel disease, referred for CABG - 11/24/16 CABG x4 (left internal mammary artery to     left anterior descending, saphenous vein graft to diagonal,     saphenous vein graft to circumflex marginal, saphenous vein graft     to posterior descending Avriel Kandel of the distal circumflex) - 11/2016 echo LVEF 55-60%, grade II diastolic dysfunction       - no chest pain, no SOB/DOE - compliant with meds     2. HTN - on chlorthalidoen had orthoststic dizzziness, stopped taking - home bp's 140s/70s      3. Hyperlipidemia  07/2019 TC 91 TG 49 HDL 48 LDL 41 11/2020 TC 92 TG 83 HDL 38 LDL 37 - he is on atorva 65m daily   4. DM2 - followed by pcp   Past Medical History:  Diagnosis Date   Abnormal stress test    Aortic atherosclerosis (HCC)    Chest pain    Elevated hemoglobin A1c    Elevated troponin    Hypertension    Nausea & vomiting    Snake bite    AS A CHILD HAD PMH   SVT (supraventricular tachycardia) (HCC)    Syncope      Allergies  Allergen Reactions   Ativan [Lorazepam] Other (See Comments)    "goes wild"     Current Outpatient Medications  Medication Sig Dispense Refill   acetaminophen (TYLENOL) 500 MG tablet Take 1,000 mg by mouth every 6 (six) hours as needed for mild pain.     amLODipine (NORVASC) 10 MG tablet Take 1 tablet (10 mg total) by mouth daily. 90 tablet 3   aspirin EC 81 MG tablet Take 81 mg by mouth daily.     atorvastatin (LIPITOR) 80 MG tablet Take 1 tablet (80 mg total) by mouth daily. 90 tablet 3   blood glucose meter kit and supplies KIT Dispense based on patient and insurance preference. Use up to four times daily as directed. (FOR ICD-9 250.00, 250.01). 1 each 11   chlorthalidone (HYGROTON) 25 MG tablet Take 1 tablet (25 mg total) by mouth every morning. For blood pressure 90 tablet 1   fluticasone (FLONASE) 50  MCG/ACT nasal spray Place 1 spray into both nostrils 2 (two) times daily as needed for allergies or rhinitis. 16 g 6   GINSENG PO Take 2 tablets by mouth daily.     glucose blood (ONE TOUCH ULTRA TEST) test strip Use to check blood sugars up to four times a day 400 each 1   ipratropium (ATROVENT) 0.03 % nasal spray PLACE 2 SPRAYS INTO BOTH NOSTRILS EVERY 12 (TWELVE) HOURS FOR 3 DAYS. 30 mL 0   lisinopril (ZESTRIL) 40 MG tablet Take 1 tablet (40 mg total) by mouth daily. 90 tablet 3   metFORMIN (GLUCOPHAGE-XR) 500 MG 24 hr tablet TAKE 1 TABLET BY MOUTH  DAILY WITH BREAKFAST 90 tablet 3   metoprolol tartrate (LOPRESSOR) 100 MG tablet TAKE 1 TABLET BY MOUTH  TWICE DAILY 180 tablet 0   Multiple Vitamin (ONE-A-DAY MENS PO) Take 1 tablet by mouth daily.     pantoprazole (PROTONIX) 40 MG tablet Take 1 tablet (40 mg total) by mouth daily. For stomach 90 tablet 3   No current facility-administered medications for this visit.     Past Surgical History:  Procedure Laterality Date   CORONARY ARTERY  BYPASS GRAFT N/A 11/24/2016   Procedure: CORONARY ARTERY BYPASS GRAFTING (CABG), ON PUMP, TIMES FOUR, USING LEFT INTERNAL MAMMARY ARTERY AND ENDOSCOPICALLY HARVESTED RIGHT GREATER SAPHENOUS VEIN;  Surgeon: Ivin Poot, MD;  Location: [facility];  Service: Open Heart Surgery;  Laterality: N/A;  LIMA to LAD SVG to PDA SVG to OM1 SVG to [place] CATH AND CORONARY ANGIOGRAPHY N/A 11/21/2016   Procedure: LEFT HEART CATH AND CORONARY ANGIOGRAPHY;  Surgeon: Troy Sine, MD;  Location: [facility];  Service: Cardiovascular;  Laterality: N/A;   TEE WITHOUT CARDIOVERSION N/A 11/24/2016   Procedure: TRANSESOPHAGEAL ECHOCARDIOGRAM (TEE);  Surgeon: Prescott Gum, Collier Salina, MD;  Location: [facility];  Service: Open Heart Surgery;  Laterality: N/A;     Allergies  Allergen Reactions   Ativan [Lorazepam] Other (See Comments)    "goes wild"      Family History  Problem Relation Age of Onset   Heart attack  Mother    Heart disease Mother    Hypertension Mother    Heart attack Father    Heart disease Father    Hypertension Father    Heart attack Brother    Heart disease Brother    Stroke Brother    Stroke Maternal Grandmother    Stroke Paternal Grandmother    Heart disease Paternal Grandfather    Hypertension Paternal Grandfather    Heart attack Brother      Social History Mr. Ruhe reports that he has never smoked. He has never used smokeless tobacco. Mr. Bala reports no history of alcohol use.   Review of Systems CONSTITUTIONAL: No weight loss, fever, chills, weakness or fatigue.  HEENT: Eyes: No visual loss, blurred vision, double vision or yellow sclerae.No hearing loss, sneezing, congestion, runny nose or sore throat.  SKIN: No rash or itching.  CARDIOVASCULAR: per hpi RESPIRATORY: No shortness of breath, cough or sputum.  GASTROINTESTINAL: No anorexia, nausea, vomiting or diarrhea. No abdominal pain or blood.  GENITOURINARY: No burning on urination, no polyuria NEUROLOGICAL: No headache, dizziness, syncope, paralysis, ataxia, numbness or tingling in the extremities. No change in bowel or bladder control.  MUSCULOSKELETAL: No muscle, back pain, joint pain or stiffness.  LYMPHATICS: No enlarged nodes. No history of splenectomy.  PSYCHIATRIC: No history of depression or anxiety.  ENDOCRINOLOGIC: No reports of sweating, cold or heat intolerance. No polyuria or polydipsia.  Marland Kitchen   Physical Examination Today's Vitals   01/03/21 1539  BP: 140/78  Pulse: (!) 58  SpO2: 98%  Weight: 172 lb 9.6 oz (78.3 kg)  Height: '5\' 9"'  (1.753 m)   Body mass index is 25.49 kg/m.  Gen: resting comfortably, no acute distress HEENT: no scleral icterus, pupils equal round and reactive, no palptable cervical adenopathy,  CV: RRR, no m/r/g no jvd Resp: Clear to auscultation bilaterally GI: abdomen is soft, non-tender, non-distended, normal bowel sounds, no hepatosplenomegaly MSK:  extremities are warm, no edema.  Skin: warm, no rash Neuro:  no focal deficits Psych: appropriate affect   Diagnostic Studies  11/2016 cath   LM lesion, 30 %stenosed. Ost LAD to Prox LAD lesion, 30 %stenosed. Mid LAD lesion, 90 %stenosed. 1st Mrg lesion, 50 %stenosed. Ost Cx to Prox Cx lesion, 70 %stenosed. Prox Cx to Dist Cx lesion, 70 %stenosed. Ost RCA to Prox RCA lesion, 80 %stenosed. Mid RCA lesion, 100 %stenosed. LV end diastolic pressure is normal.     11/2016 echo     - Left ventricle: The cavity size was normal. Systolic function  was   normal. The estimated ejection fraction was in the range of 55%   to 60%. Wall motion was normal; there were no regional wall   motion abnormalities. Features are consistent with a pseudonormal   left ventricular filling pattern, with concomitant abnormal   relaxation and increased filling pressure (grade 2 diastolic   dysfunction). Doppler parameters are consistent with elevated   mean left atrial filling pressure. - Left atrium: The atrium was mildly dilated. - Pericardium, extracardiac: A trivial pericardial effusion was   identified.     11/2016 carotid US Findings suggest 1-39% internal carotid artery stenosis bilaterally. Vertebral arteries are patent with antegrade flow.   Assessment and Plan  1. CAD - no symptoms, continue current meds   2. HTN -significant orthostatic symptoms with trying chlorthladione last visit, now off - given advanced age and orthostatic symptoms with prior attempts at more aggressive control would accept high bp target for him, bp's today remain reasonable.    3.Hyperlipidemia - lipids at goal, continue atorvastatin.    4. DM2 -for cardiovascular protection continue statin and ACEi   F/u 1 year   Arnoldo Lenis, M.D.

## 2021-01-03 NOTE — Progress Notes (Signed)
Subjective:  Patient ID: Jesus Curtis, male    DOB: [birth year]  Age: 80 y.o. MRN: [id number]  CC: Follow-up   HPI Jesus Curtis presents for  follow-up of hypertension. Patient has no history of headache chest pain or shortness of breath or recent cough. Patient also denies symptoms of TIA such as focal numbness or weakness. Patient denies side effects from medication. States taking it regularly.Jesus Curtis started avoiding bacon and other salty things. SBP at home was 120 last night.  Still having reflux daily. Requests treatment. Sx are moderate. Substernal burning only. No melena or hematochezia. Denies nausea and vomiting   History Jesus Curtis has a past medical history of Abnormal stress test, Aortic atherosclerosis ([place]), Chest pain, Elevated hemoglobin A1c, Elevated troponin, Hypertension, Nausea & vomiting, Snake bite, SVT (supraventricular tachycardia) ([place]), and Syncope.   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   His family history includes Heart attack in his brother, brother, father, and mother; Heart disease in his brother, father, mother, and paternal grandfather; Hypertension in his father, mother, and paternal grandfather; Stroke in his brother, maternal grandmother, and paternal grandmother.Jesus Curtis reports that Jesus Curtis has never smoked. Jesus Curtis has never used smokeless tobacco. Jesus Curtis reports that Jesus Curtis does not drink alcohol and does not use drugs.  Current Outpatient Medications on File Prior to Visit  Medication Sig Dispense Refill   acetaminophen (TYLENOL) 500 MG tablet Take 1,000 mg by mouth every 6 (six) hours as needed for mild pain.     aspirin EC 81 MG tablet Take 81 mg by mouth daily.     atorvastatin (LIPITOR) 80 MG tablet Take 1 tablet (80 mg total) by mouth daily. 90 tablet 3   blood glucose meter kit and supplies KIT Dispense based  on patient and insurance preference. Use up to four times daily as directed. (FOR ICD-9 250.00, 250.01). 1 each 11   fluticasone (FLONASE) 50 MCG/ACT nasal spray Place 1 spray into both nostrils 2 (two) times daily as needed for allergies or rhinitis. 16 g 6   GINSENG PO Take 2 tablets by mouth daily.     glucose blood (ONE TOUCH ULTRA TEST) test strip Use to check blood sugars up to four times a day 400 each 1   lisinopril (ZESTRIL) 40 MG tablet Take 1 tablet (40 mg total) by mouth daily. 90 tablet 3   metFORMIN (GLUCOPHAGE-XR) 500 MG 24 hr tablet TAKE 1 TABLET BY MOUTH  DAILY WITH BREAKFAST 90 tablet 3   metoprolol tartrate (LOPRESSOR) 100 MG tablet TAKE 1 TABLET BY MOUTH  TWICE DAILY 180 tablet 0   Multiple Vitamin (ONE-A-DAY MENS PO) Take 1 tablet by mouth daily.     amLODipine (NORVASC) 10 MG tablet Take 1 tablet (10 mg total) by mouth daily. 90 tablet 3   ipratropium (ATROVENT) 0.03 % nasal spray PLACE 2 SPRAYS INTO BOTH NOSTRILS EVERY 12 (TWELVE) HOURS FOR 3 DAYS. 30 mL 0   No current facility-administered medications on file prior to visit.    ROS Review of Systems  Constitutional:  Negative for fever.  Respiratory:  Negative for shortness of breath.   Cardiovascular:  Negative for chest pain.  Gastrointestinal:  Positive for abdominal pain.  Musculoskeletal:  Negative for arthralgias.  Skin:  Negative for rash.   Objective:  BP 140/74   Pulse (!) 57   Temp (!) 97.1 F (36.2 C)  Ht '5\' 9"'  (1.753 m)   Wt 170 lb 12.8 oz (77.5 kg)   SpO2 96%   BMI 25.22 kg/m   BP Readings from Last 3 Encounters:  01/03/21 140/78  01/03/21 140/74  11/28/20 (!) 181/79    Wt Readings from Last 3 Encounters:  01/03/21 172 lb 9.6 oz (78.3 kg)  01/03/21 170 lb 12.8 oz (77.5 kg)  11/28/20 170 lb (77.1 kg)     Physical Exam Constitutional:      General: Jesus Curtis is not in acute distress.    Appearance: Jesus Curtis is well-developed.  HENT:     Head: Normocephalic and atraumatic.     Right Ear:  External ear normal.     Left Ear: External ear normal.     Nose: Nose normal.  Eyes:     Conjunctiva/sclera: Conjunctivae normal.     Pupils: Pupils are equal, round, and reactive to light.  Cardiovascular:     Rate and Rhythm: Normal rate and regular rhythm.     Heart sounds: Normal heart sounds. No murmur heard. Pulmonary:     Effort: Pulmonary effort is normal. No respiratory distress.     Breath sounds: Normal breath sounds. No wheezing or rales.  Abdominal:     Palpations: Abdomen is soft.     Tenderness: There is no abdominal tenderness.  Musculoskeletal:        General: Normal range of motion.     Cervical back: Normal range of motion and neck supple.  Skin:    General: Skin is warm and dry.  Neurological:     Mental Status: Jesus Curtis is alert and oriented to person, place, and time.     Deep Tendon Reflexes: Reflexes are normal and symmetric.  Psychiatric:        Behavior: Behavior normal.        Thought Content: Thought content normal.        Judgment: Judgment normal.      Assessment & Plan:   Jesus Curtis was seen today for follow-up.  Diagnoses and all orders for this visit:  Gastroesophageal reflux disease with esophagitis without hemorrhage  Essential hypertension  Other orders -     pantoprazole (PROTONIX) 40 MG tablet; Take 1 tablet (40 mg total) by mouth daily. For stomach  Allergies as of 01/03/2021       Reactions   Ativan [lorazepam] Other (See Comments)   "goes wild"        Medication List        Accurate as of January 03, 2021 11:59 PM. If you have any questions, ask your nurse or doctor.          STOP taking these medications    chlorthalidone 25 MG tablet Commonly known as: HYGROTON Stopped by: Carlyle Dolly, MD       TAKE these medications    acetaminophen 500 MG tablet Commonly known as: TYLENOL Take 1,000 mg by mouth every 6 (six) hours as needed for mild pain.   amLODipine 10 MG tablet Commonly known as: NORVASC Take 1  tablet (10 mg total) by mouth daily.   aspirin EC 81 MG tablet Take 81 mg by mouth daily.   atorvastatin 80 MG tablet Commonly known as: LIPITOR Take 1 tablet (80 mg total) by mouth daily.   blood glucose meter kit and supplies Kit Dispense based on patient and insurance preference. Use up to four times daily as directed. (FOR ICD-9 250.00, 250.01).   fluticasone 50 MCG/ACT nasal spray Commonly known as: FLONASE  Place 1 spray into both nostrils 2 (two) times daily as needed for allergies or rhinitis.   GINSENG PO Take 2 tablets by mouth daily.   glucose blood test strip Commonly known as: ONE TOUCH ULTRA TEST Use to check blood sugars up to four times a day   ipratropium 0.03 % nasal spray Commonly known as: ATROVENT PLACE 2 SPRAYS INTO BOTH NOSTRILS EVERY 12 (TWELVE) HOURS FOR 3 DAYS.   lisinopril 40 MG tablet Commonly known as: ZESTRIL Take 1 tablet (40 mg total) by mouth daily.   metFORMIN 500 MG 24 hr tablet Commonly known as: GLUCOPHAGE-XR TAKE 1 TABLET BY MOUTH  DAILY WITH BREAKFAST   metoprolol tartrate 100 MG tablet Commonly known as: LOPRESSOR TAKE 1 TABLET BY MOUTH  TWICE DAILY   ONE-A-DAY MENS PO Take 1 tablet by mouth daily.   pantoprazole 40 MG tablet Commonly known as: PROTONIX Take 1 tablet (40 mg total) by mouth daily. For stomach Started by: Claretta Fraise, MD        Meds ordered this encounter  Medications   pantoprazole (PROTONIX) 40 MG tablet    Sig: Take 1 tablet (40 mg total) by mouth daily. For stomach    Dispense:  90 tablet    Refill:  3      Follow-up: Return in about 3 months (around 04/04/2021).  Claretta Fraise, M.D.

## 2021-01-03 NOTE — Patient Instructions (Signed)
Medication Instructions:  Continue all current medications.  Labwork: none  Testing/Procedures: none  Follow-Up: 1 year   Any Other Special Instructions Will Be Listed Below (If Applicable).  If you need a refill on your cardiac medications before your next appointment, please call your pharmacy.  

## 2021-01-16 ENCOUNTER — Ambulatory Visit (INDEPENDENT_AMBULATORY_CARE_PROVIDER_SITE_OTHER): Payer: Medicare Other | Admitting: Nurse Practitioner

## 2021-01-16 ENCOUNTER — Encounter: Payer: Self-pay | Admitting: Nurse Practitioner

## 2021-01-16 DIAGNOSIS — Z91199 Patient's noncompliance with other medical treatment and regimen due to unspecified reason: Secondary | ICD-10-CM | POA: Insufficient documentation

## 2021-01-16 DIAGNOSIS — Z5329 Procedure and treatment not carried out because of patient's decision for other reasons: Secondary | ICD-10-CM

## 2021-01-16 NOTE — Progress Notes (Signed)
   Virtual Visit  Note Due to COVID-19 pandemic this visit was conducted virtually. This visit type was conducted due to national recommendations for restrictions regarding the COVID-19 Pandemic (e.g. social distancing, sheltering in place) in an effort to limit this patient's exposure and mitigate transmission in our community. All issues noted in this document were discussed and addressed.  A physical exam was not performed with this format.  I connected with Jesus Curtis on 01/16/21 at 9:43 AM by telephone and verified that I am speaking with the correct person using two identifiers. Jesus Curtis is currently located at home and family member was with patient during visit. The provider, Daryll Drown, NP is located in their office at time of visit.  I discussed the limitations, risks, security and privacy concerns of performing an evaluation and management service by telephone and the availability of in person appointments. I also discussed with the patient that there may be a patient responsible charge related to this service. The patient expressed understanding and agreed to proceed.   History and Present Illness:  HPI  I called patient for phone visit.  Patient verified birthday. I Asked patient what symptoms are you having?.  Family members voice instructed patient to ask for antibiotic.  Patient says" I have a cold and I need an antibiotic."  I asked patient what symptoms he is having?  Patient responded if you are not going to give me an antibiotic than I do not need to be speaking with you then" Hung up the phone. visit ended    Review of Systems  Reason unable to perform ROS: Patient not willing to continue visit.    Observations/Objective: Televisit patient not in distress  Assessment and Plan: Call ended  Follow Up Instructions: No follow-up    I discussed the assessment and treatment plan with the patient. The patient was provided an opportunity to ask  questions and all were answered. The patient agreed with the plan and demonstrated an understanding of the instructions.   The patient was advised to call back or seek an in-person evaluation if the symptoms worsen or if the condition fails to improve as anticipated.  The above assessment and management plan was discussed with the patient. The patient verbalized understanding of and has agreed to the management plan. Patient is aware to call the clinic if symptoms persist or worsen. Patient is aware when to return to the clinic for a follow-up visit. Patient educated on when it is appropriate to go to the emergency department.   Time call ended: 9:48 AM  I provided 5 minutes of  non face-to-face time during this encounter.    Daryll Drown, NP

## 2021-01-17 ENCOUNTER — Encounter: Payer: Self-pay | Admitting: Family Medicine

## 2021-01-17 ENCOUNTER — Ambulatory Visit (INDEPENDENT_AMBULATORY_CARE_PROVIDER_SITE_OTHER): Payer: Medicare Other | Admitting: Family Medicine

## 2021-01-17 DIAGNOSIS — J329 Chronic sinusitis, unspecified: Secondary | ICD-10-CM

## 2021-01-17 DIAGNOSIS — J40 Bronchitis, not specified as acute or chronic: Secondary | ICD-10-CM

## 2021-01-17 MED ORDER — PSEUDOEPHEDRINE-GUAIFENESIN ER 60-600 MG PO TB12: 1.0000 | ORAL_TABLET | Freq: Two times a day (BID) | ORAL | 0 refills | Status: AC

## 2021-01-17 MED ORDER — AMOXICILLIN-POT CLAVULANATE 875-125 MG PO TABS: 1.0000 | ORAL_TABLET | Freq: Two times a day (BID) | ORAL | 0 refills | Status: DC

## 2021-01-17 NOTE — Progress Notes (Signed)
Subjective:    Patient ID: Jesus Curtis, male    DOB: Sep 11, 1940, 80 y.o.   MRN: [id number]   HPI: Jesus Curtis is a 80 y.o. male presenting for cold and cough. Runny nose, fever and congestion. ONset one week ago. Denies dyspnea. Cough is dry. No covid test done. Says he doesn't feel like it's covid. Declines Covid testing.    Depression screen [facility] 2/9 01/03/2021 11/28/2020 07/04/2020 05/28/2020 08/02/2019  Decreased Interest 0 0 0 0 0  Down, Depressed, Hopeless 0 0 0 0 0  PHQ - 2 Score 0 0 0 0 0  Altered sleeping - - - - -  Tired, decreased energy - - - - -  Change in appetite - - - - -  Feeling bad or failure about yourself  - - - - -  Trouble concentrating - - - - -  Moving slowly or fidgety/restless - - - - -  Suicidal thoughts - - - - -  PHQ-9 Score - - - - -     Relevant past medical, surgical, family and social history reviewed and updated as indicated.  Interim medical history since our last visit reviewed. Allergies and medications reviewed and updated.  ROS:  Review of Systems  Constitutional:  Negative for activity change, appetite change, chills and fever.  HENT:  Positive for congestion, postnasal drip, rhinorrhea and sinus pressure. Negative for ear discharge, ear pain, hearing loss, nosebleeds, sneezing and trouble swallowing.   Respiratory:  Negative for chest tightness and shortness of breath.   Cardiovascular:  Negative for chest pain and palpitations.  Skin:  Negative for rash.    Social History   Tobacco Use  Smoking Status Never  Smokeless Tobacco Never       Objective:     Wt Readings from Last 3 Encounters:  01/03/21 172 lb 9.6 oz (78.3 kg)  01/03/21 170 lb 12.8 oz (77.5 kg)  11/28/20 170 lb (77.1 kg)     Exam deferred. Pt. Harboring due to COVID 19. Phone visit performed.   Assessment & Plan:   1. Sinobronchitis     Meds ordered this encounter  Medications   amoxicillin-clavulanate (AUGMENTIN) 875-125 MG tablet    Sig:  Take 1 tablet by mouth 2 (two) times daily. Take all of this medication    Dispense:  20 tablet    Refill:  0   pseudoephedrine-guaifenesin (MUCINEX D) 60-600 MG 12 hr tablet    Sig: Take 1 tablet by mouth every 12 (twelve) hours for 14 days. As needed for congestion    Dispense:  20 tablet    Refill:  0    No orders of the defined types were placed in this encounter.     Diagnoses and all orders for this visit:  Sinobronchitis  Other orders -     amoxicillin-clavulanate (AUGMENTIN) 875-125 MG tablet; Take 1 tablet by mouth 2 (two) times daily. Take all of this medication -     pseudoephedrine-guaifenesin (MUCINEX D) 60-600 MG 12 hr tablet; Take 1 tablet by mouth every 12 (twelve) hours for 14 days. As needed for congestion   Virtual Visit via telephone Note  I discussed the limitations, risks, security and privacy concerns of performing an evaluation and management service by telephone and the availability of in person appointments. The patient was identified with two identifiers. Pt.expressed understanding and agreed to proceed. Pt. Is at home. Dr. Darlyn Read is in his office.  Follow Up Instructions:   I  discussed the assessment and treatment plan with the patient. The patient was provided an opportunity to ask questions and all were answered. The patient agreed with the plan and demonstrated an understanding of the instructions.   The patient was advised to call back or seek an in-person evaluation if the symptoms worsen or if the condition fails to improve as anticipated.   Total minutes including chart review and phone contact time: 7   Follow up plan: Return if symptoms worsen or fail to improve.  Mechele Claude, MD [facility]

## 2021-02-18 ENCOUNTER — Other Ambulatory Visit: Payer: Self-pay | Admitting: Family Medicine

## 2021-02-18 DIAGNOSIS — I2510 Atherosclerotic heart disease of native coronary artery without angina pectoris: Secondary | ICD-10-CM

## 2021-02-18 DIAGNOSIS — I10 Essential (primary) hypertension: Secondary | ICD-10-CM

## 2021-04-02 ENCOUNTER — Ambulatory Visit (INDEPENDENT_AMBULATORY_CARE_PROVIDER_SITE_OTHER): Payer: Medicare Other | Admitting: Family Medicine

## 2021-04-02 ENCOUNTER — Encounter: Payer: Self-pay | Admitting: Family Medicine

## 2021-04-02 VITALS — BP 147/74 | HR 61 | Temp 97.0°F | Ht 69.0 in | Wt 175.0 lb

## 2021-04-02 DIAGNOSIS — E785 Hyperlipidemia, unspecified: Secondary | ICD-10-CM

## 2021-04-02 DIAGNOSIS — E1169 Type 2 diabetes mellitus with other specified complication: Secondary | ICD-10-CM | POA: Diagnosis not present

## 2021-04-02 DIAGNOSIS — Z125 Encounter for screening for malignant neoplasm of prostate: Secondary | ICD-10-CM | POA: Diagnosis not present

## 2021-04-02 DIAGNOSIS — I2510 Atherosclerotic heart disease of native coronary artery without angina pectoris: Secondary | ICD-10-CM | POA: Diagnosis not present

## 2021-04-02 DIAGNOSIS — E119 Type 2 diabetes mellitus without complications: Secondary | ICD-10-CM | POA: Diagnosis not present

## 2021-04-02 DIAGNOSIS — I10 Essential (primary) hypertension: Secondary | ICD-10-CM | POA: Diagnosis not present

## 2021-04-02 LAB — BAYER DCA HB A1C WAIVED: HB A1C (BAYER DCA - WAIVED): 7.7 % — ABNORMAL HIGH (ref 4.8–5.6)

## 2021-04-02 MED ORDER — METFORMIN HCL ER 500 MG PO TB24
ORAL_TABLET | ORAL | 2 refills | Status: DC
Start: 2021-04-02 — End: 2021-07-03

## 2021-04-02 MED ORDER — PANTOPRAZOLE SODIUM 40 MG PO TBEC: 40.0000 mg | DELAYED_RELEASE_TABLET | Freq: Every day | ORAL | 3 refills | Status: DC

## 2021-04-02 MED ORDER — AMLODIPINE BESYLATE 10 MG PO TABS: 10.0000 mg | ORAL_TABLET | Freq: Every day | ORAL | 2 refills | Status: DC

## 2021-04-02 MED ORDER — METOPROLOL TARTRATE 100 MG PO TABS: 100.0000 mg | ORAL_TABLET | Freq: Two times a day (BID) | ORAL | 2 refills | Status: DC

## 2021-04-02 MED ORDER — ATORVASTATIN CALCIUM 80 MG PO TABS: 80.0000 mg | ORAL_TABLET | Freq: Every day | ORAL | 2 refills | Status: DC

## 2021-04-02 MED ORDER — LISINOPRIL 40 MG PO TABS: 40.0000 mg | ORAL_TABLET | Freq: Every day | ORAL | 2 refills | Status: DC

## 2021-04-02 NOTE — Progress Notes (Signed)
Subjective:  Patient ID: Jesus Curtis,  male    DOB: [birth year]  Age: 80 y.o.    CC: Medical Management of Chronic Issues   HPI Jesus Curtis presents for  follow-up of hypertension. Patient has no history of headache chest pain or shortness of breath or recent cough. Patient also denies symptoms of TIA such as numbness weakness lateralizing. Patient denies side effects from medication. States taking it regularly. Home readings are around 112/70. Occasionally will go up.  Patient also  in for follow-up of elevated cholesterol. Doing well without complaints on current medication. Denies side effects  including myalgia and arthralgia and nausea. Also in today for liver function testing. Currently no chest pain, shortness of breath or other cardiovascular related symptoms noted.  Follow-up of diabetes. Patient does check blood sugar at home. Readings run between 130 and 140  Patient denies symptoms such as excessive hunger or urinary frequency, excessive hunger, nausea No significant hypoglycemic spells noted. Medications reviewed. Pt reports taking them regularly. Pt. denies complication/adverse reaction today.    History Jesus Curtis has a past medical history of Abnormal stress test, Aortic atherosclerosis ([place]), Chest pain, Elevated hemoglobin A1c, Elevated troponin, Hypertension, Nausea & vomiting, Snake bite, SVT (supraventricular tachycardia) ([place]), and Syncope.   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   His family history includes Heart attack in his brother, brother, father, and mother; Heart disease in his brother, father, mother, and paternal grandfather; Hypertension in his father, mother, and paternal grandfather; Stroke in his brother, maternal grandmother, and paternal grandmother.He reports that he has never smoked. He  has never used smokeless tobacco. He reports that he does not drink alcohol and does not use drugs.  Current Outpatient Medications on File Prior to Visit  Medication Sig Dispense Refill   acetaminophen (TYLENOL) 500 MG tablet Take 1,000 mg by mouth every 6 (six) hours as needed for mild pain.     aspirin EC 81 MG tablet Take 81 mg by mouth daily.     blood glucose meter kit and supplies KIT Dispense based on patient and insurance preference. Use up to four times daily as directed. (FOR ICD-9 250.00, 250.01). 1 each 11   fluticasone (FLONASE) 50 MCG/ACT nasal spray Place 1 spray into both nostrils 2 (two) times daily as needed for allergies or rhinitis. 16 g 6   GINSENG PO Take 2 tablets by mouth daily.     glucose blood (ONE TOUCH ULTRA TEST) test strip Use to check blood sugars up to four times a day 400 each 1   Multiple Vitamin (ONE-A-DAY MENS PO) Take 1 tablet by mouth daily.     ipratropium (ATROVENT) 0.03 % nasal spray PLACE 2 SPRAYS INTO BOTH NOSTRILS EVERY 12 (TWELVE) HOURS FOR 3 DAYS. 30 mL 0   No current facility-administered medications on file prior to visit.    ROS Review of Systems  Constitutional:  Negative for fever.  Respiratory:  Negative for shortness of breath.   Cardiovascular:  Negative for chest pain.  Musculoskeletal:  Negative for arthralgias.  Skin:  Negative for rash.   Objective:  BP (!) 147/74   Pulse 61   Temp (!) 97 F (36.1 C)   Ht _0  (1.753 m)   Wt 175 lb (79.4 kg)   SpO2 98%   BMI 25.84 kg/m   BP Readings from Last 3  Encounters:  04/02/21 (!) 147/74  01/03/21 140/78  01/03/21 140/74    Wt Readings from Last 3 Encounters:  04/02/21 175 lb (79.4 kg)  01/03/21 172 lb 9.6 oz (78.3 kg)  01/03/21 170 lb 12.8 oz (77.5 kg)     Physical Exam Constitutional:      General: He is not in acute distress.    Appearance: He is well-developed.  HENT:     Head: Normocephalic and atraumatic.     Right Ear: External ear normal.     Left Ear:  External ear normal.     Nose: Nose normal.  Eyes:     Conjunctiva/sclera: Conjunctivae normal.     Pupils: Pupils are equal, round, and reactive to light.  Cardiovascular:     Rate and Rhythm: Normal rate and regular rhythm.     Heart sounds: Normal heart sounds. No murmur heard. Pulmonary:     Effort: Pulmonary effort is normal. No respiratory distress.     Breath sounds: Normal breath sounds. No wheezing or rales.  Abdominal:     Palpations: Abdomen is soft.     Tenderness: There is no abdominal tenderness.  Musculoskeletal:        General: Normal range of motion.     Cervical back: Normal range of motion and neck supple.  Skin:    General: Skin is warm and dry.  Neurological:     Mental Status: He is alert and oriented to person, place, and time.     Deep Tendon Reflexes: Reflexes are normal and symmetric.  Psychiatric:        Behavior: Behavior normal.        Thought Content: Thought content normal.        Judgment: Judgment normal.    Diabetic Foot Exam - Simple   No data filed       Assessment & Plan:   [name] was seen today for medical management of chronic issues.  Diagnoses and all orders for this visit:  Type 2 diabetes mellitus without complication, without long-term current use of insulin (HCC) -     Bayer DCA Hb A1c Waived -     CBC with Differential/Platelet -     CMP14+EGFR -     atorvastatin (LIPITOR) 80 MG tablet; Take 1 tablet (80 mg total) by mouth daily. -     metFORMIN (GLUCOPHAGE-XR) 500 MG 24 hr tablet; TAKE 1 TABLET BY MOUTH  DAILY WITH BREAKFAST  Essential hypertension -     CBC with Differential/Platelet -     CMP14+EGFR -     lisinopril (ZESTRIL) 40 MG tablet; Take 1 tablet (40 mg total) by mouth daily. -     metoprolol tartrate (LOPRESSOR) 100 MG tablet; Take 1 tablet (100 mg total) by mouth 2 (two) times daily. -     amLODipine (NORVASC) 10 MG tablet; Take 1 tablet (10 mg total) by mouth daily.  Hyperlipidemia associated with type 2  diabetes mellitus ([place]) -     Lipid panel  Screening for prostate cancer -     PSA, total and free  ASCVD (arteriosclerotic cardiovascular disease) -     atorvastatin (LIPITOR) 80 MG tablet; Take 1 tablet (80 mg total) by mouth daily. -     metoprolol tartrate (LOPRESSOR) 100 MG tablet; Take 1 tablet (100 mg total) by mouth 2 (two) times daily.  Other orders -     pantoprazole (PROTONIX) 40 MG tablet; Take 1 tablet (40 mg total) by mouth daily. For stomach  I have discontinued Perez A. Giovanelli's amoxicillin-clavulanate. I have also changed his metoprolol tartrate. Additionally, I am having him maintain his Multiple Vitamin (ONE-A-DAY MENS PO), acetaminophen, GINSENG PO, aspirin EC, blood glucose meter kit and supplies, glucose blood, fluticasone, ipratropium, atorvastatin, lisinopril, metFORMIN, amLODipine, and pantoprazole.  Meds ordered this encounter  Medications   atorvastatin (LIPITOR) 80 MG tablet    Sig: Take 1 tablet (80 mg total) by mouth daily.    Dispense:  90 tablet    Refill:  2    Requesting 1 year supply   lisinopril (ZESTRIL) 40 MG tablet    Sig: Take 1 tablet (40 mg total) by mouth daily.    Dispense:  90 tablet    Refill:  2   metFORMIN (GLUCOPHAGE-XR) 500 MG 24 hr tablet    Sig: TAKE 1 TABLET BY MOUTH  DAILY WITH BREAKFAST    Dispense:  90 tablet    Refill:  2    Requesting 1 year supply   metoprolol tartrate (LOPRESSOR) 100 MG tablet    Sig: Take 1 tablet (100 mg total) by mouth 2 (two) times daily.    Dispense:  180 tablet    Refill:  2   amLODipine (NORVASC) 10 MG tablet    Sig: Take 1 tablet (10 mg total) by mouth daily.    Dispense:  90 tablet    Refill:  2   pantoprazole (PROTONIX) 40 MG tablet    Sig: Take 1 tablet (40 mg total) by mouth daily. For stomach    Dispense:  90 tablet    Refill:  3   After discussion pt. Agrees to try to eliminate potatoes from his diet  Follow-up: Return in about 3 months (around 07/01/2021).  Claretta Fraise,  M.D.

## 2021-04-03 LAB — CBC WITH DIFFERENTIAL/PLATELET
Basophils Absolute: 0 10*3/uL (ref 0.0–0.2)
Basos: 1 %
EOS (ABSOLUTE): 0.3 10*3/uL (ref 0.0–0.4)
Eos: 5 %
Hematocrit: 41.3 % (ref 37.5–51.0)
Hemoglobin: 13.8 g/dL (ref 13.0–17.7)
Immature Grans (Abs): 0 10*3/uL (ref 0.0–0.1)
Immature Granulocytes: 0 %
Lymphocytes Absolute: 2.5 10*3/uL (ref 0.7–3.1)
Lymphs: 45 %
MCH: 29.9 pg (ref 26.6–33.0)
MCHC: 33.4 g/dL (ref 31.5–35.7)
MCV: 89 fL (ref 79–97)
Monocytes Absolute: 0.5 10*3/uL (ref 0.1–0.9)
Monocytes: 9 %
Neutrophils Absolute: 2.2 10*3/uL (ref 1.4–7.0)
Neutrophils: 40 %
Platelets: 249 10*3/uL (ref 150–450)
RBC: 4.62 x10E6/uL (ref 4.14–5.80)
RDW: 12.7 % (ref 11.6–15.4)
WBC: 5.5 10*3/uL (ref 3.4–10.8)

## 2021-04-03 LAB — CMP14+EGFR
ALT: 26 IU/L (ref 0–44)
AST: 28 IU/L (ref 0–40)
Albumin/Globulin Ratio: 2 (ref 1.2–2.2)
Albumin: 4.3 g/dL (ref 3.7–4.7)
Alkaline Phosphatase: 77 IU/L (ref 44–121)
BUN/Creatinine Ratio: 10 (ref 10–24)
BUN: 9 mg/dL (ref 8–27)
Bilirubin Total: 0.4 mg/dL (ref 0.0–1.2)
CO2: 22 mmol/L (ref 20–29)
Calcium: 9.9 mg/dL (ref 8.6–10.2)
Chloride: 104 mmol/L (ref 96–106)
Creatinine, Ser: 0.89 mg/dL (ref 0.76–1.27)
Globulin, Total: 2.2 g/dL (ref 1.5–4.5)
Glucose: 159 mg/dL — ABNORMAL HIGH (ref 70–99)
Potassium: 5.1 mmol/L (ref 3.5–5.2)
Sodium: 141 mmol/L (ref 134–144)
Total Protein: 6.5 g/dL (ref 6.0–8.5)
eGFR: 88 mL/min/{1.73_m2} (ref >59)

## 2021-04-03 LAB — PSA, TOTAL AND FREE
PSA, Free Pct: 17.9 %
PSA, Free: 0.84 ng/mL
Prostate Specific Ag, Serum: 4.7 ng/mL — ABNORMAL HIGH (ref 0.0–4.0)

## 2021-04-03 LAB — LIPID PANEL
Chol/HDL Ratio: 2.7 ratio (ref 0.0–5.0)
Cholesterol, Total: 87 mg/dL — ABNORMAL LOW (ref 100–199)
HDL: 32 mg/dL — ABNORMAL LOW (ref >39)
LDL Chol Calc (NIH): 38 mg/dL (ref 0–99)
Triglycerides: 83 mg/dL (ref 0–149)
VLDL Cholesterol Cal: 17 mg/dL (ref 5–40)

## 2021-04-10 ENCOUNTER — Telehealth: Payer: Self-pay | Admitting: Family Medicine

## 2021-04-11 ENCOUNTER — Other Ambulatory Visit: Payer: Self-pay | Admitting: Family Medicine

## 2021-04-11 ENCOUNTER — Telehealth: Payer: Self-pay | Admitting: Family Medicine

## 2021-04-11 DIAGNOSIS — R972 Elevated prostate specific antigen [PSA]: Secondary | ICD-10-CM

## 2021-04-11 NOTE — Telephone Encounter (Unsigned)
REFERRAL REQUEST Telephone Note  Have you been seen at our office for this problem? yes (Advise that they may need an appointment with their PCP before a referral can be done)  Reason for Referral: neurology Referral discussed with patient: yes  Best contact number of patient for referral team: [phone]    Has patient been seen by a specialist for this issue before: no  Patient provider preference for referral: na Patient location preference for referral: [place]   Patient notified that referrals can take up to a week or longer to process. If they haven't heard anything within a week they should call back and speak with the referral department.

## 2021-04-11 NOTE — Telephone Encounter (Signed)
Referral placed, as requested WS 

## 2021-04-11 NOTE — Telephone Encounter (Signed)
The referral is for a Urologist not neuro! It is for prostate

## 2021-04-11 NOTE — Telephone Encounter (Signed)
What is the symptom or concern for which he wants a referral?

## 2021-04-25 ENCOUNTER — Encounter: Payer: Self-pay | Admitting: *Deleted

## 2021-07-03 ENCOUNTER — Ambulatory Visit (INDEPENDENT_AMBULATORY_CARE_PROVIDER_SITE_OTHER): Payer: Medicare Other | Admitting: Family Medicine

## 2021-07-03 ENCOUNTER — Encounter: Payer: Self-pay | Admitting: Family Medicine

## 2021-07-03 VITALS — BP 161/77 | HR 60 | Temp 96.8°F | Ht 69.0 in | Wt 176.4 lb

## 2021-07-03 DIAGNOSIS — E1169 Type 2 diabetes mellitus with other specified complication: Secondary | ICD-10-CM | POA: Diagnosis not present

## 2021-07-03 DIAGNOSIS — E119 Type 2 diabetes mellitus without complications: Secondary | ICD-10-CM

## 2021-07-03 DIAGNOSIS — E785 Hyperlipidemia, unspecified: Secondary | ICD-10-CM | POA: Diagnosis not present

## 2021-07-03 DIAGNOSIS — I10 Essential (primary) hypertension: Secondary | ICD-10-CM

## 2021-07-03 LAB — BAYER DCA HB A1C WAIVED: HB A1C (BAYER DCA - WAIVED): 7.9 % — ABNORMAL HIGH (ref 4.8–5.6)

## 2021-07-03 MED ORDER — METFORMIN HCL ER 500 MG PO TB24: 1000.0000 mg | ORAL_TABLET | Freq: Every day | ORAL | 3 refills | Status: DC

## 2021-07-03 NOTE — Progress Notes (Signed)
? ?Subjective:  ?Patient ID: Jesus Curtis,  ?male    DOB: April 21, 1941  Age: 81 y.o.  ? ? ?CC: Medical Management of Chronic Issues ? ? ?HPI ?Jesus Curtis presents for  follow-up of hypertension. Patient has no history of headache chest pain or shortness of breath or recent cough. Patient also denies symptoms of TIA such as numbness weakness lateralizing. Patient denies side effects from medication. States taking it regularly. Home readings running 125/68 usually.  ? ?Patient also  in for follow-up of elevated cholesterol. Doing well without complaints on current medication. Denies side effects  including myalgia and arthralgia and nausea. Also in today for liver function testing. Currently no chest pain, shortness of breath or other cardiovascular related symptoms noted. ? ?Follow-up of diabetes. Patient does not check blood sugar at home.  ?Patient denies symptoms such as excessive hunger or urinary frequency, excessive hunger, nausea ?No significant hypoglycemic spells noted. ?Medications reviewed. Pt reports taking them regularly. Pt. denies complication/adverse reaction today.  ? ? ?History ?Jesus Curtis has a past medical history of Abnormal stress test, Aortic atherosclerosis ([place]), Chest pain, Elevated hemoglobin A1c, Elevated troponin, Hypertension, Nausea & vomiting, Snake bite, SVT (supraventricular tachycardia) ([place]), and Syncope.  ? 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  ? ?His family history includes Heart attack in his brother, brother, father, and mother; Heart disease in his brother, father, mother, and paternal grandfather; Hypertension in his father, mother, and paternal grandfather; Stroke in his brother, maternal grandmother, and paternal grandmother.Jesus Curtis reports that Jesus Curtis has never smoked. Jesus Curtis has never used smokeless tobacco. Jesus Curtis reports that  Jesus Curtis does not drink alcohol and does not use drugs. ? ?Current Outpatient Medications on File Prior to Visit  ?Medication Sig Dispense Refill  ? acetaminophen (TYLENOL) 500 MG tablet Take 1,000 mg by mouth every 6 (six) hours as needed for mild pain.    ? aspirin EC 81 MG tablet Take 81 mg by mouth daily.    ? atorvastatin (LIPITOR) 80 MG tablet Take 1 tablet (80 mg total) by mouth daily. 90 tablet 2  ? blood glucose meter kit and supplies KIT Dispense based on patient and insurance preference. Use up to four times daily as directed. (FOR ICD-9 250.00, 250.01). 1 each 11  ? fluticasone (FLONASE) 50 MCG/ACT nasal spray Place 1 spray into both nostrils 2 (two) times daily as needed for allergies or rhinitis. 16 g 6  ? GINSENG PO Take 2 tablets by mouth daily.    ? glucose blood (ONE TOUCH ULTRA TEST) test strip Use to check blood sugars up to four times a day 400 each 1  ? lisinopril (ZESTRIL) 40 MG tablet Take 1 tablet (40 mg total) by mouth daily. 90 tablet 2  ? metoprolol tartrate (LOPRESSOR) 100 MG tablet Take 1 tablet (100 mg total) by mouth 2 (two) times daily. 180 tablet 2  ? Multiple Vitamin (ONE-A-DAY MENS PO) Take 1 tablet by mouth daily.    ? pantoprazole (PROTONIX) 40 MG tablet Take 1 tablet (40 mg total) by mouth daily. For stomach 90 tablet 3  ? amLODipine (NORVASC) 10 MG tablet Take 1 tablet (10 mg total) by mouth daily. 90 tablet 2  ? ipratropium (ATROVENT) 0.03 % nasal spray PLACE 2 SPRAYS INTO BOTH NOSTRILS EVERY 12 (TWELVE) HOURS FOR 3 DAYS. 30 mL 0  ? ?No current facility-administered medications on file  prior to visit.  ? ? ?ROS ?Review of Systems  ?Constitutional:  Negative for fever.  ?Respiratory:  Negative for shortness of breath.   ?Cardiovascular:  Negative for chest pain.  ?Musculoskeletal:  Negative for arthralgias.  ?Skin:  Negative for rash.  ? ?Objective:  ?BP (!) 161/77   Pulse 60   Temp (!) 96.8 ?F (36 ?C)   Ht _0  (1.753 m)   Wt 176 lb 6.4 oz (80 kg)   SpO2 96%   BMI 26.05  kg/m?  ? ?BP Readings from Last 3 Encounters:  ?07/03/21 (!) 161/77  ?04/02/21 (!) 147/74  ?01/03/21 140/78  ? ? ?Wt Readings from Last 3 Encounters:  ?07/03/21 176 lb 6.4 oz (80 kg)  ?04/02/21 175 lb (79.4 kg)  ?01/03/21 172 lb 9.6 oz (78.3 kg)  ? ? ? ?Physical Exam ?Vitals reviewed.  ?Constitutional:   ?   Appearance: Jesus Curtis is well-developed.  ?HENT:  ?   Head: Normocephalic and atraumatic.  ?   Right Ear: External ear normal.  ?   Left Ear: External ear normal.  ?   Mouth/Throat:  ?   Pharynx: No oropharyngeal exudate or posterior oropharyngeal erythema.  ?Eyes:  ?   Pupils: Pupils are equal, round, and reactive to light.  ?Cardiovascular:  ?   Rate and Rhythm: Normal rate and regular rhythm.  ?   Heart sounds: No murmur heard. ?Pulmonary:  ?   Effort: No respiratory distress.  ?   Breath sounds: Normal breath sounds.  ?Musculoskeletal:  ?   Cervical back: Normal range of motion and neck supple.  ?Neurological:  ?   Mental Status: Jesus Curtis is alert and oriented to person, place, and time.  ? ? ?Diabetic Foot Exam - Simple   ?No data filed ?  ? ? ? ? ?Assessment & Plan:  ? ?Jesus Curtis was seen today for medical management of chronic issues. ? ?Diagnoses and all orders for this visit: ? ?Type 2 diabetes mellitus without complication, without long-term current use of insulin ([place]) ?-     Bayer DCA Hb A1c Waived ?-     metFORMIN (GLUCOPHAGE-XR) 500 MG 24 hr tablet; Take 2 tablets (1,000 mg total) by mouth daily with breakfast. ? ?Essential hypertension ?-     CBC with Differential/Platelet ?-     CMP14+EGFR ? ?Hyperlipidemia associated with type 2 diabetes mellitus ([place]) ?-     Lipid panel ? ? ?I have changed Jesus Curtis's metFORMIN. I am also having him maintain his Multiple Vitamin (ONE-A-DAY MENS PO), acetaminophen, GINSENG PO, aspirin EC, blood glucose meter kit and supplies, glucose blood, fluticasone, ipratropium, atorvastatin, lisinopril, metoprolol tartrate, amLODipine, and pantoprazole. ? ?Meds ordered this encounter   ?Medications  ? metFORMIN (GLUCOPHAGE-XR) 500 MG 24 hr tablet  ?  Sig: Take 2 tablets (1,000 mg total) by mouth daily with breakfast.  ?  Dispense:  180 tablet  ?  Refill:  3  ?  Requesting 1 year supply  ? ? ? ?Follow-up: Return in about 3 months (around 10/03/2021) for diabetes. ? ?Claretta Fraise, M.D. ?

## 2021-07-04 LAB — CMP14+EGFR
ALT: 30 IU/L (ref 0–44)
AST: 29 IU/L (ref 0–40)
Albumin/Globulin Ratio: 2 (ref 1.2–2.2)
Albumin: 4.4 g/dL (ref 3.7–4.7)
Alkaline Phosphatase: 80 IU/L (ref 44–121)
BUN/Creatinine Ratio: 11 (ref 10–24)
BUN: 10 mg/dL (ref 8–27)
Bilirubin Total: 0.5 mg/dL (ref 0.0–1.2)
CO2: 22 mmol/L (ref 20–29)
Calcium: 9.5 mg/dL (ref 8.6–10.2)
Chloride: 103 mmol/L (ref 96–106)
Creatinine, Ser: 0.88 mg/dL (ref 0.76–1.27)
Globulin, Total: 2.2 g/dL (ref 1.5–4.5)
Glucose: 191 mg/dL — ABNORMAL HIGH (ref 70–99)
Potassium: 5.1 mmol/L (ref 3.5–5.2)
Sodium: 139 mmol/L (ref 134–144)
Total Protein: 6.6 g/dL (ref 6.0–8.5)
eGFR: 87 mL/min/{1.73_m2} (ref >59)

## 2021-07-04 LAB — CBC WITH DIFFERENTIAL/PLATELET
Basophils Absolute: 0 10*3/uL (ref 0.0–0.2)
Basos: 1 %
EOS (ABSOLUTE): 0.3 10*3/uL (ref 0.0–0.4)
Eos: 5 %
Hematocrit: 39.1 % (ref 37.5–51.0)
Hemoglobin: 13.4 g/dL (ref 13.0–17.7)
Immature Grans (Abs): 0 10*3/uL (ref 0.0–0.1)
Immature Granulocytes: 0 %
Lymphocytes Absolute: 2.5 10*3/uL (ref 0.7–3.1)
Lymphs: 44 %
MCH: 31 pg (ref 26.6–33.0)
MCHC: 34.3 g/dL (ref 31.5–35.7)
MCV: 91 fL (ref 79–97)
Monocytes Absolute: 0.5 10*3/uL (ref 0.1–0.9)
Monocytes: 9 %
Neutrophils Absolute: 2.4 10*3/uL (ref 1.4–7.0)
Neutrophils: 41 %
Platelets: 220 10*3/uL (ref 150–450)
RBC: 4.32 x10E6/uL (ref 4.14–5.80)
RDW: 13.7 % (ref 11.6–15.4)
WBC: 5.7 10*3/uL (ref 3.4–10.8)

## 2021-07-04 LAB — LIPID PANEL
Chol/HDL Ratio: 3.1 ratio (ref 0.0–5.0)
Cholesterol, Total: 102 mg/dL (ref 100–199)
HDL: 33 mg/dL — ABNORMAL LOW (ref >39)
LDL Chol Calc (NIH): 52 mg/dL (ref 0–99)
Triglycerides: 85 mg/dL (ref 0–149)
VLDL Cholesterol Cal: 17 mg/dL (ref 5–40)

## 2021-07-29 DIAGNOSIS — E119 Type 2 diabetes mellitus without complications: Secondary | ICD-10-CM | POA: Diagnosis not present

## 2021-07-29 DIAGNOSIS — H2513 Age-related nuclear cataract, bilateral: Secondary | ICD-10-CM | POA: Diagnosis not present

## 2021-07-29 DIAGNOSIS — Z7984 Long term (current) use of oral hypoglycemic drugs: Secondary | ICD-10-CM | POA: Diagnosis not present

## 2021-07-29 LAB — HM DIABETES EYE EXAM

## 2021-08-01 ENCOUNTER — Telehealth: Payer: Self-pay | Admitting: Family Medicine

## 2021-08-01 NOTE — Telephone Encounter (Signed)
Called patient left message to call us back per patient chart he has  ?Family Medicine Type 2 diabetes mellitus without complication  ?And Arteriosclerotic cardiovascular heart disease  ?

## 2021-08-05 DIAGNOSIS — H25812 Combined forms of age-related cataract, left eye: Secondary | ICD-10-CM | POA: Diagnosis not present

## 2021-08-05 DIAGNOSIS — Z01818 Encounter for other preprocedural examination: Secondary | ICD-10-CM | POA: Diagnosis not present

## 2021-08-05 DIAGNOSIS — H25811 Combined forms of age-related cataract, right eye: Secondary | ICD-10-CM | POA: Diagnosis not present

## 2021-08-05 NOTE — Telephone Encounter (Signed)
Called patient, no answer, left message to return call 

## 2021-08-07 NOTE — Telephone Encounter (Signed)
Called patient, no answer, left message to return call 

## 2021-08-09 DIAGNOSIS — H25812 Combined forms of age-related cataract, left eye: Secondary | ICD-10-CM | POA: Diagnosis not present

## 2021-08-12 NOTE — Telephone Encounter (Signed)
CALLED PATIENT, NO ANSWER °

## 2021-08-22 DIAGNOSIS — H25811 Combined forms of age-related cataract, right eye: Secondary | ICD-10-CM | POA: Diagnosis not present

## 2021-10-03 ENCOUNTER — Ambulatory Visit (INDEPENDENT_AMBULATORY_CARE_PROVIDER_SITE_OTHER): Payer: Medicare Other | Admitting: Family Medicine

## 2021-10-03 ENCOUNTER — Encounter: Payer: Self-pay | Admitting: Family Medicine

## 2021-10-03 VITALS — BP 147/70 | HR 58 | Temp 97.3°F | Ht 69.0 in | Wt 177.2 lb

## 2021-10-03 DIAGNOSIS — E1169 Type 2 diabetes mellitus with other specified complication: Secondary | ICD-10-CM

## 2021-10-03 DIAGNOSIS — E785 Hyperlipidemia, unspecified: Secondary | ICD-10-CM

## 2021-10-03 DIAGNOSIS — E119 Type 2 diabetes mellitus without complications: Secondary | ICD-10-CM

## 2021-10-03 DIAGNOSIS — I10 Essential (primary) hypertension: Secondary | ICD-10-CM

## 2021-10-03 LAB — BAYER DCA HB A1C WAIVED: HB A1C (BAYER DCA - WAIVED): 7.3 % — ABNORMAL HIGH (ref 4.8–5.6)

## 2021-10-03 MED ORDER — VALSARTAN-HYDROCHLOROTHIAZIDE 320-25 MG PO TABS: 1.0000 | ORAL_TABLET | Freq: Every day | ORAL | 3 refills | Status: DC

## 2021-10-03 NOTE — Patient Instructions (Signed)
Stop taking lisinopril when the valsartan arrives in the mail.

## 2021-10-03 NOTE — Progress Notes (Signed)
Subjective:  Patient ID: Jesus Curtis,  male    DOB: [birth year]  Age: 81 y.o.    CC: Medical Management of Chronic Issues   HPI Jesus Curtis presents for  follow-up of hypertension. Patient has no history of headache chest pain or shortness of breath or recent cough. Patient also denies symptoms of TIA such as numbness weakness lateralizing. Patient denies side effects from medication. States taking it regularly.  Patient also  in for follow-up of elevated cholesterol. Doing well without complaints on current medication. Denies side effects  including myalgia and arthralgia and nausea. Also in today for liver function testing. Currently no chest pain, shortness of breath or other cardiovascular related symptoms noted.  Follow-up of diabetes. Patient does check blood sugar at home. Readings run between 100 and 110 Patient denies symptoms such as excessive hunger or urinary frequency, excessive hunger, nausea No significant hypoglycemic spells noted. Medications reviewed. Pt reports taking them regularly. Pt. denies complication/adverse reaction today.    History Jesus Curtis has a past medical history of Abnormal stress test, Aortic atherosclerosis ([place]), Chest pain, Elevated hemoglobin A1c, Elevated troponin, Hypertension, Nausea & vomiting, Snake bite, SVT (supraventricular tachycardia) ([place]), and Syncope.   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   His family history includes Heart attack in his brother, brother, father, and mother; Heart disease in his brother, father, mother, and paternal grandfather; Hypertension in his father, mother, and paternal grandfather; Stroke in his brother, maternal grandmother, and paternal grandmother.He reports that he has never smoked. He has never used smokeless tobacco. He reports that he does  not drink alcohol and does not use drugs.  Current Outpatient Medications on File Prior to Visit  Medication Sig Dispense Refill   acetaminophen (TYLENOL) 500 MG tablet Take 1,000 mg by mouth every 6 (six) hours as needed for mild pain.     aspirin EC 81 MG tablet Take 81 mg by mouth daily.     atorvastatin (LIPITOR) 80 MG tablet Take 1 tablet (80 mg total) by mouth daily. 90 tablet 2   fluticasone (FLONASE) 50 MCG/ACT nasal spray Place 1 spray into both nostrils 2 (two) times daily as needed for allergies or rhinitis. 16 g 6   GINSENG PO Take 2 tablets by mouth daily.     glucose blood (ONE TOUCH ULTRA TEST) test strip Use to check blood sugars up to four times a day 400 each 1   metFORMIN (GLUCOPHAGE-XR) 500 MG 24 hr tablet Take 2 tablets (1,000 mg total) by mouth daily with breakfast. 180 tablet 3   metoprolol tartrate (LOPRESSOR) 100 MG tablet Take 1 tablet (100 mg total) by mouth 2 (two) times daily. 180 tablet 2   Multiple Vitamin (ONE-A-DAY MENS PO) Take 1 tablet by mouth daily.     pantoprazole (PROTONIX) 40 MG tablet Take 1 tablet (40 mg total) by mouth daily. For stomach 90 tablet 3   amLODipine (NORVASC) 10 MG tablet Take 1 tablet (10 mg total) by mouth daily. 90 tablet 2   ipratropium (ATROVENT) 0.03 % nasal spray PLACE 2 SPRAYS INTO BOTH NOSTRILS EVERY 12 (TWELVE) HOURS FOR 3 DAYS. 30 mL 0   No current facility-administered medications on file prior to visit.    ROS Review of Systems  Constitutional:  Negative for fever.  Respiratory:  Negative for shortness of breath.   Cardiovascular:  Negative  for chest pain.  Musculoskeletal:  Negative for arthralgias.  Skin:  Negative for rash.    Objective:  BP (!) 147/70   Pulse (!) 58   Temp (!) 97.3 F (36.3 C)   Ht '5\' 9"'  (1.753 m)   Wt 177 lb 3.2 oz (80.4 kg)   SpO2 99%   BMI 26.17 kg/m   BP Readings from Last 3 Encounters:  10/03/21 (!) 147/70  07/03/21 (!) 161/77  04/02/21 (!) 147/74    Wt Readings from Last 3  Encounters:  10/03/21 177 lb 3.2 oz (80.4 kg)  07/03/21 176 lb 6.4 oz (80 kg)  04/02/21 175 lb (79.4 kg)     Physical Exam Vitals reviewed.  Constitutional:      Appearance: He is well-developed.  HENT:     Head: Normocephalic and atraumatic.     Right Ear: External ear normal.     Left Ear: External ear normal.     Mouth/Throat:     Pharynx: No oropharyngeal exudate or posterior oropharyngeal erythema.  Eyes:     Pupils: Pupils are equal, round, and reactive to light.  Cardiovascular:     Rate and Rhythm: Normal rate and regular rhythm.     Heart sounds: No murmur heard. Pulmonary:     Effort: No respiratory distress.     Breath sounds: Normal breath sounds.  Musculoskeletal:     Cervical back: Normal range of motion and neck supple.  Neurological:     Mental Status: He is alert and oriented to person, place, and time.     Diabetic Foot Exam - Simple   No data filed     Lab Results  Component Value Date   HGBA1C 7.9 (H) 07/03/2021   HGBA1C 7.7 (H) 04/02/2021   HGBA1C 7.0 (H) 11/28/2020    Assessment & Plan:   Jesus Curtis was seen today for medical management of chronic issues.  Diagnoses and all orders for this visit:  Type 2 diabetes mellitus without complication, without long-term current use of insulin (HCC) -     Bayer DCA Hb A1c Waived  Essential hypertension -     CBC with Differential/Platelet -     CMP14+EGFR  Hyperlipidemia associated with type 2 diabetes mellitus ([place]) -     Lipid panel  Other orders -     valsartan-hydrochlorothiazide (DIOVAN HCT) 320-25 MG tablet; Take 1 tablet by mouth daily. For Blood Pressure   I have discontinued Darivs A. Marro's blood glucose meter kit and supplies and lisinopril. I am also having him start on valsartan-hydrochlorothiazide. Additionally, I am having him maintain his Multiple Vitamin (ONE-A-DAY MENS PO), acetaminophen, GINSENG PO, aspirin EC, glucose blood, fluticasone, ipratropium, atorvastatin, metoprolol  tartrate, amLODipine, pantoprazole, and metFORMIN.  Meds ordered this encounter  Medications   valsartan-hydrochlorothiazide (DIOVAN HCT) 320-25 MG tablet    Sig: Take 1 tablet by mouth daily. For Blood Pressure    Dispense:  90 tablet    Refill:  3     Follow-up: Return in about 3 months (around 01/03/2022).  Claretta Fraise, M.D.

## 2021-10-04 LAB — CBC WITH DIFFERENTIAL/PLATELET
Basophils Absolute: 0.1 10*3/uL (ref 0.0–0.2)
Basos: 1 %
EOS (ABSOLUTE): 0.3 10*3/uL (ref 0.0–0.4)
Eos: 5 %
Hematocrit: 38.2 % (ref 37.5–51.0)
Hemoglobin: 12.9 g/dL — ABNORMAL LOW (ref 13.0–17.7)
Immature Grans (Abs): 0 10*3/uL (ref 0.0–0.1)
Immature Granulocytes: 0 %
Lymphocytes Absolute: 2.3 10*3/uL (ref 0.7–3.1)
Lymphs: 36 %
MCH: 30.9 pg (ref 26.6–33.0)
MCHC: 33.8 g/dL (ref 31.5–35.7)
MCV: 92 fL (ref 79–97)
Monocytes Absolute: 0.5 10*3/uL (ref 0.1–0.9)
Monocytes: 8 %
Neutrophils Absolute: 3.2 10*3/uL (ref 1.4–7.0)
Neutrophils: 50 %
Platelets: 228 10*3/uL (ref 150–450)
RBC: 4.17 x10E6/uL (ref 4.14–5.80)
RDW: 13.2 % (ref 11.6–15.4)
WBC: 6.4 10*3/uL (ref 3.4–10.8)

## 2021-10-04 LAB — LIPID PANEL
Chol/HDL Ratio: 2.7 ratio (ref 0.0–5.0)
Cholesterol, Total: 85 mg/dL — ABNORMAL LOW (ref 100–199)
HDL: 31 mg/dL — ABNORMAL LOW (ref >39)
LDL Chol Calc (NIH): 35 mg/dL (ref 0–99)
Triglycerides: 96 mg/dL (ref 0–149)
VLDL Cholesterol Cal: 19 mg/dL (ref 5–40)

## 2021-10-04 LAB — CMP14+EGFR
ALT: 28 IU/L (ref 0–44)
AST: 27 IU/L (ref 0–40)
Albumin/Globulin Ratio: 1.8 (ref 1.2–2.2)
Albumin: 4.2 g/dL (ref 3.7–4.7)
Alkaline Phosphatase: 73 IU/L (ref 44–121)
BUN/Creatinine Ratio: 10 (ref 10–24)
BUN: 8 mg/dL (ref 8–27)
Bilirubin Total: 0.3 mg/dL (ref 0.0–1.2)
CO2: 22 mmol/L (ref 20–29)
Calcium: 9.7 mg/dL (ref 8.6–10.2)
Chloride: 104 mmol/L (ref 96–106)
Creatinine, Ser: 0.83 mg/dL (ref 0.76–1.27)
Globulin, Total: 2.4 g/dL (ref 1.5–4.5)
Glucose: 209 mg/dL — ABNORMAL HIGH (ref 70–99)
Potassium: 5.4 mmol/L — ABNORMAL HIGH (ref 3.5–5.2)
Sodium: 139 mmol/L (ref 134–144)
Total Protein: 6.6 g/dL (ref 6.0–8.5)
eGFR: 89 mL/min/{1.73_m2} (ref >59)

## 2021-10-06 NOTE — Progress Notes (Signed)
Hello [place],  Your lab result is normal and/or stable.Some minor variations that are not significant are commonly marked abnormal, but do not represent any medical problem for you.  Best regards, Kynnadi Dicenso, M.D.

## 2022-01-06 ENCOUNTER — Encounter: Payer: Self-pay | Admitting: Family Medicine

## 2022-01-06 ENCOUNTER — Ambulatory Visit (INDEPENDENT_AMBULATORY_CARE_PROVIDER_SITE_OTHER): Payer: Medicare Other | Admitting: Family Medicine

## 2022-01-06 VITALS — BP 145/75 | HR 63 | Temp 97.5°F | Ht 69.0 in | Wt 171.2 lb

## 2022-01-06 DIAGNOSIS — E785 Hyperlipidemia, unspecified: Secondary | ICD-10-CM | POA: Diagnosis not present

## 2022-01-06 DIAGNOSIS — E1169 Type 2 diabetes mellitus with other specified complication: Secondary | ICD-10-CM

## 2022-01-06 DIAGNOSIS — I2510 Atherosclerotic heart disease of native coronary artery without angina pectoris: Secondary | ICD-10-CM

## 2022-01-06 DIAGNOSIS — I10 Essential (primary) hypertension: Secondary | ICD-10-CM | POA: Diagnosis not present

## 2022-01-06 DIAGNOSIS — E119 Type 2 diabetes mellitus without complications: Secondary | ICD-10-CM

## 2022-01-06 LAB — BAYER DCA HB A1C WAIVED: HB A1C (BAYER DCA - WAIVED): 11.5 % — ABNORMAL HIGH (ref 4.8–5.6)

## 2022-01-06 MED ORDER — METOPROLOL TARTRATE 100 MG PO TABS: 100.0000 mg | ORAL_TABLET | Freq: Two times a day (BID) | ORAL | 3 refills | Status: DC

## 2022-01-06 MED ORDER — ATORVASTATIN CALCIUM 80 MG PO TABS: 80.0000 mg | ORAL_TABLET | Freq: Every day | ORAL | 3 refills | Status: DC

## 2022-01-06 MED ORDER — AMLODIPINE BESYLATE 10 MG PO TABS: 10.0000 mg | ORAL_TABLET | Freq: Every day | ORAL | 3 refills | Status: DC

## 2022-01-06 MED ORDER — VALSARTAN 80 MG PO TABS: 80.0000 mg | ORAL_TABLET | Freq: Every day | ORAL | 3 refills | Status: DC

## 2022-01-06 MED ORDER — DAPAGLIFLOZIN PROPANEDIOL 10 MG PO TABS: 10.0000 mg | ORAL_TABLET | Freq: Every day | ORAL | 3 refills | Status: DC

## 2022-01-06 NOTE — Progress Notes (Signed)
Subjective:  Patient ID: Jesus Curtis,  male    DOB: 1941/03/20  Age: 81 y.o.    CC: Medical Management of Chronic Issues   HPI Jesus Curtis presents for  follow-up of hypertension. Patient has no history of headache chest pain or shortness of breath or recent cough. Patient also denies symptoms of TIA such as numbness weakness lateralizing. Patient denies side effects from medication. States taking it regularly.  Patient also  in for follow-up of elevated cholesterol. Doing well without complaints on current medication. Denies side effects  including myalgia and arthralgia and nausea. Also in today for liver function testing. Currently no chest pain, shortness of breath or other cardiovascular related symptoms noted.  Follow-up of diabetes. Patient does check blood sugar at home. Last month was going up as high as 375. Happened when cutting hay. Patient denies symptoms such as excessive hunger or urinary frequency, excessive hunger, nausea No significant hypoglycemic spells noted. Medications reviewed. Pt reports taking them regularly. Pt. denies complication/adverse reaction today.    History Jesus Curtis has a past medical history of Abnormal stress test, Aortic atherosclerosis ([place]), Chest pain, Elevated hemoglobin A1c, Elevated troponin, Hypertension, Nausea & vomiting, Snake bite, SVT (supraventricular tachycardia) ([place]), and Syncope.   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   His family history includes Heart attack in his brother, brother, father, and mother; Heart disease in his brother, father, mother, and paternal grandfather; Hypertension in his father, mother, and paternal grandfather; Stroke in his brother, maternal grandmother, and paternal grandmother.He reports that he has never smoked. He has never used smokeless  tobacco. He reports that he does not drink alcohol and does not use drugs.  Current Outpatient Medications on File Prior to Visit  Medication Sig Dispense Refill   acetaminophen (TYLENOL) 500 MG tablet Take 1,000 mg by mouth every 6 (six) hours as needed for mild pain.     aspirin EC 81 MG tablet Take 81 mg by mouth daily.     fluticasone (FLONASE) 50 MCG/ACT nasal spray Place 1 spray into both nostrils 2 (two) times daily as needed for allergies or rhinitis. 16 g 6   GINSENG PO Take 2 tablets by mouth daily.     glucose blood (ONE TOUCH ULTRA TEST) test strip Use to check blood sugars up to four times a day 400 each 1   metFORMIN (GLUCOPHAGE-XR) 500 MG 24 hr tablet Take 2 tablets (1,000 mg total) by mouth daily with breakfast. 180 tablet 3   Multiple Vitamin (ONE-A-DAY MENS PO) Take 1 tablet by mouth daily.     pantoprazole (PROTONIX) 40 MG tablet Take 1 tablet (40 mg total) by mouth daily. For stomach 90 tablet 3   ipratropium (ATROVENT) 0.03 % nasal spray PLACE 2 SPRAYS INTO BOTH NOSTRILS EVERY 12 (TWELVE) HOURS FOR 3 DAYS. 30 mL 0   No current facility-administered medications on file prior to visit.    ROS Review of Systems  Constitutional:  Negative for fever.  Respiratory:  Negative for shortness of breath.   Cardiovascular:  Negative for chest pain.  Musculoskeletal:  Negative for arthralgias.  Skin:  Negative for rash.    Objective:  BP (!) 145/75   Pulse 63   Temp (!) 97.5 F (36.4 C)   Ht _0  (1.753 m)   Wt 171 lb 3.2 oz (77.7 kg)   SpO2  98%   BMI 25.28 kg/m   BP Readings from Last 3 Encounters:  01/06/22 (!) 145/75  10/03/21 (!) 147/70  07/03/21 (!) 161/77    Wt Readings from Last 3 Encounters:  01/06/22 171 lb 3.2 oz (77.7 kg)  10/03/21 177 lb 3.2 oz (80.4 kg)  07/03/21 176 lb 6.4 oz (80 kg)     Physical Exam Vitals reviewed.  Constitutional:      Appearance: He is well-developed.  HENT:     Head: Normocephalic and atraumatic.     Right Ear:  External ear normal.     Left Ear: External ear normal.     Mouth/Throat:     Pharynx: No oropharyngeal exudate or posterior oropharyngeal erythema.  Eyes:     Pupils: Pupils are equal, round, and reactive to light.  Cardiovascular:     Rate and Rhythm: Normal rate and regular rhythm.     Heart sounds: No murmur heard. Pulmonary:     Effort: No respiratory distress.     Breath sounds: Normal breath sounds.  Musculoskeletal:     Cervical back: Normal range of motion and neck supple.  Neurological:     Mental Status: He is alert and oriented to person, place, and time.     Diabetic Foot Exam - Simple   Simple Foot Form Diabetic Foot exam was performed with the following findings: Yes 01/06/2022  9:21 AM  Visual Inspection No deformities, no ulcerations, no other skin breakdown bilaterally: Yes Sensation Testing Intact to touch and monofilament testing bilaterally: Yes Pulse Check Posterior Tibialis and Dorsalis pulse intact bilaterally: Yes Comments     Lab Results  Component Value Date   HGBA1C 7.3 (H) 10/03/2021   HGBA1C 7.9 (H) 07/03/2021   HGBA1C 7.7 (H) 04/02/2021    Assessment & Plan:   Jesus Curtis was seen today for medical management of chronic issues.  Diagnoses and all orders for this visit:  Type 2 diabetes mellitus without complication, without long-term current use of insulin (HCC) -     Bayer DCA Hb A1c Waived -     atorvastatin (LIPITOR) 80 MG tablet; Take 1 tablet (80 mg total) by mouth daily. -     Microalbumin / creatinine urine ratio  Essential hypertension -     CBC with Differential/Platelet -     CMP14+EGFR -     amLODipine (NORVASC) 10 MG tablet; Take 1 tablet (10 mg total) by mouth daily. -     metoprolol tartrate (LOPRESSOR) 100 MG tablet; Take 1 tablet (100 mg total) by mouth 2 (two) times daily.  Hyperlipidemia associated with type 2 diabetes mellitus ([place]) -     Lipid panel  ASCVD (arteriosclerotic cardiovascular disease) -      atorvastatin (LIPITOR) 80 MG tablet; Take 1 tablet (80 mg total) by mouth daily. -     metoprolol tartrate (LOPRESSOR) 100 MG tablet; Take 1 tablet (100 mg total) by mouth 2 (two) times daily.  Other orders -     valsartan (DIOVAN) 80 MG tablet; Take 1 tablet (80 mg total) by mouth daily. -     dapagliflozin propanediol (FARXIGA) 10 MG TABS tablet; Take 1 tablet (10 mg total) by mouth daily before breakfast.   I have discontinued Traven A. Hosek's valsartan-hydrochlorothiazide. I am also having him start on valsartan and dapagliflozin propanediol. Additionally, I am having him maintain his Multiple Vitamin (ONE-A-DAY MENS PO), acetaminophen, GINSENG PO, aspirin EC, glucose blood, fluticasone, ipratropium, pantoprazole, metFORMIN, amLODipine, atorvastatin, and metoprolol tartrate.  Meds  ordered this encounter  Medications   amLODipine (NORVASC) 10 MG tablet    Sig: Take 1 tablet (10 mg total) by mouth daily.    Dispense:  90 tablet    Refill:  3   atorvastatin (LIPITOR) 80 MG tablet    Sig: Take 1 tablet (80 mg total) by mouth daily.    Dispense:  90 tablet    Refill:  3    Requesting 1 year supply   metoprolol tartrate (LOPRESSOR) 100 MG tablet    Sig: Take 1 tablet (100 mg total) by mouth 2 (two) times daily.    Dispense:  180 tablet    Refill:  3   valsartan (DIOVAN) 80 MG tablet    Sig: Take 1 tablet (80 mg total) by mouth daily.    Dispense:  90 tablet    Refill:  3   dapagliflozin propanediol (FARXIGA) 10 MG TABS tablet    Sig: Take 1 tablet (10 mg total) by mouth daily before breakfast.    Dispense:  90 tablet    Refill:  3     Follow-up: Return in about 3 months (around 04/07/2022).  Claretta Fraise, M.D.

## 2022-01-06 NOTE — Patient Instructions (Signed)
Diabetes Mellitus and Foot Care Foot care is an important part of your health, especially when you have diabetes. Diabetes may cause you to have problems because of poor blood flow (circulation) to your feet and legs, which can cause your skin to: Become thinner and drier. Break more easily. Heal more slowly. Peel and crack. You may also have nerve damage (neuropathy) in your legs and feet, causing decreased feeling in them. This means that you may not notice minor injuries to your feet that could lead to more serious problems. Noticing and addressing any potential problems early is the best way to prevent future foot problems. How to care for your feet Foot hygiene  Wash your feet daily with warm water and mild soap. Do not use hot water. Then, pat your feet and the areas between your toes until they are completely dry. Do not soak your feet as this can dry your skin. Trim your toenails straight across. Do not dig under them or around the cuticle. File the edges of your nails with an emery board or nail file. Apply a moisturizing lotion or petroleum jelly to the skin on your feet and to dry, brittle toenails. Use lotion that does not contain alcohol and is unscented. Do not apply lotion between your toes. Shoes and socks Wear clean socks or stockings every day. Make sure they are not too tight. Do not wear knee-high stockings since they may decrease blood flow to your legs. Wear shoes that fit properly and have enough cushioning. Always look in your shoes before you put them on to be sure there are no objects inside. To break in new shoes, wear them for just a few hours a day. This prevents injuries on your feet. Wounds, scrapes, corns, and calluses  Check your feet daily for blisters, cuts, bruises, sores, and redness. If you cannot see the bottom of your feet, use a mirror or ask someone for help. Do not cut corns or calluses or try to remove them with medicine. If you find a minor scrape,  cut, or break in the skin on your feet, keep it and the skin around it clean and dry. You may clean these areas with mild soap and water. Do not clean the area with peroxide, alcohol, or iodine. If you have a wound, scrape, corn, or callus on your foot, look at it several times a day to make sure it is healing and not infected. Check for: Redness, swelling, or pain. Fluid or blood. Warmth. Pus or a bad smell. General tips Do not cross your legs. This may decrease blood flow to your feet. Do not use heating pads or hot water bottles on your feet. They may burn your skin. If you have lost feeling in your feet or legs, you may not know this is happening until it is too late. Protect your feet from hot and cold by wearing shoes, such as at the beach or on hot pavement. Schedule a complete foot exam at least once a year (annually) or more often if you have foot problems. Report any cuts, sores, or bruises to your health care provider immediately. Where to find more information American Diabetes Association: www.diabetes.org Association of Diabetes Care & Education Specialists: www.diabeteseducator.org Contact a health care provider if: You have a medical condition that increases your risk of infection and you have any cuts, sores, or bruises on your feet. You have an injury that is not healing. You have redness on your legs or feet. You   feel burning or tingling in your legs or feet. You have pain or cramps in your legs and feet. Your legs or feet are numb. Your feet always feel cold. You have pain around any toenails. Get help right away if: You have a wound, scrape, corn, or callus on your foot and: You have pain, swelling, or redness that gets worse. You have fluid or blood coming from the wound, scrape, corn, or callus. Your wound, scrape, corn, or callus feels warm to the touch. You have pus or a bad smell coming from the wound, scrape, corn, or callus. You have a fever. You have a red  line going up your leg. Summary Check your feet every day for blisters, cuts, bruises, sores, and redness. Apply a moisturizing lotion or petroleum jelly to the skin on your feet and to dry, brittle toenails. Wear shoes that fit properly and have enough cushioning. If you have foot problems, report any cuts, sores, or bruises to your health care provider immediately. Schedule a complete foot exam at least once a year (annually) or more often if you have foot problems. This information is not intended to replace advice given to you by your health care provider. Make sure you discuss any questions you have with your health care provider. Document Revised: 10/27/2019 Document Reviewed: 10/27/2019 Elsevier Patient Education  2023 Else[employer] Inc. Carbohydrate Counting for Diabetes Mellitus, Adult Carbohydrate counting is a method of keeping track of how many carbohydrates you eat. Eating carbohydrates increases the amount of sugar (glucose) in the blood. Counting how many carbohydrates you eat improves how well you manage your blood glucose. This, in turn, helps you manage your diabetes. Carbohydrates are measured in grams (g) per serving. It is important to know how many carbohydrates (in grams or by serving size) you can have in each meal. This is different for every person. A dietitian can help you make a meal plan and calculate how many carbohydrates you should have at each meal and snack. What foods contain carbohydrates? Carbohydrates are found in the following foods: Grains, such as breads and cereals. Dried beans and soy products. Starchy vegetables, such as potatoes, peas, and corn. Fruit and fruit juices. Milk and yogurt. Sweets and snack foods, such as cake, cookies, candy, chips, and soft drinks. How do I count carbohydrates in foods? There are two ways to count carbohydrates in food. You can read food labels or learn standard serving sizes of foods. You can use either of these methods or  a combination of both. Using the Nutrition Facts label The Nutrition Facts list is included on the labels of almost all packaged foods and beverages in the [place]. It includes: The serving size. Information about nutrients in each serving, including the grams of carbohydrate per serving. To use the Nutrition Facts, decide how many servings you will have. Then, multiply the number of servings by the number of carbohydrates per serving. The resulting number is the total grams of carbohydrates that you will be having. Learning the standard serving sizes of foods When you eat carbohydrate foods that are not packaged or do not include Nutrition Facts on the label, you need to measure the servings in order to count the grams of carbohydrates. Measure the foods that you will eat with a food scale or measuring cup, if needed. Decide how many standard-size servings you will eat. Multiply the number of servings by 15. For foods that contain carbohydrates, one serving equals 15 g of carbohydrates. For example, if  you eat 2 cups or 10 oz (300 g) of strawberries, you will have eaten 2 servings and 30 g of carbohydrates (2 servings x 15 g = 30 g). For foods that have more than one food mixed, such as soups and casseroles, you must count the carbohydrates in each food that is included. The following list contains standard serving sizes of common carbohydrate-rich foods. Each of these servings has about 15 g of carbohydrates: 1 slice of bread. 1 six-inch (15 cm) tortilla. ? cup or 2 oz (53 g) cooked rice or pasta.  cup or 3 oz (85 g) cooked or canned, drained and rinsed beans or lentils.  cup or 3 oz (85 g) starchy vegetable, such as peas, corn, or squash.  cup or 4 oz (120 g) hot cereal.  cup or 3 oz (85 g) boiled or mashed potatoes, or  or 3 oz (85 g) of a large baked potato.  cup or 4 fl oz (118 mL) fruit juice. 1 cup or 8 fl oz (237 mL) milk. 1 small or 4 oz (106 g) apple.  or 2 oz (63 g)  of a medium banana. 1 cup or 5 oz (150 g) strawberries. 3 cups or 1 oz (28.3 g) popped popcorn. What is an example of carbohydrate counting? To calculate the grams of carbohydrates in this sample meal, follow the steps shown below. Sample meal 3 oz (85 g) chicken breast. ? cup or 4 oz (106 g) brown rice.  cup or 3 oz (85 g) corn. 1 cup or 8 fl oz (237 mL) milk. 1 cup or 5 oz (150 g) strawberries with sugar-free whipped topping. Carbohydrate calculation Identify the foods that contain carbohydrates: Rice. Corn. Milk. Strawberries. Calculate how many servings you have of each food: 2 servings rice. 1 serving corn. 1 serving milk. 1 serving strawberries. Multiply each number of servings by 15 g: 2 servings rice x 15 g = 30 g. 1 serving corn x 15 g = 15 g. 1 serving milk x 15 g = 15 g. 1 serving strawberries x 15 g = 15 g. Add together all of the amounts to find the total grams of carbohydrates eaten: 30 g + 15 g + 15 g + 15 g = 75 g of carbohydrates total. What are tips for following this plan? Shopping Develop a meal plan and then make a shopping list. Buy fresh and frozen vegetables, fresh and frozen fruit, dairy, eggs, beans, lentils, and whole grains. Look at food labels. Choose foods that have more fiber and less sugar. Avoid processed foods and foods with added sugars. Meal planning Aim to have the same number of grams of carbohydrates at each meal and for each snack time. Plan to have regular, balanced meals and snacks. Where to find more information American Diabetes Association: diabetes.org Centers for Disease Control and Prevention: StoreMirror.com.[place] Academy of Nutrition and Dietetics: eatright.org Association of Diabetes Care & Education Specialists: diabeteseducator.org Summary Carbohydrate counting is a method of keeping track of how many carbohydrates you eat. Eating carbohydrates increases the amount of sugar (glucose) in your blood. Counting how many carbohydrates  you eat improves how well you manage your blood glucose. This helps you manage your diabetes. A dietitian can help you make a meal plan and calculate how many carbohydrates you should have at each meal and snack. This information is not intended to replace advice given to you by your health care provider. Make sure you discuss any questions you have with your health care  provider. Document Revised: 11/09/2019 Document Reviewed: 11/09/2019 Elsevier Patient Education  2023 [employer].

## 2022-01-07 LAB — CBC WITH DIFFERENTIAL/PLATELET
Basophils Absolute: 0.1 10*3/uL (ref 0.0–0.2)
Basos: 1 %
EOS (ABSOLUTE): 0.3 10*3/uL (ref 0.0–0.4)
Eos: 4 %
Hematocrit: 38.3 % (ref 37.5–51.0)
Hemoglobin: 12.8 g/dL — ABNORMAL LOW (ref 13.0–17.7)
Immature Grans (Abs): 0 10*3/uL (ref 0.0–0.1)
Immature Granulocytes: 0 %
Lymphocytes Absolute: 2.8 10*3/uL (ref 0.7–3.1)
Lymphs: 37 %
MCH: 31.4 pg (ref 26.6–33.0)
MCHC: 33.4 g/dL (ref 31.5–35.7)
MCV: 94 fL (ref 79–97)
Monocytes Absolute: 0.6 10*3/uL (ref 0.1–0.9)
Monocytes: 8 %
Neutrophils Absolute: 3.9 10*3/uL (ref 1.4–7.0)
Neutrophils: 50 %
Platelets: 257 10*3/uL (ref 150–450)
RBC: 4.08 x10E6/uL — ABNORMAL LOW (ref 4.14–5.80)
RDW: 12.6 % (ref 11.6–15.4)
WBC: 7.6 10*3/uL (ref 3.4–10.8)

## 2022-01-07 LAB — CMP14+EGFR
ALT: 28 IU/L (ref 0–44)
AST: 28 IU/L (ref 0–40)
Albumin/Globulin Ratio: 2 (ref 1.2–2.2)
Albumin: 4.5 g/dL (ref 3.8–4.8)
Alkaline Phosphatase: 84 IU/L (ref 44–121)
BUN/Creatinine Ratio: 10 (ref 10–24)
BUN: 9 mg/dL (ref 8–27)
Bilirubin Total: 0.5 mg/dL (ref 0.0–1.2)
CO2: 18 mmol/L — ABNORMAL LOW (ref 20–29)
Calcium: 9.8 mg/dL (ref 8.6–10.2)
Chloride: 103 mmol/L (ref 96–106)
Creatinine, Ser: 0.92 mg/dL (ref 0.76–1.27)
Globulin, Total: 2.3 g/dL (ref 1.5–4.5)
Glucose: 356 mg/dL — ABNORMAL HIGH (ref 70–99)
Potassium: 5.4 mmol/L — ABNORMAL HIGH (ref 3.5–5.2)
Sodium: 139 mmol/L (ref 134–144)
Total Protein: 6.8 g/dL (ref 6.0–8.5)
eGFR: 85 mL/min/{1.73_m2} (ref >59)

## 2022-01-07 LAB — LIPID PANEL
Chol/HDL Ratio: 2.8 ratio (ref 0.0–5.0)
Cholesterol, Total: 84 mg/dL — ABNORMAL LOW (ref 100–199)
HDL: 30 mg/dL — ABNORMAL LOW (ref >39)
LDL Chol Calc (NIH): 34 mg/dL (ref 0–99)
Triglycerides: 109 mg/dL (ref 0–149)
VLDL Cholesterol Cal: 20 mg/dL (ref 5–40)

## 2022-02-17 ENCOUNTER — Encounter: Payer: Self-pay | Admitting: Family Medicine

## 2022-02-17 ENCOUNTER — Ambulatory Visit (INDEPENDENT_AMBULATORY_CARE_PROVIDER_SITE_OTHER): Payer: Medicare Other | Admitting: Family Medicine

## 2022-02-17 DIAGNOSIS — U071 COVID-19: Secondary | ICD-10-CM | POA: Diagnosis not present

## 2022-02-17 MED ORDER — NIRMATRELVIR/RITONAVIR (PAXLOVID)TABLET: 3.0000 | ORAL_TABLET | Freq: Two times a day (BID) | ORAL | 0 refills | Status: AC

## 2022-02-17 MED ORDER — BENZONATATE 200 MG PO CAPS: 200.0000 mg | ORAL_CAPSULE | Freq: Three times a day (TID) | ORAL | 0 refills | Status: DC | PRN

## 2022-02-17 NOTE — Progress Notes (Signed)
Subjective:    Patient ID: Jesus Curtis, male    DOB: 1940-11-25, 81 y.o.   MRN: [id number]   HPI: Jesus Curtis is a 81 y.o. male presenting for fever to 100.4. Covid Positive. Coughing a lot  and bringing up phlegm. Denies dyspnea. DIabetes - decreased appetite. Glucose 160 last night. 132/85 BP this AM. Onset was 4 days ago.      01/06/2022    8:44 AM 10/03/2021   10:05 AM 07/03/2021    8:06 AM 04/02/2021    8:41 AM 01/03/2021   10:44 AM  Depression screen PHQ 2/9  Decreased Interest 0 0 0 0 0  Down, Depressed, Hopeless 0 0 0 0 0  PHQ - 2 Score 0 0 0 0 0     Relevant past medical, surgical, family and social history reviewed and updated as indicated.  Interim medical history since our last visit reviewed. Allergies and medications reviewed and updated.  ROS:  Review of Systems  Constitutional:  Positive for chills, fatigue and fever.  HENT:  Positive for congestion and rhinorrhea. Negative for sore throat.   Respiratory:  Positive for cough. Negative for shortness of breath and wheezing.   Cardiovascular:  Negative for chest pain.  Musculoskeletal:  Positive for myalgias.  Skin:  Negative for color change and rash.  Neurological:  Negative for dizziness and syncope.     Social History   Tobacco Use  Smoking Status Never  Smokeless Tobacco Never       Objective:     Wt Readings from Last 3 Encounters:  01/06/22 171 lb 3.2 oz (77.7 kg)  10/03/21 177 lb 3.2 oz (80.4 kg)  07/03/21 176 lb 6.4 oz (80 kg)     Exam deferred. Pt. Harboring due to COVID 19. Phone visit performed.   Assessment & Plan:   1. COVID-19 virus infection     Meds ordered this encounter  Medications   nirmatrelvir/ritonavir EUA (PAXLOVID) 20 x 150 MG & 10 x 100MG  TABS    Sig: Take 3 tablets by mouth 2 (two) times daily for 5 days. (Take nirmatrelvir 150 mg two tablets twice daily for 5 days and ritonavir 100 mg one tablet twice daily for 5 days) Patient GFR is 85     Dispense:  30 tablet    Refill:  0   benzonatate (TESSALON) 200 MG capsule    Sig: Take 1 capsule (200 mg total) by mouth 3 (three) times daily as needed for cough.    Dispense:  20 capsule    Refill:  0    No orders of the defined types were placed in this encounter.     Diagnoses and all orders for this visit:  COVID-19 virus infection  Other orders -     nirmatrelvir/ritonavir EUA (PAXLOVID) 20 x 150 MG & 10 x 100MG  TABS; Take 3 tablets by mouth 2 (two) times daily for 5 days. (Take nirmatrelvir 150 mg two tablets twice daily for 5 days and ritonavir 100 mg one tablet twice daily for 5 days) Patient GFR is 85 -     benzonatate (TESSALON) 200 MG capsule; Take 1 capsule (200 mg total) by mouth 3 (three) times daily as needed for cough.    Virtual Visit via telephone Note  I discussed the limitations, risks, security and privacy concerns of performing an evaluation and management service by telephone and the availability of in person appointments. The patient was identified with two identifiers. Pt.expressed understanding and agreed  to proceed. Pt. Is at home. Dr. Darlyn Read is in his office.  Follow Up Instructions:   I discussed the assessment and treatment plan with the patient. The patient was provided an opportunity to ask questions and all were answered. The patient agreed with the plan and demonstrated an understanding of the instructions.   The patient was advised to call back or seek an in-person evaluation if the symptoms worsen or if the condition fails to improve as anticipated.   Total minutes including chart review and phone contact time: 14   Follow up plan: Return if symptoms worsen or fail to improve.  Mechele Claude, MD [place] [facility]

## 2022-02-24 ENCOUNTER — Other Ambulatory Visit: Payer: Self-pay | Admitting: Family Medicine

## 2022-02-25 ENCOUNTER — Encounter: Payer: Self-pay | Admitting: Cardiology

## 2022-02-25 ENCOUNTER — Ambulatory Visit: Payer: Medicare Other | Attending: Cardiology | Admitting: Cardiology

## 2022-02-25 VITALS — BP 130/76 | HR 56 | Ht 69.0 in | Wt 165.8 lb

## 2022-02-25 DIAGNOSIS — I10 Essential (primary) hypertension: Secondary | ICD-10-CM

## 2022-02-25 DIAGNOSIS — I2510 Atherosclerotic heart disease of native coronary artery without angina pectoris: Secondary | ICD-10-CM | POA: Diagnosis not present

## 2022-02-25 DIAGNOSIS — E782 Mixed hyperlipidemia: Secondary | ICD-10-CM | POA: Diagnosis not present

## 2022-02-25 NOTE — Patient Instructions (Signed)

## 2022-02-25 NOTE — Progress Notes (Signed)
Clinical Summary Jesus Curtis is a 81 y.o.male seen for the following medical problems.    1. CAD - 11/2016 cath with severe multivessel disease, referred for CABG - 11/24/16 CABG x4 (left internal mammary artery to     left anterior descending, saphenous vein graft to diagonal,     saphenous vein graft to circumflex marginal, saphenous vein graft     to posterior descending Darrell Hauk of the distal circumflex) - 11/2016 echo LVEF 55-60%, grade II diastolic dysfunction       - no chest pain, no SOB/DOE - compliant with meds     2. HTN - on chlorthalidoen had orthoststic dizzziness, stopped taking    - compliant with meds - home bp's 112/70s - no recent orthostatic symptoms.     3. Hyperlipidemia  07/2019 TC 91 TG 49 HDL 48 LDL 41 11/2020 TC 92 TG 83 HDL 38 LDL 37 - 12/2021 TC 84 TG 109 HDL 30 LDL 34 - he is on atorva 80mg  daily   4. DM2 - followed by pcp   5. COVID + 01/2022 - started on paxlovid by pcp - symptoms have resolved Past Medical History:  Diagnosis Date   Abnormal stress test    Aortic atherosclerosis (HCC)    Chest pain    Elevated hemoglobin A1c    Elevated troponin    Hypertension    Nausea & vomiting    Snake bite    AS A CHILD HAD PMH   SVT (supraventricular tachycardia)    Syncope      Allergies  Allergen Reactions   Ativan [Lorazepam] Other (See Comments)    "goes wild"     Current Outpatient Medications  Medication Sig Dispense Refill   acetaminophen (TYLENOL) 500 MG tablet Take 1,000 mg by mouth every 6 (six) hours as needed for mild pain.     amLODipine (NORVASC) 10 MG tablet Take 1 tablet (10 mg total) by mouth daily. 90 tablet 3   aspirin EC 81 MG tablet Take 81 mg by mouth daily.     atorvastatin (LIPITOR) 80 MG tablet Take 1 tablet (80 mg total) by mouth daily. 90 tablet 3   benzonatate (TESSALON) 200 MG capsule Take 1 capsule (200 mg total) by mouth 3 (three) times daily as needed for cough. 20 capsule 0   dapagliflozin  propanediol (FARXIGA) 10 MG TABS tablet Take 1 tablet (10 mg total) by mouth daily before breakfast. 90 tablet 3   fluticasone (FLONASE) 50 MCG/ACT nasal spray Place 1 spray into both nostrils 2 (two) times daily as needed for allergies or rhinitis. 16 g 6   GINSENG PO Take 2 tablets by mouth daily.     glucose blood (ONE TOUCH ULTRA TEST) test strip Use to check blood sugars up to four times a day 400 each 1   ipratropium (ATROVENT) 0.03 % nasal spray PLACE 2 SPRAYS INTO BOTH NOSTRILS EVERY 12 (TWELVE) HOURS FOR 3 DAYS. 30 mL 0   metFORMIN (GLUCOPHAGE-XR) 500 MG 24 hr tablet Take 2 tablets (1,000 mg total) by mouth daily with breakfast. 180 tablet 3   metoprolol tartrate (LOPRESSOR) 100 MG tablet Take 1 tablet (100 mg total) by mouth 2 (two) times daily. 180 tablet 3   Multiple Vitamin (ONE-A-DAY MENS PO) Take 1 tablet by mouth daily.     pantoprazole (PROTONIX) 40 MG tablet TAKE 1 TABLET BY MOUTH DAILY FOR STOMACH 90 tablet 0   valsartan (DIOVAN) 80 MG tablet Take 1 tablet (  80 mg total) by mouth daily. 90 tablet 3   No current facility-administered medications for this visit.     Past Surgical History:  Procedure Laterality Date   CORONARY ARTERY BYPASS GRAFT N/A 11/24/2016   Procedure: CORONARY ARTERY BYPASS GRAFTING (CABG), ON PUMP, TIMES FOUR, USING LEFT INTERNAL MAMMARY ARTERY AND ENDOSCOPICALLY HARVESTED RIGHT GREATER SAPHENOUS VEIN;  Surgeon: Kerin Perna, MD;  Location: [facility] OR;  Service: Open Heart Surgery;  Laterality: N/A;  LIMA to LAD SVG to PDA SVG to OM1 SVG to Diag1   LEFT HEART CATH AND CORONARY ANGIOGRAPHY N/A 11/21/2016   Procedure: LEFT HEART CATH AND CORONARY ANGIOGRAPHY;  Surgeon: Lennette Bihari, MD;  Location: [facility] INVASIVE CV LAB;  Service: Cardiovascular;  Laterality: N/A;   TEE WITHOUT CARDIOVERSION N/A 11/24/2016   Procedure: TRANSESOPHAGEAL ECHOCARDIOGRAM (TEE);  Surgeon: Donata Clay, Theron Arista, MD;  Location: [facility] OR;  Service: Open Heart Surgery;  Laterality: N/A;      Allergies  Allergen Reactions   Ativan [Lorazepam] Other (See Comments)    "goes wild"      Family History  Problem Relation Age of Onset   Heart attack Mother    Heart disease Mother    Hypertension Mother    Heart attack Father    Heart disease Father    Hypertension Father    Heart attack Brother    Heart disease Brother    Stroke Brother    Stroke Maternal Grandmother    Stroke Paternal Grandmother    Heart disease Paternal Grandfather    Hypertension Paternal Grandfather    Heart attack Brother      Social History Jesus Curtis reports that he has never smoked. He has never used smokeless tobacco. Jesus Curtis reports no history of alcohol use.   Review of Systems CONSTITUTIONAL: No weight loss, fever, chills, weakness or fatigue.  HEENT: Eyes: No visual loss, blurred vision, double vision or yellow sclerae.No hearing loss, sneezing, congestion, runny nose or sore throat.  SKIN: No rash or itching.  CARDIOVASCULAR: per hpi RESPIRATORY: No shortness of breath, cough or sputum.  GASTROINTESTINAL: No anorexia, nausea, vomiting or diarrhea. No abdominal pain or blood.  GENITOURINARY: No burning on urination, no polyuria NEUROLOGICAL: No headache, dizziness, syncope, paralysis, ataxia, numbness or tingling in the extremities. No change in bowel or bladder control.  MUSCULOSKELETAL: No muscle, back pain, joint pain or stiffness.  LYMPHATICS: No enlarged nodes. No history of splenectomy.  PSYCHIATRIC: No history of depression or anxiety.  ENDOCRINOLOGIC: No reports of sweating, cold or heat intolerance. No polyuria or polydipsia.  Marland Kitchen   Physical Examination Today's Vitals   02/25/22 1506  BP: 130/76  Pulse: (!) 56  SpO2: 95%  Weight: 165 lb 12.8 oz (75.2 kg)  Height: 5\' 9"  (1.753 m)   Body mass index is 24.48 kg/m.  Gen: resting comfortably, no acute distress HEENT: no scleral icterus, pupils equal round and reactive, no palptable cervical adenopathy,   CV: RRR, no m/rg, no jvd Resp: Clear to auscultation bilaterally GI: abdomen is soft, non-tender, non-distended, normal bowel sounds, no hepatosplenomegaly MSK: extremities are warm, no edema.  Skin: warm, no rash Neuro:  no focal deficits Psych: appropriate affect   Diagnostic Studies  11/2016 cath   LM lesion, 30 %stenosed. Ost LAD to Prox LAD lesion, 30 %stenosed. Mid LAD lesion, 90 %stenosed. 1st Mrg lesion, 50 %stenosed. Ost Cx to Prox Cx lesion, 70 %stenosed. Prox Cx to Dist Cx lesion, 70 %stenosed. Ost RCA to Prox RCA lesion,  80 %stenosed. Mid RCA lesion, 100 %stenosed. LV end diastolic pressure is normal.     11/2016 echo     - Left ventricle: The cavity size was normal. Systolic function was   normal. The estimated ejection fraction was in the range of 55%   to 60%. Wall motion was normal; there were no regional wall   motion abnormalities. Features are consistent with a pseudonormal   left ventricular filling pattern, with concomitant abnormal   relaxation and increased filling pressure (grade 2 diastolic   dysfunction). Doppler parameters are consistent with elevated   mean left atrial filling pressure. - Left atrium: The atrium was mildly dilated. - Pericardium, extracardiac: A trivial pericardial effusion was   identified.     11/2016 carotid US Findings suggest 1-39% internal carotid artery stenosis bilaterally. Vertebral arteries are patent with antegrade flow.   Assessment and Plan  1. CAD - no recent symptoms, continue current meds   2. HTN -significant orthostatic symptoms with trying chlorthladione last visit, now off and symptoms resolved - bp at goal, continue current meds   3.Hyperlipidemia - he is at goal, continue current meds      F/u 6 months     Arnoldo Lenis, M.D.

## 2022-03-28 NOTE — Progress Notes (Signed)
[facility] Quality Team Note  Name: Jesus Curtis Date of Birth: 02-20-1941 MRN: [id number] Date: 03/28/2022  [facility] Quality Team has reviewed this patient's chart, please see recommendations below:  [facility] Quality Other; (KED: Kidney Health Evaluation Gap- Patient needs Urine Albumin Creatinine Ratio Test completed for gap closure. EGFR has already been completed, Patient has upcoming appointment with [facility] 04/09/2022).

## 2022-04-09 ENCOUNTER — Encounter: Payer: Self-pay | Admitting: Family Medicine

## 2022-04-09 ENCOUNTER — Ambulatory Visit (INDEPENDENT_AMBULATORY_CARE_PROVIDER_SITE_OTHER): Payer: Medicare Other | Admitting: Family Medicine

## 2022-04-09 VITALS — BP 178/79 | HR 60 | Temp 97.6°F | Ht 69.0 in | Wt 168.8 lb

## 2022-04-09 DIAGNOSIS — E1169 Type 2 diabetes mellitus with other specified complication: Secondary | ICD-10-CM | POA: Diagnosis not present

## 2022-04-09 DIAGNOSIS — E785 Hyperlipidemia, unspecified: Secondary | ICD-10-CM | POA: Diagnosis not present

## 2022-04-09 DIAGNOSIS — I10 Essential (primary) hypertension: Secondary | ICD-10-CM | POA: Diagnosis not present

## 2022-04-09 DIAGNOSIS — E119 Type 2 diabetes mellitus without complications: Secondary | ICD-10-CM

## 2022-04-09 MED ORDER — EMPAGLIFLOZIN 25 MG PO TABS: 25.0000 mg | ORAL_TABLET | Freq: Every day | ORAL | 3 refills | Status: DC

## 2022-04-09 NOTE — Progress Notes (Signed)
Subjective:  Patient ID: Jesus Curtis, male    DOB: May 09, 1940  Age: 81 y.o. MRN: [id number]  CC: Medical Management of Chronic Issues   HPI Therron Sells Yiu presents for presents forFollow-up of diabetes. Patient checks blood sugar at home.   130-150 fasting and 150 postprandial. Feeling Fine.' Active bustin' wood to heat the house.  Patient denies symptoms such as polyuria, polydipsia, excessive hunger, nausea No significant hypoglycemic spells noted. Medications reviewed. Pt reports taking them regularly without complication/adverse reaction being  reported today.   Concerned about cost of Farxiga $131 this time.  Lab Results  Component Value Date   HGBA1C 11.5 (H) 01/06/2022   HGBA1C 7.3 (H) 10/03/2021   HGBA1C 7.9 (H) 07/03/2021         04/09/2022   10:07 AM 01/06/2022    8:44 AM 10/03/2021   10:05 AM  Depression screen PHQ 2/9  Decreased Interest 0 0 0  Down, Depressed, Hopeless 0 0 0  PHQ - 2 Score 0 0 0    History Huxton has a past medical history of Abnormal stress test, Aortic atherosclerosis (HCC), Chest pain, Elevated hemoglobin A1c, Elevated troponin, Hypertension, Nausea & vomiting, Snake bite, SVT (supraventricular tachycardia), and Syncope.   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   His family history includes Heart attack in his brother, brother, father, and mother; Heart disease in his brother, father, mother, and paternal grandfather; Hypertension in his father, mother, and paternal grandfather; Stroke in his brother, maternal grandmother, and paternal grandmother.He reports that he has never smoked. He has never used smokeless tobacco. He reports that he does not drink alcohol and does not use drugs.    ROS Review of Systems  Constitutional:  Negative for fever.  Respiratory:  Negative for  shortness of breath.   Cardiovascular:  Negative for chest pain.  Musculoskeletal:  Negative for arthralgias.  Skin:  Negative for rash.    Objective:  BP (!) 178/79   Pulse 60   Temp 97.6 F (36.4 C)   Ht 5' 9" (1.753 m)   Wt 168 lb 12.8 oz (76.6 kg)   SpO2 96%   BMI 24.93 kg/m   BP Readings from Last 3 Encounters:  04/09/22 (!) 178/79  02/25/22 130/76  01/06/22 (!) 145/75    Wt Readings from Last 3 Encounters:  04/09/22 168 lb 12.8 oz (76.6 kg)  02/25/22 165 lb 12.8 oz (75.2 kg)  01/06/22 171 lb 3.2 oz (77.7 kg)     Physical Exam Vitals reviewed.  Constitutional:      Appearance: He is well-developed.  HENT:     Head: Normocephalic and atraumatic.     Right Ear: External ear normal.     Left Ear: External ear normal.     Mouth/Throat:     Pharynx: No oropharyngeal exudate or posterior oropharyngeal erythema.  Eyes:     Pupils: Pupils are equal, round, and reactive to light.  Cardiovascular:     Rate and Rhythm: Normal rate and regular rhythm.     Heart sounds: No murmur heard. Pulmonary:     Effort: No respiratory distress.     Breath sounds: Normal breath sounds.  Musculoskeletal:     Cervical back: Normal range of motion and neck supple.  Neurological:     Mental Status: He is alert and oriented to person, place, and time.  Assessment & Plan:   Alante was seen today for medical management of chronic issues.  Diagnoses and all orders for this visit:  Type 2 diabetes mellitus without complication, without long-term current use of insulin (HCC) -     Bayer DCA Hb A1c Waived  Essential hypertension -     CBC with Differential/Platelet -     CMP14+EGFR  Hyperlipidemia associated with type 2 diabetes mellitus ([place]) -     Lipid panel  Other orders -     empagliflozin (JARDIANCE) 25 MG TABS tablet; Take 1 tablet (25 mg total) by mouth daily before breakfast.       I have discontinued Olander A. Lampi's benzonatate. I am also having him  start on empagliflozin. Additionally, I am having him maintain his Multiple Vitamin (ONE-A-DAY MENS PO), acetaminophen, GINSENG PO, aspirin EC, glucose blood, fluticasone, ipratropium, metFORMIN, amLODipine, atorvastatin, metoprolol tartrate, valsartan, dapagliflozin propanediol, and pantoprazole.  Allergies as of 04/09/2022       Reactions   Ativan [lorazepam] Other (See Comments)   "goes wild"        Medication List        Accurate as of April 09, 2022 10:49 AM. If you have any questions, ask your nurse or doctor.          STOP taking these medications    benzonatate 200 MG capsule Commonly known as: TESSALON Stopped by: Claretta Fraise, MD       TAKE these medications    acetaminophen 500 MG tablet Commonly known as: TYLENOL Take 1,000 mg by mouth every 6 (six) hours as needed for mild pain.   amLODipine 10 MG tablet Commonly known as: NORVASC Take 1 tablet (10 mg total) by mouth daily.   aspirin EC 81 MG tablet Take 81 mg by mouth daily.   atorvastatin 80 MG tablet Commonly known as: LIPITOR Take 1 tablet (80 mg total) by mouth daily.   dapagliflozin propanediol 10 MG Tabs tablet Commonly known as: Farxiga Take 1 tablet (10 mg total) by mouth daily before breakfast.   empagliflozin 25 MG Tabs tablet Commonly known as: Jardiance Take 1 tablet (25 mg total) by mouth daily before breakfast. Started by: Claretta Fraise, MD   fluticasone 50 MCG/ACT nasal spray Commonly known as: FLONASE Place 1 spray into both nostrils 2 (two) times daily as needed for allergies or rhinitis.   GINSENG PO Take 2 tablets by mouth daily.   glucose blood test strip Commonly known as: ONE TOUCH ULTRA TEST Use to check blood sugars up to four times a day   ipratropium 0.03 % nasal spray Commonly known as: ATROVENT PLACE 2 SPRAYS INTO BOTH NOSTRILS EVERY 12 (TWELVE) HOURS FOR 3 DAYS.   metFORMIN 500 MG 24 hr tablet Commonly known as: GLUCOPHAGE-XR Take 2 tablets (1,000  mg total) by mouth daily with breakfast.   metoprolol tartrate 100 MG tablet Commonly known as: LOPRESSOR Take 1 tablet (100 mg total) by mouth 2 (two) times daily.   ONE-A-DAY MENS PO Take 1 tablet by mouth daily.   pantoprazole 40 MG tablet Commonly known as: PROTONIX TAKE 1 TABLET BY MOUTH DAILY FOR STOMACH   valsartan 80 MG tablet Commonly known as: DIOVAN Take 1 tablet (80 mg total) by mouth daily.       He will price Jardiance to see if it costs less. Scrip sent. If needed I can refer to Lottie Dawson for patient assistance program.  A1c down 3 points from 11 to 8.7. I encouraged  him to bring it down to 7.5 by next time with tighter diet, more exercise and continue meds as is.  Follow-up: Return in about 3 months (around 07/09/2022).  Claretta Fraise, M.D.

## 2022-04-10 LAB — CMP14+EGFR
ALT: 26 IU/L (ref 0–44)
AST: 30 IU/L (ref 0–40)
Albumin/Globulin Ratio: 1.8 (ref 1.2–2.2)
Albumin: 4.3 g/dL (ref 3.8–4.8)
Alkaline Phosphatase: 80 IU/L (ref 44–121)
BUN/Creatinine Ratio: 11 (ref 10–24)
BUN: 10 mg/dL (ref 8–27)
Bilirubin Total: 0.5 mg/dL (ref 0.0–1.2)
CO2: 22 mmol/L (ref 20–29)
Calcium: 9.8 mg/dL (ref 8.6–10.2)
Chloride: 105 mmol/L (ref 96–106)
Creatinine, Ser: 0.93 mg/dL (ref 0.76–1.27)
Globulin, Total: 2.4 g/dL (ref 1.5–4.5)
Glucose: 164 mg/dL — ABNORMAL HIGH (ref 70–99)
Potassium: 5 mmol/L (ref 3.5–5.2)
Sodium: 140 mmol/L (ref 134–144)
Total Protein: 6.7 g/dL (ref 6.0–8.5)
eGFR: 84 mL/min/{1.73_m2} (ref >59)

## 2022-04-10 LAB — CBC WITH DIFFERENTIAL/PLATELET
Basophils Absolute: 0.1 10*3/uL (ref 0.0–0.2)
Basos: 1 %
EOS (ABSOLUTE): 0.2 10*3/uL (ref 0.0–0.4)
Eos: 3 %
Hematocrit: 43.3 % (ref 37.5–51.0)
Hemoglobin: 14.6 g/dL (ref 13.0–17.7)
Immature Grans (Abs): 0 10*3/uL (ref 0.0–0.1)
Immature Granulocytes: 0 %
Lymphocytes Absolute: 2.3 10*3/uL (ref 0.7–3.1)
Lymphs: 41 %
MCH: 30.6 pg (ref 26.6–33.0)
MCHC: 33.7 g/dL (ref 31.5–35.7)
MCV: 91 fL (ref 79–97)
Monocytes Absolute: 0.4 10*3/uL (ref 0.1–0.9)
Monocytes: 7 %
Neutrophils Absolute: 2.7 10*3/uL (ref 1.4–7.0)
Neutrophils: 48 %
Platelets: 236 10*3/uL (ref 150–450)
RBC: 4.77 x10E6/uL (ref 4.14–5.80)
RDW: 13 % (ref 11.6–15.4)
WBC: 5.7 10*3/uL (ref 3.4–10.8)

## 2022-04-10 LAB — LIPID PANEL
Chol/HDL Ratio: 2.9 ratio (ref 0.0–5.0)
Cholesterol, Total: 109 mg/dL (ref 100–199)
HDL: 38 mg/dL — ABNORMAL LOW (ref >39)
LDL Chol Calc (NIH): 54 mg/dL (ref 0–99)
Triglycerides: 87 mg/dL (ref 0–149)
VLDL Cholesterol Cal: 17 mg/dL (ref 5–40)

## 2022-04-10 LAB — BAYER DCA HB A1C WAIVED: HB A1C (BAYER DCA - WAIVED): 8.7 % — ABNORMAL HIGH (ref 4.8–5.6)

## 2022-04-21 NOTE — Progress Notes (Signed)
Hello [name],  Your lab result is normal and/or stable.Some minor variations that are not significant are commonly marked abnormal, but do not represent any medical problem for you.  Best regards, Jessamyn Watterson, M.D.

## 2022-05-21 ENCOUNTER — Other Ambulatory Visit: Payer: Self-pay | Admitting: Family Medicine

## 2022-05-21 DIAGNOSIS — E119 Type 2 diabetes mellitus without complications: Secondary | ICD-10-CM

## 2022-06-01 ENCOUNTER — Other Ambulatory Visit: Payer: Self-pay | Admitting: Family Medicine

## 2022-06-18 ENCOUNTER — Other Ambulatory Visit: Payer: Self-pay | Admitting: *Deleted

## 2022-06-25 ENCOUNTER — Telehealth: Payer: Self-pay | Admitting: Family Medicine

## 2022-06-25 NOTE — Telephone Encounter (Signed)
Insurance required it for cost. Can switch back, but will cost hundreds of dollars a month more.  They are essentially the same thing - just made by different companies

## 2022-06-25 NOTE — Telephone Encounter (Signed)
Left message to call back  

## 2022-07-10 ENCOUNTER — Ambulatory Visit (INDEPENDENT_AMBULATORY_CARE_PROVIDER_SITE_OTHER): Payer: Medicare Other | Admitting: Family Medicine

## 2022-07-10 ENCOUNTER — Telehealth: Payer: Self-pay | Admitting: Family Medicine

## 2022-07-10 ENCOUNTER — Encounter: Payer: Self-pay | Admitting: Family Medicine

## 2022-07-10 VITALS — BP 171/78 | HR 59 | Temp 97.8°F | Ht 69.0 in | Wt 168.8 lb

## 2022-07-10 DIAGNOSIS — Z7984 Long term (current) use of oral hypoglycemic drugs: Secondary | ICD-10-CM

## 2022-07-10 DIAGNOSIS — E1169 Type 2 diabetes mellitus with other specified complication: Secondary | ICD-10-CM

## 2022-07-10 DIAGNOSIS — E785 Hyperlipidemia, unspecified: Secondary | ICD-10-CM

## 2022-07-10 DIAGNOSIS — E119 Type 2 diabetes mellitus without complications: Secondary | ICD-10-CM

## 2022-07-10 DIAGNOSIS — I10 Essential (primary) hypertension: Secondary | ICD-10-CM

## 2022-07-10 LAB — BAYER DCA HB A1C WAIVED: HB A1C (BAYER DCA - WAIVED): 8.5 % — ABNORMAL HIGH (ref 4.8–5.6)

## 2022-07-10 LAB — LIPID PANEL

## 2022-07-10 MED ORDER — METFORMIN HCL ER 500 MG PO TB24: 1000.0000 mg | ORAL_TABLET | Freq: Every day | ORAL | 3 refills | Status: DC

## 2022-07-10 MED ORDER — METFORMIN HCL ER 750 MG PO TB24: 1500.0000 mg | ORAL_TABLET | Freq: Every day | ORAL | 3 refills | Status: DC

## 2022-07-10 MED ORDER — PANTOPRAZOLE SODIUM 40 MG PO TBEC: DELAYED_RELEASE_TABLET | ORAL | 3 refills | Status: DC

## 2022-07-10 NOTE — Telephone Encounter (Signed)
RETURNED CALL, NO ANSWER, LEFT MESSAGE TO CALL BACK 

## 2022-07-10 NOTE — Progress Notes (Signed)
Subjective:  Patient ID: Jesus Curtis,  male    DOB: 01/24/1941  Age: 82 y.o.    CC: Medical Management of Chronic Issues   HPI Jesus Curtis presents for  follow-up of hypertension. Patient has no history of headache chest pain or shortness of breath or recent cough. Patient also denies symptoms of TIA such as numbness weakness lateralizing. Patient denies side effects from medication. States taking it regularly. Multiple, Daily ome BP Readings done showing at rest O running 120s/60s  Patient also  in for follow-up of elevated cholesterol. Doing well without complaints on current medication. Denies side effects  including myalgia and arthralgia and nausea. Also in today for liver function testing. Currently no chest pain, shortness of breath or other cardiovascular related symptoms noted.  Follow-up of diabetes. Patient does check blood sugar at home. Readings run between 160 and 200 prandial. Not checking fasting Patient denies symptoms such as excessive hunger or urinary frequency, excessive hunger, nausea No significant hypoglycemic spells noted. Medications reviewed. Pt reports taking tmetformin once a day only. . Pt. denies complication/adverse reaction today.    History Jesus Curtis has a past medical history of Abnormal stress test, Aortic atherosclerosis ([place]), Chest pain, Elevated hemoglobin A1c, Elevated troponin, Hypertension, Nausea & vomiting, Snake bite, SVT (supraventricular tachycardia), and Syncope.   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   His family history includes Heart attack in his brother, brother, father, and mother; Heart disease in his brother, father, mother, and paternal grandfather; Hypertension in his father, mother, and paternal grandfather; Stroke in his brother, maternal grandmother, and paternal  grandmother.He reports that he has never smoked. He has never used smokeless tobacco. He reports that he does not drink alcohol and does not use drugs.  Current Outpatient Medications on File Prior to Visit  Medication Sig Dispense Refill   acetaminophen (TYLENOL) 500 MG tablet Take 1,000 mg by mouth every 6 (six) hours as needed for mild pain.     amLODipine (NORVASC) 10 MG tablet Take 1 tablet (10 mg total) by mouth daily. 90 tablet 3   aspirin EC 81 MG tablet Take 81 mg by mouth daily.     atorvastatin (LIPITOR) 80 MG tablet Take 1 tablet (80 mg total) by mouth daily. 90 tablet 3   dapagliflozin propanediol (FARXIGA) 10 MG TABS tablet Take 1 tablet (10 mg total) by mouth daily before breakfast. 90 tablet 3   empagliflozin (JARDIANCE) 25 MG TABS tablet Take 1 tablet (25 mg total) by mouth daily before breakfast. 90 tablet 3   fluticasone (FLONASE) 50 MCG/ACT nasal spray Place 1 spray into both nostrils 2 (two) times daily as needed for allergies or rhinitis. 16 g 6   GINSENG PO Take 2 tablets by mouth daily.     glucose blood (ONE TOUCH ULTRA TEST) test strip Use to check blood sugars up to four times a day 400 each 1   metoprolol tartrate (LOPRESSOR) 100 MG tablet Take 1 tablet (100 mg total) by mouth 2 (two) times daily. 180 tablet 3   Multiple Vitamin (ONE-A-DAY MENS PO) Take 1 tablet by mouth daily.     valsartan (DIOVAN) 80 MG tablet Take 1 tablet (80 mg total) by mouth daily. 90 tablet 3   ipratropium (ATROVENT) 0.03 % nasal spray PLACE 2 SPRAYS INTO BOTH NOSTRILS EVERY 12 (TWELVE) HOURS FOR 3 DAYS. (Patient  not taking: Reported on 02/25/2022) 30 mL 0   No current facility-administered medications on file prior to visit.    ROS Review of Systems  Constitutional:  Negative for fever.  Respiratory:  Negative for shortness of breath.   Cardiovascular:  Negative for chest pain.  Musculoskeletal:  Negative for arthralgias.  Skin:  Negative for rash.    Objective:  BP (!) 171/78    Pulse (!) 59   Temp 97.8 F (36.6 C)   Ht 5\' 9"  (1.753 m)   Wt 168 lb 12.8 oz (76.6 kg)   SpO2 98%   BMI 24.93 kg/m   BP Readings from Last 3 Encounters:  07/10/22 (!) 171/78  04/09/22 (!) 178/79  02/25/22 130/76    Wt Readings from Last 3 Encounters:  07/10/22 168 lb 12.8 oz (76.6 kg)  04/09/22 168 lb 12.8 oz (76.6 kg)  02/25/22 165 lb 12.8 oz (75.2 kg)     Physical Exam Vitals reviewed.  Constitutional:      Appearance: He is well-developed.  HENT:     Head: Normocephalic and atraumatic.     Right Ear: External ear normal.     Left Ear: External ear normal.     Mouth/Throat:     Pharynx: No oropharyngeal exudate or posterior oropharyngeal erythema.  Eyes:     Pupils: Pupils are equal, round, and reactive to light.  Cardiovascular:     Rate and Rhythm: Normal rate and regular rhythm.     Heart sounds: No murmur heard. Pulmonary:     Effort: No respiratory distress.     Breath sounds: Normal breath sounds.  Musculoskeletal:     Cervical back: Normal range of motion and neck supple.  Neurological:     Mental Status: He is alert and oriented to person, place, and time.     Diabetic Foot Exam - Simple   No data filed     Lab Results  Component Value Date   HGBA1C 8.7 (H) 04/09/2022   HGBA1C 11.5 (H) 01/06/2022   HGBA1C 7.3 (H) 10/03/2021    Assessment & Plan:   Jesus Curtis was seen today for medical management of chronic issues.  Diagnoses and all orders for this visit:  Type 2 diabetes mellitus without complication, without long-term current use of insulin (HCC) -     Bayer DCA Hb A1c Waived -     Discontinue: metFORMIN (GLUCOPHAGE-XR) 500 MG 24 hr tablet; Take 2 tablets (1,000 mg total) by mouth daily with breakfast. -     Microalbumin / creatinine urine ratio -     metFORMIN (GLUCOPHAGE-XR) 750 MG 24 hr tablet; Take 2 tablets (1,500 mg total) by mouth daily with breakfast.  Essential hypertension -     CBC with Differential/Platelet -      CMP14+EGFR  Hyperlipidemia associated with type 2 diabetes mellitus ([place]) -     Lipid panel  Other orders -     pantoprazole (PROTONIX) 40 MG tablet; TAKE 1 TABLET BY MOUTH DAILY FOR STOMACH   I have discontinued Jesus Curtis's metFORMIN. I have also changed his metFORMIN. Additionally, I am having him maintain his Multiple Vitamin (ONE-A-DAY MENS PO), acetaminophen, GINSENG PO, aspirin EC, glucose blood, fluticasone, ipratropium, amLODipine, atorvastatin, metoprolol tartrate, valsartan, dapagliflozin propanediol, empagliflozin, and pantoprazole.  Meds ordered this encounter  Medications   DISCONTD: metFORMIN (GLUCOPHAGE-XR) 500 MG 24 hr tablet    Sig: Take 2 tablets (1,000 mg total) by mouth daily with breakfast.    Dispense:  180 tablet  Refill:  3    Requesting 1 year supply   pantoprazole (PROTONIX) 40 MG tablet    Sig: TAKE 1 TABLET BY MOUTH DAILY FOR STOMACH    Dispense:  90 tablet    Refill:  3    Please send a replace/new response with 90-Day Supply if appropriate to maximize member benefit.   metFORMIN (GLUCOPHAGE-XR) 750 MG 24 hr tablet    Sig: Take 2 tablets (1,500 mg total) by mouth daily with breakfast.    Dispense:  180 tablet    Refill:  3    Replaces Metformin ER 500. Please cancel that scrip , sent earlier today, and all potential refills     Follow-up: Return in about 3 months (around 10/10/2022).  Claretta Fraise, M.D.

## 2022-07-11 LAB — CBC WITH DIFFERENTIAL/PLATELET
Basophils Absolute: 0 10*3/uL (ref 0.0–0.2)
Basos: 1 %
EOS (ABSOLUTE): 0.2 10*3/uL (ref 0.0–0.4)
Eos: 3 %
Hematocrit: 42.7 % (ref 37.5–51.0)
Hemoglobin: 14.3 g/dL (ref 13.0–17.7)
Immature Grans (Abs): 0 10*3/uL (ref 0.0–0.1)
Immature Granulocytes: 0 %
Lymphocytes Absolute: 2.6 10*3/uL (ref 0.7–3.1)
Lymphs: 44 %
MCH: 30.6 pg (ref 26.6–33.0)
MCHC: 33.5 g/dL (ref 31.5–35.7)
MCV: 91 fL (ref 79–97)
Monocytes Absolute: 0.4 10*3/uL (ref 0.1–0.9)
Monocytes: 7 %
Neutrophils Absolute: 2.5 10*3/uL (ref 1.4–7.0)
Neutrophils: 45 %
Platelets: 217 10*3/uL (ref 150–450)
RBC: 4.68 x10E6/uL (ref 4.14–5.80)
RDW: 14 % (ref 11.6–15.4)
WBC: 5.7 10*3/uL (ref 3.4–10.8)

## 2022-07-11 LAB — LIPID PANEL
Chol/HDL Ratio: 2.6 ratio (ref 0.0–5.0)
Cholesterol, Total: 102 mg/dL (ref 100–199)
HDL: 39 mg/dL — ABNORMAL LOW (ref >39)
LDL Chol Calc (NIH): 46 mg/dL (ref 0–99)
Triglycerides: 82 mg/dL (ref 0–149)
VLDL Cholesterol Cal: 17 mg/dL (ref 5–40)

## 2022-07-11 LAB — CMP14+EGFR
ALT: 24 IU/L (ref 0–44)
AST: 24 IU/L (ref 0–40)
Albumin/Globulin Ratio: 1.9 (ref 1.2–2.2)
Albumin: 4.4 g/dL (ref 3.8–4.8)
Alkaline Phosphatase: 85 IU/L (ref 44–121)
BUN/Creatinine Ratio: 8 — ABNORMAL LOW (ref 10–24)
BUN: 9 mg/dL (ref 8–27)
Bilirubin Total: 0.5 mg/dL (ref 0.0–1.2)
CO2: 19 mmol/L — ABNORMAL LOW (ref 20–29)
Calcium: 9.7 mg/dL (ref 8.6–10.2)
Chloride: 106 mmol/L (ref 96–106)
Creatinine, Ser: 1.06 mg/dL (ref 0.76–1.27)
Globulin, Total: 2.3 g/dL (ref 1.5–4.5)
Glucose: 197 mg/dL — ABNORMAL HIGH (ref 70–99)
Potassium: 5.5 mmol/L — ABNORMAL HIGH (ref 3.5–5.2)
Sodium: 142 mmol/L (ref 134–144)
Total Protein: 6.7 g/dL (ref 6.0–8.5)
eGFR: 71 mL/min/{1.73_m2} (ref >59)

## 2022-07-14 NOTE — Progress Notes (Signed)
Hello Itai,  Your lab result is normal and/or stable.Some minor variations that are not significant are commonly marked abnormal, but do not represent any medical problem for you.  Best regards, Sylvana Bonk, M.D.

## 2022-07-14 NOTE — Telephone Encounter (Signed)
Patient return call. ?

## 2022-07-14 NOTE — Telephone Encounter (Signed)
Patients wife called back. She just wanted to confirm that Dr Livia Snellen did switch his Metformin from him taking 1000mg  at breakfast to taking 1500mg  at breakfast.  I confirmed with wife that per Dr Livia Snellen OV notes and per patients medication list, that is correct. Patient is to take 2 tablets of Metformin 750mg  at breakfast, daily. Wife voiced understanding.

## 2022-07-14 NOTE — Telephone Encounter (Signed)
Returned call, no answer, no option to leave message ?

## 2022-09-09 ENCOUNTER — Ambulatory Visit: Payer: Medicare Other | Attending: Cardiology | Admitting: Cardiology

## 2022-09-09 VITALS — BP 160/70 | HR 60 | Ht 68.0 in | Wt 173.6 lb

## 2022-09-09 DIAGNOSIS — E782 Mixed hyperlipidemia: Secondary | ICD-10-CM

## 2022-09-09 DIAGNOSIS — I10 Essential (primary) hypertension: Secondary | ICD-10-CM

## 2022-09-09 DIAGNOSIS — I2510 Atherosclerotic heart disease of native coronary artery without angina pectoris: Secondary | ICD-10-CM | POA: Diagnosis not present

## 2022-09-09 MED ORDER — CARVEDILOL 25 MG PO TABS: 25.0000 mg | ORAL_TABLET | Freq: Two times a day (BID) | ORAL | 1 refills | Status: DC

## 2022-09-09 NOTE — Patient Instructions (Addendum)
Medication Instructions:   Stop Metoprolol Tart (Lopressor) Begin Coreg 25mg  twice a day   Continue all other medications.     Labwork:  none  Testing/Procedures:  none  Follow-Up:  6 months   Any Other Special Instructions Will Be Listed Below (If Applicable).   If you need a refill on your cardiac medications before your next appointment, please call your pharmacy.

## 2022-09-09 NOTE — Progress Notes (Signed)
Clinical Summary Mr. Keck is a 82 y.o.male seen for the following medical problems.    1. CAD - 11/2016 cath with severe multivessel disease, referred for CABG - 11/24/16 CABG x4 (left internal mammary artery to     left anterior descending, saphenous vein graft to diagonal,     saphenous vein graft to circumflex marginal, saphenous vein graft     to posterior descending Tangee Marszalek of the distal circumflex) - 11/2016 echo LVEF 55-60%, grade II diastolic dysfunction       - no chest pains, no SOB/DOE - compliant with meds     2. HTN - on chlorthalidone had orthoststic dizzziness, stopped taking   - compliant with meds - home bp's 120s-150s/60s-70s - denies any orthostatic symptoms. K was 5.5 on last check    3. Hyperlipidemia  07/2019 TC 91 TG 49 HDL 48 LDL 41 11/2020 TC 92 TG 83 HDL 38 LDL 37 - 12/2021 TC 84 TG 109 HDL 30 LDL 34 - 06/2022 TC 409 TG 82 HDL 39 LDL 46 - he is on atorva 80mg  daily   4. DM2 - followed by pcp  Past Medical History:  Diagnosis Date   Abnormal stress test    Aortic atherosclerosis (HCC)    Chest pain    Elevated hemoglobin A1c    Elevated troponin    Hypertension    Nausea & vomiting    Snake bite    AS A CHILD HAD PMH   SVT (supraventricular tachycardia)    Syncope      Allergies  Allergen Reactions   Ativan [Lorazepam] Other (See Comments)    "goes wild"     Current Outpatient Medications  Medication Sig Dispense Refill   acetaminophen (TYLENOL) 500 MG tablet Take 1,000 mg by mouth every 6 (six) hours as needed for mild pain.     amLODipine (NORVASC) 10 MG tablet Take 1 tablet (10 mg total) by mouth daily. 90 tablet 3   aspirin EC 81 MG tablet Take 81 mg by mouth daily.     atorvastatin (LIPITOR) 80 MG tablet Take 1 tablet (80 mg total) by mouth daily. 90 tablet 3   empagliflozin (JARDIANCE) 25 MG TABS tablet Take 1 tablet (25 mg total) by mouth daily before breakfast. 90 tablet 3   fluticasone (FLONASE) 50 MCG/ACT nasal  spray Place 1 spray into both nostrils 2 (two) times daily as needed for allergies or rhinitis. 16 g 6   GINSENG PO Take 2 tablets by mouth daily.     glucose blood (ONE TOUCH ULTRA TEST) test strip Use to check blood sugars up to four times a day 400 each 1   ipratropium (ATROVENT) 0.03 % nasal spray PLACE 2 SPRAYS INTO BOTH NOSTRILS EVERY 12 (TWELVE) HOURS FOR 3 DAYS. (Patient not taking: Reported on 02/25/2022) 30 mL 0   metFORMIN (GLUCOPHAGE-XR) 750 MG 24 hr tablet Take 2 tablets (1,500 mg total) by mouth daily with breakfast. 180 tablet 3   metoprolol tartrate (LOPRESSOR) 100 MG tablet Take 1 tablet (100 mg total) by mouth 2 (two) times daily. 180 tablet 3   Multiple Vitamin (ONE-A-DAY MENS PO) Take 1 tablet by mouth daily.     pantoprazole (PROTONIX) 40 MG tablet TAKE 1 TABLET BY MOUTH DAILY FOR STOMACH 90 tablet 3   valsartan (DIOVAN) 80 MG tablet Take 1 tablet (80 mg total) by mouth daily. 90 tablet 3   No current facility-administered medications for this visit.  Past Surgical History:  Procedure Laterality Date   CORONARY ARTERY BYPASS GRAFT N/A 11/24/2016   Procedure: CORONARY ARTERY BYPASS GRAFTING (CABG), ON PUMP, TIMES FOUR, USING LEFT INTERNAL MAMMARY ARTERY AND ENDOSCOPICALLY HARVESTED RIGHT GREATER SAPHENOUS VEIN;  Surgeon: Kerin Perna, MD;  Location: [facility] OR;  Service: Open Heart Surgery;  Laterality: N/A;  LIMA to LAD SVG to PDA SVG to OM1 SVG to Diag1   LEFT HEART CATH AND CORONARY ANGIOGRAPHY N/A 11/21/2016   Procedure: LEFT HEART CATH AND CORONARY ANGIOGRAPHY;  Surgeon: Lennette Bihari, MD;  Location: [facility] INVASIVE CV LAB;  Service: Cardiovascular;  Laterality: N/A;   TEE WITHOUT CARDIOVERSION N/A 11/24/2016   Procedure: TRANSESOPHAGEAL ECHOCARDIOGRAM (TEE);  Surgeon: Donata Clay, Theron Arista, MD;  Location: [facility] OR;  Service: Open Heart Surgery;  Laterality: N/A;     Allergies  Allergen Reactions   Ativan [Lorazepam] Other (See Comments)    "goes wild"      Family  History  Problem Relation Age of Onset   Heart attack Mother    Heart disease Mother    Hypertension Mother    Heart attack Father    Heart disease Father    Hypertension Father    Heart attack Brother    Heart disease Brother    Stroke Brother    Stroke Maternal Grandmother    Stroke Paternal Grandmother    Heart disease Paternal Grandfather    Hypertension Paternal Grandfather    Heart attack Brother      Social History Mr. Hagee reports that he has never smoked. He has never used smokeless tobacco. Mr. Jha reports no history of alcohol use.   Review of Systems CONSTITUTIONAL: No weight loss, fever, chills, weakness or fatigue.  HEENT: Eyes: No visual loss, blurred vision, double vision or yellow sclerae.No hearing loss, sneezing, congestion, runny nose or sore throat.  SKIN: No rash or itching.  CARDIOVASCULAR: per hpi RESPIRATORY: No shortness of breath, cough or sputum.  GASTROINTESTINAL: No anorexia, nausea, vomiting or diarrhea. No abdominal pain or blood.  GENITOURINARY: No burning on urination, no polyuria NEUROLOGICAL: No headache, dizziness, syncope, paralysis, ataxia, numbness or tingling in the extremities. No change in bowel or bladder control.  MUSCULOSKELETAL: No muscle, back pain, joint pain or stiffness.  LYMPHATICS: No enlarged nodes. No history of splenectomy.  PSYCHIATRIC: No history of depression or anxiety.  ENDOCRINOLOGIC: No reports of sweating, cold or heat intolerance. No polyuria or polydipsia.  Marland Kitchen   Physical Examination Today's Vitals   09/09/22 0948  BP: (!) 168/98  Pulse: 60  SpO2: 98%  Weight: 173 lb 9.6 oz (78.7 kg)  Height: 5\' 8"  (1.727 m)   Body mass index is 26.4 kg/m.  Gen: resting comfortably, no acute distress HEENT: no scleral icterus, pupils equal round and reactive, no palptable cervical adenopathy,  CV: RRR, no mrg, no jvd Resp: Clear to auscultation bilaterally GI: abdomen is soft, non-tender, non-distended,  normal bowel sounds, no hepatosplenomegaly MSK: extremities are warm, no edema.  Skin: warm, no rash Neuro:  no focal deficits Psych: appropriate affect   Diagnostic Studies  11/2016 cath   LM lesion, 30 %stenosed. Ost LAD to Prox LAD lesion, 30 %stenosed. Mid LAD lesion, 90 %stenosed. 1st Mrg lesion, 50 %stenosed. Ost Cx to Prox Cx lesion, 70 %stenosed. Prox Cx to Dist Cx lesion, 70 %stenosed. Ost RCA to Prox RCA lesion, 80 %stenosed. Mid RCA lesion, 100 %stenosed. LV end diastolic pressure is normal.     11/2016 echo     -  Left ventricle: The cavity size was normal. Systolic function was   normal. The estimated ejection fraction was in the range of 55%   to 60%. Wall motion was normal; there were no regional wall   motion abnormalities. Features are consistent with a pseudonormal   left ventricular filling pattern, with concomitant abnormal   relaxation and increased filling pressure (grade 2 diastolic   dysfunction). Doppler parameters are consistent with elevated   mean left atrial filling pressure. - Left atrium: The atrium was mildly dilated. - Pericardium, extracardiac: A trivial pericardial effusion was   identified.     11/2016 carotid US Findings suggest 1-39% internal carotid artery stenosis bilaterally. Vertebral arteries are patent with antegrade flow.       Assessment and Plan  1. CAD - denies any significant symptoms, continue current meds   2. HTN - bp above goal -significant orthostatic symptoms with trying chlorthladione, avoiding thiazides - last K 5.5, tends to run in low to mid 5's. Would not titrate his ARB or add aldactone - will change lopressor to coreg 25mg  bid for more potent bp effects    3.Hyperlipidemia - at goal, continue current meds      Antoine Poche, M.D

## 2022-10-13 ENCOUNTER — Ambulatory Visit (INDEPENDENT_AMBULATORY_CARE_PROVIDER_SITE_OTHER): Payer: Medicare Other | Admitting: Family Medicine

## 2022-10-13 ENCOUNTER — Encounter: Payer: Self-pay | Admitting: Family Medicine

## 2022-10-13 VITALS — BP 211/94 | HR 58 | Temp 97.1°F | Ht 68.0 in | Wt 173.0 lb

## 2022-10-13 DIAGNOSIS — I152 Hypertension secondary to endocrine disorders: Secondary | ICD-10-CM | POA: Diagnosis not present

## 2022-10-13 DIAGNOSIS — E1159 Type 2 diabetes mellitus with other circulatory complications: Secondary | ICD-10-CM

## 2022-10-13 DIAGNOSIS — I2510 Atherosclerotic heart disease of native coronary artery without angina pectoris: Secondary | ICD-10-CM

## 2022-10-13 DIAGNOSIS — E119 Type 2 diabetes mellitus without complications: Secondary | ICD-10-CM | POA: Diagnosis not present

## 2022-10-13 DIAGNOSIS — Z7984 Long term (current) use of oral hypoglycemic drugs: Secondary | ICD-10-CM

## 2022-10-13 DIAGNOSIS — I10 Essential (primary) hypertension: Secondary | ICD-10-CM

## 2022-10-13 DIAGNOSIS — E785 Hyperlipidemia, unspecified: Secondary | ICD-10-CM | POA: Diagnosis not present

## 2022-10-13 DIAGNOSIS — E1169 Type 2 diabetes mellitus with other specified complication: Secondary | ICD-10-CM | POA: Insufficient documentation

## 2022-10-13 LAB — BAYER DCA HB A1C WAIVED: HB A1C (BAYER DCA - WAIVED): 7.9 % — ABNORMAL HIGH (ref 4.8–5.6)

## 2022-10-13 MED ORDER — AMLODIPINE BESYLATE 10 MG PO TABS
10.0000 mg | ORAL_TABLET | Freq: Every day | ORAL | 3 refills | Status: DC
Start: 2022-10-13 — End: 2022-12-26

## 2022-10-13 MED ORDER — EMPAGLIFLOZIN 25 MG PO TABS: 25.0000 mg | ORAL_TABLET | Freq: Every day | ORAL | 3 refills | Status: DC

## 2022-10-13 MED ORDER — METOPROLOL TARTRATE 100 MG PO TABS: 100.0000 mg | ORAL_TABLET | Freq: Two times a day (BID) | ORAL | 3 refills | Status: DC

## 2022-10-13 MED ORDER — VALSARTAN 160 MG PO TABS: 160.0000 mg | ORAL_TABLET | Freq: Every day | ORAL | 1 refills | Status: DC

## 2022-10-13 MED ORDER — ATORVASTATIN CALCIUM 80 MG PO TABS: 80.0000 mg | ORAL_TABLET | Freq: Every day | ORAL | 2 refills | Status: DC

## 2022-10-13 NOTE — Progress Notes (Signed)
Subjective:  Patient ID: Jesus Curtis,  male    DOB: October 26, 1940  Age: 82 y.o.    CC: Medical Management of Chronic Issues   HPI Jesus Curtis presents for  follow-up of hypertension. Patient has no history of headache chest pain or shortness of breath or recent cough. Patient also denies symptoms of TIA such as numbness weakness lateralizing. Patient denies side effects from medication. States taking it regularly. Stopped taking amlodipine because it wasn't working anyway. Stopped taking Carvedilol because it made him swimmy headed and gave him a constant HA. Gave it two separate 1 week trials with the same result, Went back to his labetalol. Continues the valsartan.  Patient also  in for follow-up of elevated cholesterol. Doing well without complaints on current medication. Denies side effects  including myalgia and arthralgia and nausea. Also in today for liver function testing. Currently no chest pain, shortness of breath or other cardiovascular related symptoms noted.  Follow-up of diabetes. Patient does not check blood sugar at home.  Patient denies symptoms such as excessive hunger or urinary frequency, excessive hunger, nausea No significant hypoglycemic spells noted. Medications reviewed. Not taking Jardiance. Doesn't remember why or if he ever got it filled.    History Jesus Curtis has a past medical history of Abnormal stress test, Aortic atherosclerosis (HCC), Chest pain, Elevated hemoglobin A1c, Elevated troponin, Hypertension, Nausea & vomiting, Snake bite, SVT (supraventricular tachycardia), and Syncope.   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   His family history includes Heart attack in his brother, brother, father, and mother; Heart disease in his brother, father, mother, and paternal grandfather; Hypertension in  his father, mother, and paternal grandfather; Stroke in his brother, maternal grandmother, and paternal grandmother.He reports that he has never smoked. He has never used smokeless tobacco. He reports that he does not drink alcohol and does not use drugs.  Current Outpatient Medications on File Prior to Visit  Medication Sig Dispense Refill   acetaminophen (TYLENOL) 500 MG tablet Take 1,000 mg by mouth every 6 (six) hours as needed for mild pain.     aspirin EC 81 MG tablet Take 81 mg by mouth daily.     GINSENG PO Take 2 tablets by mouth daily.     glucose blood (ONE TOUCH ULTRA TEST) test strip Use to check blood sugars up to four times a day 400 each 1   metFORMIN (GLUCOPHAGE-XR) 750 MG 24 hr tablet Take 2 tablets (1,500 mg total) by mouth daily with breakfast. 180 tablet 3   Multiple Vitamin (ONE-A-DAY MENS PO) Take 1 tablet by mouth daily.     pantoprazole (PROTONIX) 40 MG tablet TAKE 1 TABLET BY MOUTH DAILY FOR STOMACH 90 tablet 3   No current facility-administered medications on file prior to visit.    ROS Review of Systems  Constitutional: Negative.   HENT: Negative.    Eyes:  Negative for visual disturbance.  Respiratory:  Negative for cough and shortness of breath.   Cardiovascular:  Negative for chest pain and leg swelling.  Gastrointestinal:  Negative for abdominal pain, diarrhea, nausea and vomiting.  Genitourinary:  Negative for difficulty urinating.  Musculoskeletal:  Negative for arthralgias and myalgias.  Skin:  Negative for rash.  Neurological:  Negative for headaches.  Psychiatric/Behavioral:  Negative for sleep disturbance.     Objective:  BP (!) 211/94   Pulse Marland Kitchen)  58   Temp (!) 97.1 F (36.2 C)   Ht 5\' 8"  (1.727 m)   Wt 173 lb (78.5 kg)   SpO2 100%   BMI 26.30 kg/m   BP Readings from Last 3 Encounters:  10/13/22 (!) 211/94  09/09/22 (!) 160/70  07/10/22 (!) 171/78    Wt Readings from Last 3 Encounters:  10/13/22 173 lb (78.5 kg)  09/09/22 173 lb  9.6 oz (78.7 kg)  07/10/22 168 lb 12.8 oz (76.6 kg)     Physical Exam Vitals reviewed.  Constitutional:      Appearance: He is well-developed.  HENT:     Head: Normocephalic and atraumatic.     Right Ear: External ear normal.     Left Ear: External ear normal.     Mouth/Throat:     Pharynx: No oropharyngeal exudate or posterior oropharyngeal erythema.  Eyes:     Pupils: Pupils are equal, round, and reactive to light.  Cardiovascular:     Rate and Rhythm: Normal rate and regular rhythm.     Heart sounds: No murmur heard. Pulmonary:     Effort: No respiratory distress.     Breath sounds: Normal breath sounds.  Musculoskeletal:     Cervical back: Normal range of motion and neck supple.  Neurological:     Mental Status: He is alert and oriented to person, place, and time.     Diabetic Foot Exam - Simple   No data filed     Lab Results  Component Value Date   HGBA1C 7.9 (H) 10/13/2022   HGBA1C 8.5 (H) 07/10/2022   HGBA1C 8.7 (H) 04/09/2022    Assessment & Plan:   Jesus Curtis was seen today for medical management of chronic issues.  Diagnoses and all orders for this visit:  High blood pressure associated with diabetes (HCC) -     Bayer DCA Hb A1c Waived -     Microalbumin / creatinine urine ratio -     atorvastatin (LIPITOR) 80 MG tablet; Take 1 tablet (80 mg total) by mouth daily.  Essential hypertension -     CBC with Differential/Platelet -     CMP14+EGFR -     amLODipine (NORVASC) 10 MG tablet; Take 1 tablet (10 mg total) by mouth daily.  Hyperlipidemia associated with type 2 diabetes mellitus (HCC) -     Lipid panel  ASCVD (arteriosclerotic cardiovascular disease) -     atorvastatin (LIPITOR) 80 MG tablet; Take 1 tablet (80 mg total) by mouth daily.  Other orders -     valsartan (DIOVAN) 160 MG tablet; Take 1 tablet (160 mg total) by mouth daily. -     metoprolol tartrate (LOPRESSOR) 100 MG tablet; Take 1 tablet (100 mg total) by mouth 2 (two) times daily. -      empagliflozin (JARDIANCE) 25 MG TABS tablet; Take 1 tablet (25 mg total) by mouth daily before breakfast.   I have discontinued Jesus Curtis's fluticasone, ipratropium, and carvedilol. I have also changed his valsartan and metoprolol tartrate. Additionally, I am having him maintain his Multiple Vitamin (ONE-A-DAY MENS PO), acetaminophen, GINSENG PO, aspirin EC, glucose blood, pantoprazole, metFORMIN, atorvastatin, amLODipine, and empagliflozin.  Meds ordered this encounter  Medications   atorvastatin (LIPITOR) 80 MG tablet    Sig: Take 1 tablet (80 mg total) by mouth daily.    Dispense:  90 tablet    Refill:  2    Requesting 1 year supply   valsartan (DIOVAN) 160 MG tablet    Sig:  Take 1 tablet (160 mg total) by mouth daily.    Dispense:  90 tablet    Refill:  1   amLODipine (NORVASC) 10 MG tablet    Sig: Take 1 tablet (10 mg total) by mouth daily.    Dispense:  90 tablet    Refill:  3   metoprolol tartrate (LOPRESSOR) 100 MG tablet    Sig: Take 1 tablet (100 mg total) by mouth 2 (two) times daily.    Dispense:  180 tablet    Refill:  3   empagliflozin (JARDIANCE) 25 MG TABS tablet    Sig: Take 1 tablet (25 mg total) by mouth daily before breakfast.    Dispense:  90 tablet    Refill:  3     Follow-up: Return in about 2 weeks (around 10/27/2022).  Mechele Claude, M.D.

## 2022-10-14 LAB — CBC WITH DIFFERENTIAL/PLATELET
Basophils Absolute: 0.1 10*3/uL (ref 0.0–0.2)
Basos: 1 %
EOS (ABSOLUTE): 0.2 10*3/uL (ref 0.0–0.4)
Eos: 3 %
Hematocrit: 44.6 % (ref 37.5–51.0)
Hemoglobin: 14.9 g/dL (ref 13.0–17.7)
Immature Grans (Abs): 0 10*3/uL (ref 0.0–0.1)
Immature Granulocytes: 0 %
Lymphocytes Absolute: 2.5 10*3/uL (ref 0.7–3.1)
Lymphs: 40 %
MCH: 30 pg (ref 26.6–33.0)
MCHC: 33.4 g/dL (ref 31.5–35.7)
MCV: 90 fL (ref 79–97)
Monocytes Absolute: 0.5 10*3/uL (ref 0.1–0.9)
Monocytes: 7 %
Neutrophils Absolute: 3.1 10*3/uL (ref 1.4–7.0)
Neutrophils: 49 %
Platelets: 255 10*3/uL (ref 150–450)
RBC: 4.96 x10E6/uL (ref 4.14–5.80)
RDW: 12.6 % (ref 11.6–15.4)
WBC: 6.3 10*3/uL (ref 3.4–10.8)

## 2022-10-14 LAB — CMP14+EGFR
ALT: 25 IU/L (ref 0–44)
AST: 21 IU/L (ref 0–40)
Albumin: 4.7 g/dL (ref 3.8–4.8)
Alkaline Phosphatase: 82 IU/L (ref 44–121)
BUN/Creatinine Ratio: 8 — ABNORMAL LOW (ref 10–24)
BUN: 7 mg/dL — ABNORMAL LOW (ref 8–27)
Bilirubin Total: 0.6 mg/dL (ref 0.0–1.2)
CO2: 21 mmol/L (ref 20–29)
Calcium: 9.8 mg/dL (ref 8.6–10.2)
Chloride: 102 mmol/L (ref 96–106)
Creatinine, Ser: 0.87 mg/dL (ref 0.76–1.27)
Globulin, Total: 2.2 g/dL (ref 1.5–4.5)
Glucose: 200 mg/dL — ABNORMAL HIGH (ref 70–99)
Potassium: 4.8 mmol/L (ref 3.5–5.2)
Sodium: 138 mmol/L (ref 134–144)
Total Protein: 6.9 g/dL (ref 6.0–8.5)
eGFR: 87 mL/min/{1.73_m2} (ref >59)

## 2022-10-14 LAB — LIPID PANEL
Chol/HDL Ratio: 2.3 ratio (ref 0.0–5.0)
Cholesterol, Total: 98 mg/dL — ABNORMAL LOW (ref 100–199)
HDL: 42 mg/dL (ref >39)
LDL Chol Calc (NIH): 38 mg/dL (ref 0–99)
Triglycerides: 96 mg/dL (ref 0–149)
VLDL Cholesterol Cal: 18 mg/dL (ref 5–40)

## 2022-10-14 NOTE — Progress Notes (Signed)
Hello Ryver,  Your lab result is normal and/or stable.Some minor variations that are not significant are commonly marked abnormal, but do not represent any medical problem for you.  Best regards, Mavin Dyke, M.D.

## 2022-10-29 ENCOUNTER — Encounter: Payer: Self-pay | Admitting: Family Medicine

## 2022-10-29 ENCOUNTER — Ambulatory Visit (INDEPENDENT_AMBULATORY_CARE_PROVIDER_SITE_OTHER): Payer: Medicare Other | Admitting: Family Medicine

## 2022-10-29 VITALS — BP 135/74 | HR 60 | Temp 97.1°F | Ht 68.0 in | Wt 171.4 lb

## 2022-10-29 DIAGNOSIS — E119 Type 2 diabetes mellitus without complications: Secondary | ICD-10-CM

## 2022-10-29 DIAGNOSIS — I10 Essential (primary) hypertension: Secondary | ICD-10-CM | POA: Diagnosis not present

## 2022-10-29 DIAGNOSIS — Z7984 Long term (current) use of oral hypoglycemic drugs: Secondary | ICD-10-CM

## 2022-10-29 NOTE — Progress Notes (Signed)
Subjective:  Patient ID: Jesus Curtis, male    DOB: Jan 27, 1941  Age: 82 y.o. MRN: [id number]  CC: Follow-up   HPI Jesus Curtis presents for  follow-up of hypertension. Patient has no history of headache chest pain or shortness of breath or recent cough. Patient also denies symptoms of TIA such as focal numbness or weakness. Patient denies side effects from medication. States taking it regularly.   History Jesus Curtis has a past medical history of Abnormal stress test, Aortic atherosclerosis (HCC), Chest pain, Elevated hemoglobin A1c, Elevated troponin, Hypertension, Nausea & vomiting, Snake bite, SVT (supraventricular tachycardia), and Syncope.   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   His family history includes Heart attack in his brother, brother, father, and mother; Heart disease in his brother, father, mother, and paternal grandfather; Hypertension in his father, mother, and paternal grandfather; Stroke in his brother, maternal grandmother, and paternal grandmother.He reports that he has never smoked. He has never used smokeless tobacco. He reports that he does not drink alcohol and does not use drugs.  Current Outpatient Medications on File Prior to Visit  Medication Sig Dispense Refill   acetaminophen (TYLENOL) 500 MG tablet Take 1,000 mg by mouth every 6 (six) hours as needed for mild pain.     amLODipine (NORVASC) 10 MG tablet Take 1 tablet (10 mg total) by mouth daily. 90 tablet 3   aspirin EC 81 MG tablet Take 81 mg by mouth daily.     atorvastatin (LIPITOR) 80 MG tablet Take 1 tablet (80 mg total) by mouth daily. 90 tablet 2   empagliflozin (JARDIANCE) 25 MG TABS tablet Take 1 tablet (25 mg total) by mouth daily before breakfast. 90 tablet 3   GINSENG PO Take 2 tablets by mouth daily.     glucose blood (ONE TOUCH ULTRA  TEST) test strip Use to check blood sugars up to four times a day 400 each 1   metFORMIN (GLUCOPHAGE-XR) 750 MG 24 hr tablet Take 2 tablets (1,500 mg total) by mouth daily with breakfast. 180 tablet 3   metoprolol tartrate (LOPRESSOR) 100 MG tablet Take 1 tablet (100 mg total) by mouth 2 (two) times daily. 180 tablet 3   Multiple Vitamin (ONE-A-DAY MENS PO) Take 1 tablet by mouth daily.     pantoprazole (PROTONIX) 40 MG tablet TAKE 1 TABLET BY MOUTH DAILY FOR STOMACH 90 tablet 3   valsartan (DIOVAN) 160 MG tablet Take 1 tablet (160 mg total) by mouth daily. 90 tablet 1   No current facility-administered medications on file prior to visit.    ROS Review of Systems  Objective:  BP 135/74   Pulse 60   Temp (!) 97.1 F (36.2 C)   Ht 5\' 8"  (1.727 m)   Wt 171 lb 6.4 oz (77.7 kg)   SpO2 94%   BMI 26.06 kg/m   BP Readings from Last 3 Encounters:  10/29/22 135/74  10/13/22 (!) 211/94  09/09/22 (!) 160/70    Wt Readings from Last 3 Encounters:  10/29/22 171 lb 6.4 oz (77.7 kg)  10/13/22 173 lb (78.5 kg)  09/09/22 173 lb 9.6 oz (78.7 kg)     Physical Exam Vitals reviewed.  Constitutional:      Appearance: He is well-developed.  HENT:     Head: Normocephalic and atraumatic.     Right Ear: External ear normal.  Left Ear: External ear normal.     Mouth/Throat:     Pharynx: No oropharyngeal exudate or posterior oropharyngeal erythema.  Eyes:     Pupils: Pupils are equal, round, and reactive to light.  Cardiovascular:     Rate and Rhythm: Normal rate and regular rhythm.     Heart sounds: No murmur heard. Pulmonary:     Effort: No respiratory distress.     Breath sounds: Normal breath sounds.  Musculoskeletal:     Cervical back: Normal range of motion and neck supple.  Neurological:     Mental Status: He is alert and oriented to person, place, and time.       Assessment & Plan:   Jesus Curtis was seen today for follow-up.  Diagnoses and all orders for this visit:  Type  2 diabetes mellitus without complication, without long-term current use of insulin (HCC) -     Microalbumin / creatinine urine ratio  Essential hypertension   Allergies as of 10/29/2022       Reactions   Ativan [lorazepam] Other (See Comments)   "goes wild"   Carvedilol Other (See Comments)   Dizziness, and Headache        Medication List        Accurate as of October 29, 2022  1:59 PM. If you have any questions, ask your nurse or doctor.          acetaminophen 500 MG tablet Commonly known as: TYLENOL Take 1,000 mg by mouth every 6 (six) hours as needed for mild pain.   amLODipine 10 MG tablet Commonly known as: NORVASC Take 1 tablet (10 mg total) by mouth daily.   aspirin EC 81 MG tablet Take 81 mg by mouth daily.   atorvastatin 80 MG tablet Commonly known as: LIPITOR Take 1 tablet (80 mg total) by mouth daily.   empagliflozin 25 MG Tabs tablet Commonly known as: Jardiance Take 1 tablet (25 mg total) by mouth daily before breakfast.   GINSENG PO Take 2 tablets by mouth daily.   glucose blood test strip Commonly known as: ONE TOUCH ULTRA TEST Use to check blood sugars up to four times a day   metFORMIN 750 MG 24 hr tablet Commonly known as: GLUCOPHAGE-XR Take 2 tablets (1,500 mg total) by mouth daily with breakfast.   metoprolol tartrate 100 MG tablet Commonly known as: LOPRESSOR Take 1 tablet (100 mg total) by mouth 2 (two) times daily.   ONE-A-DAY MENS PO Take 1 tablet by mouth daily.   pantoprazole 40 MG tablet Commonly known as: PROTONIX TAKE 1 TABLET BY MOUTH DAILY FOR STOMACH   valsartan 160 MG tablet Commonly known as: DIOVAN Take 1 tablet (160 mg total) by mouth daily.           Follow-up: Return in about 3 months (around 01/20/2023) for diabetes, hypertension.  Mechele Claude, M.D.

## 2022-10-30 LAB — MICROALBUMIN / CREATININE URINE RATIO
Creatinine, Urine: 109.9 mg/dL
Microalb/Creat Ratio: 12 mg/g creat (ref 0–29)
Microalbumin, Urine: 13.7 ug/mL

## 2022-12-03 DIAGNOSIS — Z7984 Long term (current) use of oral hypoglycemic drugs: Secondary | ICD-10-CM | POA: Diagnosis not present

## 2022-12-03 DIAGNOSIS — E119 Type 2 diabetes mellitus without complications: Secondary | ICD-10-CM | POA: Diagnosis not present

## 2022-12-03 DIAGNOSIS — Z961 Presence of intraocular lens: Secondary | ICD-10-CM | POA: Diagnosis not present

## 2022-12-03 LAB — HM DIABETES EYE EXAM

## 2022-12-25 ENCOUNTER — Other Ambulatory Visit: Payer: Self-pay | Admitting: Family Medicine

## 2022-12-25 DIAGNOSIS — I10 Essential (primary) hypertension: Secondary | ICD-10-CM

## 2023-01-29 ENCOUNTER — Ambulatory Visit: Payer: Medicare Other | Admitting: Family Medicine

## 2023-01-29 ENCOUNTER — Encounter: Payer: Self-pay | Admitting: Family Medicine

## 2023-01-29 VITALS — BP 163/80 | HR 60 | Temp 97.0°F | Ht 68.0 in | Wt 171.4 lb

## 2023-01-29 DIAGNOSIS — Z7984 Long term (current) use of oral hypoglycemic drugs: Secondary | ICD-10-CM | POA: Diagnosis not present

## 2023-01-29 DIAGNOSIS — E1169 Type 2 diabetes mellitus with other specified complication: Secondary | ICD-10-CM

## 2023-01-29 DIAGNOSIS — E785 Hyperlipidemia, unspecified: Secondary | ICD-10-CM

## 2023-01-29 DIAGNOSIS — E119 Type 2 diabetes mellitus without complications: Secondary | ICD-10-CM | POA: Diagnosis not present

## 2023-01-29 DIAGNOSIS — I10 Essential (primary) hypertension: Secondary | ICD-10-CM

## 2023-01-29 LAB — BAYER DCA HB A1C WAIVED: HB A1C (BAYER DCA - WAIVED): 7 % — ABNORMAL HIGH (ref 4.8–5.6)

## 2023-01-29 MED ORDER — VALSARTAN 320 MG PO TABS: 320.0000 mg | ORAL_TABLET | Freq: Every day | ORAL | 1 refills | Status: DC

## 2023-01-29 MED ORDER — AMLODIPINE BESYLATE 10 MG PO TABS
10.0000 mg | ORAL_TABLET | Freq: Every day | ORAL | 3 refills | Status: DC
Start: 2023-01-29 — End: 2023-11-12

## 2023-01-29 NOTE — Progress Notes (Signed)
Subjective:  Patient ID: Jesus Curtis,  male    DOB: 01/13/1941  Age: 82 y.o.    CC: Medical Management of Chronic Issues   HPI Jesus Curtis presents for  follow-up of hypertension. Patient has no history of headache chest pain or shortness of breath or recent cough. Patient also denies symptoms of TIA such as numbness weakness lateralizing. Patient denies side effects from medication. States taking it regularly.  Patient also  in for follow-up of elevated cholesterol. Doing well without complaints on current medication. Denies side effects  including myalgia and arthralgia and nausea. Also in today for liver function testing. Currently no chest pain, shortness of breath or other cardiovascular related symptoms noted.  Follow-up of diabetes. Patient does check blood sugar at home. Readings run around 130 fasting Patient denies symptoms such as excessive hunger or urinary frequency, excessive hunger, nausea No significant hypoglycemic spells noted. Medications reviewed. Pt reports taking them regularly. Pt. denies complication/adverse reaction today.    History Jesus Curtis has a past medical history of Abnormal stress test, Aortic atherosclerosis (HCC), Chest pain, Elevated hemoglobin A1c, Elevated troponin, Hypertension, Nausea & vomiting, Snake bite, SVT (supraventricular tachycardia) (HCC), and Syncope.   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   His family history includes Heart attack in his brother, brother, father, and mother; Heart disease in his brother, father, mother, and paternal grandfather; Hypertension in his father, mother, and paternal grandfather; Stroke in his brother, maternal grandmother, and paternal grandmother.He reports that he has never smoked. He has never used smokeless tobacco. He reports that he does not  drink alcohol and does not use drugs.  Current Outpatient Medications on File Prior to Visit  Medication Sig Dispense Refill   acetaminophen (TYLENOL) 500 MG tablet Take 1,000 mg by mouth every 6 (six) hours as needed for mild pain.     aspirin EC 81 MG tablet Take 81 mg by mouth daily.     atorvastatin (LIPITOR) 80 MG tablet Take 1 tablet (80 mg total) by mouth daily. 90 tablet 2   empagliflozin (JARDIANCE) 25 MG TABS tablet Take 1 tablet (25 mg total) by mouth daily before breakfast. 90 tablet 3   GINSENG PO Take 2 tablets by mouth daily.     glucose blood (ONE TOUCH ULTRA TEST) test strip Use to check blood sugars up to four times a day 400 each 1   metFORMIN (GLUCOPHAGE-XR) 750 MG 24 hr tablet Take 2 tablets (1,500 mg total) by mouth daily with breakfast. 180 tablet 3   metoprolol tartrate (LOPRESSOR) 100 MG tablet Take 1 tablet (100 mg total) by mouth 2 (two) times daily. 180 tablet 3   Multiple Vitamin (ONE-A-DAY MENS PO) Take 1 tablet by mouth daily.     pantoprazole (PROTONIX) 40 MG tablet TAKE 1 TABLET BY MOUTH DAILY FOR STOMACH 90 tablet 3   No current facility-administered medications on file prior to visit.    ROS Review of Systems  Constitutional:  Negative for fever.  Respiratory:  Negative for shortness of breath.   Cardiovascular:  Negative for chest pain.  Musculoskeletal:  Negative for arthralgias.  Skin:  Negative for rash.    Objective:  BP (!) 163/80   Pulse 60   Temp (!) 97 F (36.1 C)   Ht 5\' 8"  (1.727 m)   Wt 171 lb 6.6 oz (77.8 kg)  SpO2 96%   BMI 26.06 kg/m   BP Readings from Last 3 Encounters:  01/29/23 (!) 163/80  10/29/22 135/74  10/13/22 (!) 211/94    Wt Readings from Last 3 Encounters:  01/29/23 171 lb 6.6 oz (77.8 kg)  10/29/22 171 lb 6.4 oz (77.7 kg)  10/13/22 173 lb (78.5 kg)     Physical Exam Vitals reviewed.  Constitutional:      Appearance: He is well-developed.  HENT:     Head: Normocephalic and atraumatic.     Right Ear:  External ear normal.     Left Ear: External ear normal.     Mouth/Throat:     Pharynx: No oropharyngeal exudate or posterior oropharyngeal erythema.  Eyes:     Pupils: Pupils are equal, round, and reactive to light.  Cardiovascular:     Rate and Rhythm: Normal rate and regular rhythm.     Heart sounds: No murmur heard. Pulmonary:     Effort: No respiratory distress.     Breath sounds: Normal breath sounds.  Musculoskeletal:     Cervical back: Normal range of motion and neck supple.  Neurological:     Mental Status: He is alert and oriented to person, place, and time.     Diabetic Foot Exam - Simple   No data filed     Lab Results  Component Value Date   HGBA1C 7.9 (H) 10/13/2022   HGBA1C 8.5 (H) 07/10/2022   HGBA1C 8.7 (H) 04/09/2022    Assessment & Plan:   Jesus Curtis was seen today for medical management of chronic issues.  Diagnoses and all orders for this visit:  Type 2 diabetes mellitus without complication, without long-term current use of insulin (HCC) -     Bayer DCA Hb A1c Waived  Essential hypertension -     CBC with Differential/Platelet -     CMP14+EGFR -     amLODipine (NORVASC) 10 MG tablet; Take 1 tablet (10 mg total) by mouth daily.  Hyperlipidemia associated with type 2 diabetes mellitus (HCC) -     Lipid panel  Other orders -     valsartan (DIOVAN) 320 MG tablet; Take 1 tablet (320 mg total) by mouth daily.   I have changed Jesus Curtis's amLODipine and valsartan. I am also having him maintain his Multiple Vitamin (ONE-A-DAY MENS PO), acetaminophen, GINSENG PO, aspirin EC, glucose blood, pantoprazole, metFORMIN, atorvastatin, metoprolol tartrate, and empagliflozin.  Meds ordered this encounter  Medications   amLODipine (NORVASC) 10 MG tablet    Sig: Take 1 tablet (10 mg total) by mouth daily.    Dispense:  180 tablet    Refill:  3    Please send a replace/new response with 90-Day Supply if appropriate to maximize member benefit. Requesting 1  year supply.   valsartan (DIOVAN) 320 MG tablet    Sig: Take 1 tablet (320 mg total) by mouth daily.    Dispense:  90 tablet    Refill:  1     Follow-up: Return in about 3 months (around 05/01/2023).  Mechele Claude, M.D.

## 2023-01-30 LAB — LIPID PANEL
Chol/HDL Ratio: 2.6 {ratio} (ref 0.0–5.0)
Cholesterol, Total: 95 mg/dL — ABNORMAL LOW (ref 100–199)
HDL: 37 mg/dL — ABNORMAL LOW (ref >39)
LDL Chol Calc (NIH): 41 mg/dL (ref 0–99)
Triglycerides: 81 mg/dL (ref 0–149)
VLDL Cholesterol Cal: 17 mg/dL (ref 5–40)

## 2023-01-30 LAB — CBC WITH DIFFERENTIAL/PLATELET
Basophils Absolute: 0 10*3/uL (ref 0.0–0.2)
Basos: 1 %
EOS (ABSOLUTE): 0.2 10*3/uL (ref 0.0–0.4)
Eos: 4 %
Hematocrit: 44.4 % (ref 37.5–51.0)
Hemoglobin: 14.6 g/dL (ref 13.0–17.7)
Immature Grans (Abs): 0 10*3/uL (ref 0.0–0.1)
Immature Granulocytes: 0 %
Lymphocytes Absolute: 2.2 10*3/uL (ref 0.7–3.1)
Lymphs: 46 %
MCH: 30.9 pg (ref 26.6–33.0)
MCHC: 32.9 g/dL (ref 31.5–35.7)
MCV: 94 fL (ref 79–97)
Monocytes Absolute: 0.4 10*3/uL (ref 0.1–0.9)
Monocytes: 8 %
Neutrophils Absolute: 2 10*3/uL (ref 1.4–7.0)
Neutrophils: 41 %
Platelets: 227 10*3/uL (ref 150–450)
RBC: 4.72 x10E6/uL (ref 4.14–5.80)
RDW: 13.1 % (ref 11.6–15.4)
WBC: 4.8 10*3/uL (ref 3.4–10.8)

## 2023-01-30 LAB — CMP14+EGFR
ALT: 20 [IU]/L (ref 0–44)
AST: 22 [IU]/L (ref 0–40)
Albumin: 4.6 g/dL (ref 3.8–4.8)
Alkaline Phosphatase: 80 [IU]/L (ref 44–121)
BUN/Creatinine Ratio: 11 (ref 10–24)
BUN: 11 mg/dL (ref 8–27)
Bilirubin Total: 0.5 mg/dL (ref 0.0–1.2)
CO2: 20 mmol/L (ref 20–29)
Calcium: 9.6 mg/dL (ref 8.6–10.2)
Chloride: 106 mmol/L (ref 96–106)
Creatinine, Ser: 0.96 mg/dL (ref 0.76–1.27)
Globulin, Total: 2.3 g/dL (ref 1.5–4.5)
Glucose: 145 mg/dL — ABNORMAL HIGH (ref 70–99)
Potassium: 4.9 mmol/L (ref 3.5–5.2)
Sodium: 142 mmol/L (ref 134–144)
Total Protein: 6.9 g/dL (ref 6.0–8.5)
eGFR: 80 mL/min/{1.73_m2} (ref >59)

## 2023-02-01 NOTE — Progress Notes (Signed)
Hello Seng,  Your lab result is normal and/or stable.Some minor variations that are not significant are commonly marked abnormal, but do not represent any medical problem for you.  Best regards, Mechele Claude, M.D.

## 2023-02-04 ENCOUNTER — Telehealth: Payer: Self-pay | Admitting: Family Medicine

## 2023-02-04 NOTE — Telephone Encounter (Signed)
Attempted to contact - NA 

## 2023-02-04 NOTE — Telephone Encounter (Signed)
Returned call and answered questions per dpr.

## 2023-02-04 NOTE — Telephone Encounter (Signed)
Pt wants to know if he should be taking 160MG  or valsartan (DIOVAN) 320 MG tablet. Please call back

## 2023-02-20 ENCOUNTER — Ambulatory Visit: Payer: Medicare Other | Attending: Cardiology | Admitting: Cardiology

## 2023-02-20 ENCOUNTER — Encounter: Payer: Self-pay | Admitting: Cardiology

## 2023-02-20 VITALS — BP 145/85 | HR 57 | Ht 69.0 in | Wt 172.0 lb

## 2023-02-20 DIAGNOSIS — I2510 Atherosclerotic heart disease of native coronary artery without angina pectoris: Secondary | ICD-10-CM

## 2023-02-20 DIAGNOSIS — I10 Essential (primary) hypertension: Secondary | ICD-10-CM | POA: Diagnosis not present

## 2023-02-20 DIAGNOSIS — E782 Mixed hyperlipidemia: Secondary | ICD-10-CM | POA: Diagnosis not present

## 2023-02-20 DIAGNOSIS — Z79899 Other long term (current) drug therapy: Secondary | ICD-10-CM | POA: Diagnosis not present

## 2023-02-20 NOTE — Patient Instructions (Signed)
Medication Instructions:   Continue all current medications.   Labwork:  none  Testing/Procedures:  BMET - order given  Office will contact with results via phone, letter or mychart.    Follow-Up:  6 months   Any Other Special Instructions Will Be Listed Below (If Applicable).   If you need a refill on your cardiac medications before your next appointment, please call your pharmacy.

## 2023-02-20 NOTE — Progress Notes (Signed)
Clinical Summary Jesus Curtis is a 82 y.o.male seen for the following medical problems.    1. CAD - 11/2016 cath with severe multivessel disease, referred for CABG - 11/24/16 CABG x4 (left internal mammary artery to     left anterior descending, saphenous vein graft to diagonal,     saphenous vein graft to circumflex marginal, saphenous vein graft     to posterior descending Jesus Curtis of the distal circumflex) - 11/2016 echo LVEF 55-60%, grade II diastolic dysfunction       - denies any chest pains, no SOB/DOE - compliant with meds     2. HTN - on chlorthalidone had orthoststic dizzziness, stopped taking  -coreg caused headache, went back to lopressor - valsartan increased to 320mg  by pcp  - home bp's 128/66 - compliant with meds       3. Hyperlipidemia  - 06/2022 TC 102 TG 82 HDL 39 LDL 46 01/2023 TC 95 TG 81 HDL 37 LDL 41 - he is on atorva 80mg  daily   4. DM2 - followed by pcp   Past Medical History:  Diagnosis Date   Abnormal stress test    Aortic atherosclerosis (HCC)    Chest pain    Elevated hemoglobin A1c    Elevated troponin    Hypertension    Nausea & vomiting    Snake bite    AS A CHILD HAD PMH   SVT (supraventricular tachycardia) (HCC)    Syncope      Allergies  Allergen Reactions   Ativan [Lorazepam] Other (See Comments)    "goes wild"   Carvedilol Other (See Comments)    Dizziness, and Headache     Current Outpatient Medications  Medication Sig Dispense Refill   acetaminophen (TYLENOL) 500 MG tablet Take 1,000 mg by mouth every 6 (six) hours as needed for mild pain.     amLODipine (NORVASC) 10 MG tablet Take 1 tablet (10 mg total) by mouth daily. 180 tablet 3   aspirin EC 81 MG tablet Take 81 mg by mouth daily.     atorvastatin (LIPITOR) 80 MG tablet Take 1 tablet (80 mg total) by mouth daily. 90 tablet 2   empagliflozin (JARDIANCE) 25 MG TABS tablet Take 1 tablet (25 mg total) by mouth daily before breakfast. 90 tablet 3   GINSENG PO  Take 2 tablets by mouth daily.     glucose blood (ONE TOUCH ULTRA TEST) test strip Use to check blood sugars up to four times a day 400 each 1   metFORMIN (GLUCOPHAGE-XR) 750 MG 24 hr tablet Take 2 tablets (1,500 mg total) by mouth daily with breakfast. 180 tablet 3   metoprolol tartrate (LOPRESSOR) 100 MG tablet Take 1 tablet (100 mg total) by mouth 2 (two) times daily. 180 tablet 3   Multiple Vitamin (ONE-A-DAY MENS PO) Take 1 tablet by mouth daily.     pantoprazole (PROTONIX) 40 MG tablet TAKE 1 TABLET BY MOUTH DAILY FOR STOMACH 90 tablet 3   valsartan (DIOVAN) 320 MG tablet Take 1 tablet (320 mg total) by mouth daily. 90 tablet 1   No current facility-administered medications for this visit.     Past Surgical History:  Procedure Laterality Date   CORONARY ARTERY BYPASS GRAFT N/A 11/24/2016   Procedure: CORONARY ARTERY BYPASS GRAFTING (CABG), ON PUMP, TIMES FOUR, USING LEFT INTERNAL MAMMARY ARTERY AND ENDOSCOPICALLY HARVESTED RIGHT GREATER SAPHENOUS VEIN;  Surgeon: Kerin Perna, MD;  Location: [facility] OR;  Service: Open Heart Surgery;  Laterality: N/A;  LIMA to LAD SVG to PDA SVG to OM1 SVG to Diag1   LEFT HEART CATH AND CORONARY ANGIOGRAPHY N/A 11/21/2016   Procedure: LEFT HEART CATH AND CORONARY ANGIOGRAPHY;  Surgeon: Lennette Bihari, MD;  Location: [facility] INVASIVE CV LAB;  Service: Cardiovascular;  Laterality: N/A;   TEE WITHOUT CARDIOVERSION N/A 11/24/2016   Procedure: TRANSESOPHAGEAL ECHOCARDIOGRAM (TEE);  Surgeon: Donata Clay, Theron Arista, MD;  Location: [facility] OR;  Service: Open Heart Surgery;  Laterality: N/A;     Allergies  Allergen Reactions   Ativan [Lorazepam] Other (See Comments)    "goes wild"   Carvedilol Other (See Comments)    Dizziness, and Headache      Family History  Problem Relation Age of Onset   Heart attack Mother    Heart disease Mother    Hypertension Mother    Heart attack Father    Heart disease Father    Hypertension Father    Heart attack Brother    Heart  disease Brother    Stroke Brother    Stroke Maternal Grandmother    Stroke Paternal Grandmother    Heart disease Paternal Grandfather    Hypertension Paternal Grandfather    Heart attack Brother      Social History Jesus Curtis reports that he has never smoked. He has never used smokeless tobacco. Jesus Curtis reports no history of alcohol use.   Review of Systems CONSTITUTIONAL: No weight loss, fever, chills, weakness or fatigue.  HEENT: Eyes: No visual loss, blurred vision, double vision or yellow sclerae.No hearing loss, sneezing, congestion, runny nose or sore throat.  SKIN: No rash or itching.  CARDIOVASCULAR: per hpi RESPIRATORY: No shortness of breath, cough or sputum.  GASTROINTESTINAL: No anorexia, nausea, vomiting or diarrhea. No abdominal pain or blood.  GENITOURINARY: No burning on urination, no polyuria NEUROLOGICAL: No headache, dizziness, syncope, paralysis, ataxia, numbness or tingling in the extremities. No change in bowel or bladder control.  MUSCULOSKELETAL: No muscle, back pain, joint pain or stiffness.  LYMPHATICS: No enlarged nodes. No history of splenectomy.  PSYCHIATRIC: No history of depression or anxiety.  ENDOCRINOLOGIC: No reports of sweating, cold or heat intolerance. No polyuria or polydipsia.  Marland Kitchen   Physical Examination Vitals:   02/20/23 1108 02/20/23 1136  BP: (!) 158/84 (!) 145/85  Pulse:    SpO2:     Filed Weights   02/20/23 1104  Weight: 172 lb (78 kg)    Gen: resting comfortably, no acute distress HEENT: no scleral icterus, pupils equal round and reactive, no palptable cervical adenopathy,  CV: RRR, no m/rg, no jvd Resp: Clear to auscultation bilaterally GI: abdomen is soft, non-tender, non-distended, normal bowel sounds, no hepatosplenomegaly MSK: extremities are warm, no edema.  Skin: warm, no rash Neuro:  no focal deficits Psych: appropriate affect   Diagnostic Studies 11/2016 cath   LM lesion, 30 %stenosed. Ost LAD to Prox  LAD lesion, 30 %stenosed. Mid LAD lesion, 90 %stenosed. 1st Mrg lesion, 50 %stenosed. Ost Cx to Prox Cx lesion, 70 %stenosed. Prox Cx to Dist Cx lesion, 70 %stenosed. Ost RCA to Prox RCA lesion, 80 %stenosed. Mid RCA lesion, 100 %stenosed. LV end diastolic pressure is normal.     11/2016 echo     - Left ventricle: The cavity size was normal. Systolic function was   normal. The estimated ejection fraction was in the range of 55%   to 60%. Wall motion was normal; there were no regional wall   motion abnormalities. Features are consistent  with a pseudonormal   left ventricular filling pattern, with concomitant abnormal   relaxation and increased filling pressure (grade 2 diastolic   dysfunction). Doppler parameters are consistent with elevated   mean left atrial filling pressure. - Left atrium: The atrium was mildly dilated. - Pericardium, extracardiac: A trivial pericardial effusion was   identified.     11/2016 carotid US Findings suggest 1-39% internal carotid artery stenosis bilaterally. Vertebral arteries are patent with antegrade flow.    Assessment and Plan   1. CAD - no recent symptoms, continue current meds   2. HTN - elevated in clinic but home numbers at goal, continue current meds - with recent increase in his ARB we will obtain a bmet. Has had borderline high K in the past.      3.Hyperlipidemia - he is at goal, continue current meds     Antoine Poche, M.D.

## 2023-02-25 ENCOUNTER — Other Ambulatory Visit: Payer: Medicare Other

## 2023-02-25 DIAGNOSIS — I2510 Atherosclerotic heart disease of native coronary artery without angina pectoris: Secondary | ICD-10-CM | POA: Diagnosis not present

## 2023-02-25 DIAGNOSIS — Z79899 Other long term (current) drug therapy: Secondary | ICD-10-CM | POA: Diagnosis not present

## 2023-02-25 DIAGNOSIS — I10 Essential (primary) hypertension: Secondary | ICD-10-CM | POA: Diagnosis not present

## 2023-02-26 LAB — BASIC METABOLIC PANEL
BUN/Creatinine Ratio: 11 (ref 10–24)
BUN: 10 mg/dL (ref 8–27)
CO2: 19 mmol/L — ABNORMAL LOW (ref 20–29)
Calcium: 9.9 mg/dL (ref 8.6–10.2)
Chloride: 106 mmol/L (ref 96–106)
Creatinine, Ser: 0.89 mg/dL (ref 0.76–1.27)
Glucose: 136 mg/dL — ABNORMAL HIGH (ref 70–99)
Potassium: 5 mmol/L (ref 3.5–5.2)
Sodium: 140 mmol/L (ref 134–144)
eGFR: 87 mL/min/{1.73_m2} (ref >59)

## 2023-03-25 ENCOUNTER — Telehealth: Payer: Self-pay | Admitting: Cardiology

## 2023-03-25 NOTE — Telephone Encounter (Signed)
Wife was returning call. Please advise ?

## 2023-03-25 NOTE — Telephone Encounter (Signed)
-----   Message from Dina Rich sent at 03/17/2023  4:36 PM EST ----- Labs look fine  J BrancH MD

## 2023-03-25 NOTE — Telephone Encounter (Signed)
Patient's wife Erskine Squibb informed. Copy sent to PCP

## 2023-05-05 ENCOUNTER — Ambulatory Visit: Payer: Medicare Other | Admitting: Family Medicine

## 2023-05-15 ENCOUNTER — Ambulatory Visit (INDEPENDENT_AMBULATORY_CARE_PROVIDER_SITE_OTHER): Payer: Medicare Other | Admitting: Family Medicine

## 2023-05-15 ENCOUNTER — Encounter: Payer: Self-pay | Admitting: Family Medicine

## 2023-05-15 VITALS — BP 159/77 | HR 65 | Temp 97.4°F | Ht 69.0 in | Wt 173.0 lb

## 2023-05-15 DIAGNOSIS — I152 Hypertension secondary to endocrine disorders: Secondary | ICD-10-CM | POA: Diagnosis not present

## 2023-05-15 DIAGNOSIS — I10 Essential (primary) hypertension: Secondary | ICD-10-CM

## 2023-05-15 DIAGNOSIS — E119 Type 2 diabetes mellitus without complications: Secondary | ICD-10-CM

## 2023-05-15 DIAGNOSIS — E785 Hyperlipidemia, unspecified: Secondary | ICD-10-CM | POA: Diagnosis not present

## 2023-05-15 DIAGNOSIS — Z7984 Long term (current) use of oral hypoglycemic drugs: Secondary | ICD-10-CM

## 2023-05-15 DIAGNOSIS — E1159 Type 2 diabetes mellitus with other circulatory complications: Secondary | ICD-10-CM

## 2023-05-15 DIAGNOSIS — I2510 Atherosclerotic heart disease of native coronary artery without angina pectoris: Secondary | ICD-10-CM | POA: Diagnosis not present

## 2023-05-15 DIAGNOSIS — E1169 Type 2 diabetes mellitus with other specified complication: Secondary | ICD-10-CM | POA: Diagnosis not present

## 2023-05-15 LAB — LIPID PANEL

## 2023-05-15 LAB — BAYER DCA HB A1C WAIVED: HB A1C (BAYER DCA - WAIVED): 7.1 % — ABNORMAL HIGH (ref 4.8–5.6)

## 2023-05-15 MED ORDER — METFORMIN HCL ER 750 MG PO TB24
1500.0000 mg | ORAL_TABLET | Freq: Every day | ORAL | 3 refills | Status: DC
Start: 2023-05-15 — End: 2024-02-23

## 2023-05-15 MED ORDER — VALSARTAN 320 MG PO TABS: 320.0000 mg | ORAL_TABLET | Freq: Every day | ORAL | 3 refills | Status: DC

## 2023-05-15 MED ORDER — PANTOPRAZOLE SODIUM 40 MG PO TBEC: DELAYED_RELEASE_TABLET | ORAL | 3 refills | Status: DC

## 2023-05-15 NOTE — Progress Notes (Signed)
Subjective:  Patient ID: Jesus Curtis,  male    DOB: [birth year]  Age: 83 y.o.    CC: Medical Management of Chronic Issues   HPI Emanuell Morina presents for  follow-up of hypertension. Patient has no history of headache chest pain or shortness of breath or recent cough. Patient also denies symptoms of TIA such as numbness weakness lateralizing. Patient denies side effects from medication. States taking it regularly. Home BP readings run 130/70 or close to that.   Patient also  in for follow-up of elevated cholesterol. Doing well without complaints on current medication. Denies side effects  including myalgia and arthralgia and nausea. Also in today for liver function testing. Currently no chest pain, shortness of breath or other cardiovascular related symptoms noted.  Follow-up of diabetes. Patient does check blood sugar at home. Readings run between 120-130 fasting and  not checking postprandial.  Patient denies symptoms such as excessive hunger or urinary frequency, excessive hunger, nausea No significant hypoglycemic spells noted. Medications reviewed. Pt reports taking them regularly. Pt. denies complication/adverse reaction today.    History Kenneth has a past medical history of Abnormal stress test, Aortic atherosclerosis (HCC), Chest pain, Elevated hemoglobin A1c, Elevated troponin, Hypertension, Nausea & vomiting, Snake bite, SVT (supraventricular tachycardia) (HCC), and Syncope.   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   His family history includes Heart attack in his brother, brother, father, and mother; Heart disease in his brother, father, mother, and paternal grandfather; Hypertension in his father, mother, and paternal grandfather; Stroke in his brother, maternal grandmother, and paternal grandmother.He reports that  he has never smoked. He has never used smokeless tobacco. He reports that he does not drink alcohol and does not use drugs.  Current Outpatient Medications on File Prior to Visit  Medication Sig Dispense Refill   acetaminophen (TYLENOL) 500 MG tablet Take 1,000 mg by mouth every 6 (six) hours as needed for mild pain.     amLODipine (NORVASC) 10 MG tablet Take 1 tablet (10 mg total) by mouth daily. 180 tablet 3   aspirin EC 81 MG tablet Take 81 mg by mouth daily.     atorvastatin (LIPITOR) 80 MG tablet Take 1 tablet (80 mg total) by mouth daily. 90 tablet 2   empagliflozin (JARDIANCE) 25 MG TABS tablet Take 1 tablet (25 mg total) by mouth daily before breakfast. 90 tablet 3   GINSENG PO Take 2 tablets by mouth daily.     glucose blood (ONE TOUCH ULTRA TEST) test strip Use to check blood sugars up to four times a day 400 each 1   metoprolol tartrate (LOPRESSOR) 100 MG tablet Take 1 tablet (100 mg total) by mouth 2 (two) times daily. 180 tablet 3   Multiple Vitamin (ONE-A-DAY MENS PO) Take 1 tablet by mouth daily.     No current facility-administered medications on file prior to visit.    ROS Review of Systems  Constitutional:  Negative for fever.  Respiratory:  Negative for shortness of breath.   Cardiovascular:  Negative for chest pain.  Musculoskeletal:  Negative for arthralgias.  Skin:  Negative for rash.    Objective:  BP (!) 159/77   Pulse 65   Temp (!) 97.4 F (36.3 C)   Ht 5\' 9"  (1.753 m)   Wt 173 lb (78.5 kg)   SpO2 97%   BMI 25.55 kg/m  BP Readings from Last 3 Encounters:  05/15/23 (!) 159/77  02/20/23 (!) 145/85  01/29/23 (!) 163/80    Wt Readings from Last 3 Encounters:  05/15/23 173 lb (78.5 kg)  02/20/23 172 lb (78 kg)  01/29/23 171 lb 6.6 oz (77.8 kg)     Physical Exam Vitals reviewed.  Constitutional:      Appearance: He is well-developed.  HENT:     Head: Normocephalic and atraumatic.     Right Ear: External ear normal.     Left Ear: External  ear normal.     Mouth/Throat:     Pharynx: No oropharyngeal exudate or posterior oropharyngeal erythema.  Eyes:     Pupils: Pupils are equal, round, and reactive to light.  Cardiovascular:     Rate and Rhythm: Normal rate and regular rhythm.     Heart sounds: No murmur heard. Pulmonary:     Effort: No respiratory distress.     Breath sounds: Normal breath sounds.  Musculoskeletal:     Cervical back: Normal range of motion and neck supple.  Neurological:     Mental Status: He is alert and oriented to person, place, and time.     Diabetic Foot Exam - Simple   No data filed     Lab Results  Component Value Date   HGBA1C 7.0 (H) 01/29/2023   HGBA1C 7.9 (H) 10/13/2022   HGBA1C 8.5 (H) 07/10/2022    Assessment & Plan:   Jejuan was seen today for medical management of chronic issues.  Diagnoses and all orders for this visit:  Type 2 diabetes mellitus without complication, without long-term current use of insulin (HCC) -     Bayer DCA Hb A1c Waived -     metFORMIN (GLUCOPHAGE-XR) 750 MG 24 hr tablet; Take 2 tablets (1,500 mg total) by mouth daily with breakfast.  Essential hypertension -     Basic Metabolic Panel -     CMP14+EGFR -     CBC with Differential/Platelet  Hyperlipidemia associated with type 2 diabetes mellitus (HCC) -     Lipid panel  High blood pressure associated with diabetes (HCC) -     Basic Metabolic Panel -     CBC with Differential/Platelet -     Bayer DCA Hb A1c Waived  ASCVD (arteriosclerotic cardiovascular disease) -     Basic Metabolic Panel -     Lipid panel -     CMP14+EGFR -     CBC with Differential/Platelet  Other orders -     pantoprazole (PROTONIX) 40 MG tablet; TAKE 1 TABLET BY MOUTH DAILY FOR STOMACH -     valsartan (DIOVAN) 320 MG tablet; Take 1 tablet (320 mg total) by mouth daily.   I am having Ransome A. Strey maintain his Multiple Vitamin (ONE-A-DAY MENS PO), acetaminophen, GINSENG PO, aspirin EC, glucose blood, atorvastatin,  metoprolol tartrate, empagliflozin, amLODipine, metFORMIN, pantoprazole, and valsartan.  Meds ordered this encounter  Medications   metFORMIN (GLUCOPHAGE-XR) 750 MG 24 hr tablet    Sig: Take 2 tablets (1,500 mg total) by mouth daily with breakfast.    Dispense:  180 tablet    Refill:  3    Replaces Metformin ER 500. Please cancel that scrip , sent earlier today, and all potential refills   pantoprazole (PROTONIX) 40 MG tablet    Sig: TAKE 1 TABLET BY MOUTH DAILY FOR STOMACH    Dispense:  90 tablet    Refill:  3    Please send a replace/new response  with 90-Day Supply if appropriate to maximize member benefit.   valsartan (DIOVAN) 320 MG tablet    Sig: Take 1 tablet (320 mg total) by mouth daily.    Dispense:  90 tablet    Refill:  3     Follow-up: Return in about 3 months (around 08/13/2023).  Mechele Claude, M.D.

## 2023-05-15 NOTE — Patient Instructions (Signed)
Moisturize feet with Cetaphil, Cera vie or Aquaphor once or twice every day.

## 2023-05-16 LAB — CMP14+EGFR
ALT: 17 [IU]/L (ref 0–44)
AST: 15 [IU]/L (ref 0–40)
Albumin: 4.5 g/dL (ref 3.7–4.7)
Alkaline Phosphatase: 76 [IU]/L (ref 44–121)
BUN/Creatinine Ratio: 10 (ref 10–24)
BUN: 10 mg/dL (ref 8–27)
Bilirubin Total: 0.5 mg/dL (ref 0.0–1.2)
CO2: 19 mmol/L — ABNORMAL LOW (ref 20–29)
Calcium: 10 mg/dL (ref 8.6–10.2)
Chloride: 107 mmol/L — ABNORMAL HIGH (ref 96–106)
Creatinine, Ser: 0.98 mg/dL (ref 0.76–1.27)
Globulin, Total: 2.4 g/dL (ref 1.5–4.5)
Glucose: 146 mg/dL — ABNORMAL HIGH (ref 70–99)
Potassium: 5.1 mmol/L (ref 3.5–5.2)
Sodium: 142 mmol/L (ref 134–144)
Total Protein: 6.9 g/dL (ref 6.0–8.5)
eGFR: 77 mL/min/{1.73_m2} (ref >59)

## 2023-05-16 LAB — CBC WITH DIFFERENTIAL/PLATELET
Basophils Absolute: 0.1 10*3/uL (ref 0.0–0.2)
Basos: 1 %
EOS (ABSOLUTE): 0.4 10*3/uL (ref 0.0–0.4)
Eos: 6 %
Hematocrit: 42.8 % (ref 37.5–51.0)
Hemoglobin: 14.2 g/dL (ref 13.0–17.7)
Immature Grans (Abs): 0 10*3/uL (ref 0.0–0.1)
Immature Granulocytes: 0 %
Lymphocytes Absolute: 2.2 10*3/uL (ref 0.7–3.1)
Lymphs: 38 %
MCH: 30.5 pg (ref 26.6–33.0)
MCHC: 33.2 g/dL (ref 31.5–35.7)
MCV: 92 fL (ref 79–97)
Monocytes Absolute: 0.4 10*3/uL (ref 0.1–0.9)
Monocytes: 8 %
Neutrophils Absolute: 2.7 10*3/uL (ref 1.4–7.0)
Neutrophils: 47 %
Platelets: 235 10*3/uL (ref 150–450)
RBC: 4.65 x10E6/uL (ref 4.14–5.80)
RDW: 13.7 % (ref 11.6–15.4)
WBC: 5.7 10*3/uL (ref 3.4–10.8)

## 2023-05-16 LAB — LIPID PANEL
Cholesterol, Total: 95 mg/dL — ABNORMAL LOW (ref 100–199)
HDL: 37 mg/dL — ABNORMAL LOW (ref >39)
LDL CALC COMMENT:: 2.6 ratio (ref 0.0–5.0)
LDL Chol Calc (NIH): 40 mg/dL (ref 0–99)
Triglycerides: 91 mg/dL (ref 0–149)
VLDL Cholesterol Cal: 18 mg/dL (ref 5–40)

## 2023-05-18 NOTE — Progress Notes (Signed)
Hello Jesus Curtis,  Your lab result is normal and/or stable.Some minor variations that are not significant are commonly marked abnormal, but do not represent any medical problem for you.  Best regards, Mechele Claude, M.D.

## 2023-05-22 ENCOUNTER — Other Ambulatory Visit: Payer: Self-pay | Admitting: Family Medicine

## 2023-05-22 DIAGNOSIS — I152 Hypertension secondary to endocrine disorders: Secondary | ICD-10-CM

## 2023-05-22 DIAGNOSIS — I2510 Atherosclerotic heart disease of native coronary artery without angina pectoris: Secondary | ICD-10-CM

## 2023-05-22 DIAGNOSIS — E1159 Type 2 diabetes mellitus with other circulatory complications: Secondary | ICD-10-CM

## 2023-07-24 ENCOUNTER — Other Ambulatory Visit: Payer: Self-pay | Admitting: Family Medicine

## 2023-07-24 DIAGNOSIS — I2510 Atherosclerotic heart disease of native coronary artery without angina pectoris: Secondary | ICD-10-CM

## 2023-07-24 DIAGNOSIS — E1159 Type 2 diabetes mellitus with other circulatory complications: Secondary | ICD-10-CM

## 2023-08-13 ENCOUNTER — Ambulatory Visit: Payer: Medicare Other | Admitting: Family Medicine

## 2023-08-13 ENCOUNTER — Encounter: Payer: Self-pay | Admitting: Family Medicine

## 2023-08-13 VITALS — BP 145/73 | HR 56 | Temp 98.2°F | Ht 69.0 in | Wt 172.0 lb

## 2023-08-13 DIAGNOSIS — Z7984 Long term (current) use of oral hypoglycemic drugs: Secondary | ICD-10-CM | POA: Diagnosis not present

## 2023-08-13 DIAGNOSIS — E785 Hyperlipidemia, unspecified: Secondary | ICD-10-CM | POA: Diagnosis not present

## 2023-08-13 DIAGNOSIS — E1159 Type 2 diabetes mellitus with other circulatory complications: Secondary | ICD-10-CM

## 2023-08-13 DIAGNOSIS — I10 Essential (primary) hypertension: Secondary | ICD-10-CM | POA: Diagnosis not present

## 2023-08-13 DIAGNOSIS — I2510 Atherosclerotic heart disease of native coronary artery without angina pectoris: Secondary | ICD-10-CM | POA: Diagnosis not present

## 2023-08-13 DIAGNOSIS — E1169 Type 2 diabetes mellitus with other specified complication: Secondary | ICD-10-CM

## 2023-08-13 DIAGNOSIS — I152 Hypertension secondary to endocrine disorders: Secondary | ICD-10-CM

## 2023-08-13 DIAGNOSIS — E119 Type 2 diabetes mellitus without complications: Secondary | ICD-10-CM

## 2023-08-13 LAB — CMP14+EGFR
ALT: 23 IU/L (ref 0–44)
AST: 23 IU/L (ref 0–40)
Albumin: 4.6 g/dL (ref 3.7–4.7)
Alkaline Phosphatase: 70 IU/L (ref 44–121)
BUN/Creatinine Ratio: 12 (ref 10–24)
BUN: 11 mg/dL (ref 8–27)
Bilirubin Total: 0.5 mg/dL (ref 0.0–1.2)
CO2: 20 mmol/L (ref 20–29)
Calcium: 9.7 mg/dL (ref 8.6–10.2)
Chloride: 105 mmol/L (ref 96–106)
Creatinine, Ser: 0.94 mg/dL (ref 0.76–1.27)
Globulin, Total: 2.1 g/dL (ref 1.5–4.5)
Glucose: 135 mg/dL — ABNORMAL HIGH (ref 70–99)
Potassium: 5.3 mmol/L — ABNORMAL HIGH (ref 3.5–5.2)
Sodium: 141 mmol/L (ref 134–144)
Total Protein: 6.7 g/dL (ref 6.0–8.5)
eGFR: 81 mL/min/{1.73_m2} (ref >59)

## 2023-08-13 LAB — LIPID PANEL
Chol/HDL Ratio: 2.7 ratio (ref 0.0–5.0)
Cholesterol, Total: 88 mg/dL — ABNORMAL LOW (ref 100–199)
HDL: 33 mg/dL — ABNORMAL LOW (ref >39)
LDL Chol Calc (NIH): 38 mg/dL (ref 0–99)
Triglycerides: 82 mg/dL (ref 0–149)
VLDL Cholesterol Cal: 17 mg/dL (ref 5–40)

## 2023-08-13 LAB — BAYER DCA HB A1C WAIVED: HB A1C (BAYER DCA - WAIVED): 7.8 % — ABNORMAL HIGH (ref 4.8–5.6)

## 2023-08-13 MED ORDER — ATORVASTATIN CALCIUM 80 MG PO TABS
80.0000 mg | ORAL_TABLET | Freq: Every day | ORAL | 0 refills | Status: DC
Start: 2023-08-13 — End: 2023-11-12

## 2023-08-13 MED ORDER — EMPAGLIFLOZIN 25 MG PO TABS: 25.0000 mg | ORAL_TABLET | Freq: Every day | ORAL | 3 refills | Status: AC

## 2023-08-13 MED ORDER — METOPROLOL TARTRATE 100 MG PO TABS: 100.0000 mg | ORAL_TABLET | Freq: Two times a day (BID) | ORAL | 3 refills | Status: AC

## 2023-08-13 NOTE — Progress Notes (Signed)
 Subjective:  Patient ID: Jesus Curtis,  male    DOB: [birth year]  Age: 83 y.o.    CC: Medical Management of Chronic Issues   HPI Jesus Curtis presents for  follow-up of hypertension. Patient has no history of headache chest pain or shortness of breath or recent cough. Patient also denies symptoms of TIA such as numbness weakness lateralizing. Patient denies side effects from medication. States taking it regularly.  Patient also  in for follow-up of elevated cholesterol. Doing well without complaints on current medication. Denies side effects  including myalgia and arthralgia and nausea. Also in today for liver function testing. Currently no chest pain, shortness of breath or other cardiovascular related symptoms noted.  Follow-up of diabetes. Patient does check blood sugar at home. Readings run between 100 and 110 Patient denies symptoms such as excessive hunger or urinary frequency, excessive hunger, nausea No significant hypoglycemic spells noted. Medications reviewed. Pt reports taking them regularly. Pt. denies complication/adverse reaction today.    History Jesus Curtis has a past medical history of Abnormal stress test, Aortic atherosclerosis (HCC), Chest pain, Elevated hemoglobin A1c, Elevated troponin, Hypertension, Nausea & vomiting, Snake bite, SVT (supraventricular tachycardia) (HCC), and Syncope.   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   His family history includes Heart attack in his brother, brother, father, and mother; Heart disease in his brother, father, mother, and paternal grandfather; Hypertension in his father, mother, and paternal grandfather; Stroke in his brother, maternal grandmother, and paternal grandmother.Jesus Curtis reports that Jesus Curtis has never smoked. Jesus Curtis has never used smokeless tobacco. Jesus Curtis reports that Jesus Curtis does  not drink alcohol and does not use drugs.  Current Outpatient Medications on File Prior to Visit  Medication Sig Dispense Refill   acetaminophen  (TYLENOL ) 500 MG tablet Take 1,000 mg by mouth every 6 (six) hours as needed for mild pain.     amLODipine  (NORVASC ) 10 MG tablet Take 1 tablet (10 mg total) by mouth daily. 180 tablet 3   aspirin  EC 81 MG tablet Take 81 mg by mouth daily.     GINSENG PO Take 2 tablets by mouth daily.     glucose blood (ONE TOUCH ULTRA TEST) test strip Use to check blood sugars up to four times a day 400 each 1   metFORMIN  (GLUCOPHAGE -XR) 750 MG 24 hr tablet Take 2 tablets (1,500 mg total) by mouth daily with breakfast. 180 tablet 3   Multiple Vitamin (ONE-A-DAY MENS PO) Take 1 tablet by mouth daily.     pantoprazole  (PROTONIX ) 40 MG tablet TAKE 1 TABLET BY MOUTH DAILY FOR STOMACH 90 tablet 3   valsartan  (DIOVAN ) 320 MG tablet Take 1 tablet (320 mg total) by mouth daily. 90 tablet 3   No current facility-administered medications on file prior to visit.    ROS Review of Systems  Constitutional:  Negative for fever.  Respiratory:  Negative for shortness of breath.   Cardiovascular:  Negative for chest pain.  Musculoskeletal:  Negative for arthralgias.  Skin:  Negative for rash.    Objective:  BP (!) 145/73   Pulse (!) 56   Temp 98.2 F (36.8 C)   Ht 5\' 9"  (1.753 m)   Wt 172 lb (78 kg)   SpO2 96%   BMI 25.40 kg/m   BP Readings from Last 3 Encounters:  08/13/23 (!) 145/73  05/15/23 (!) 159/77  02/20/23 Aaron Aas)  145/85    Wt Readings from Last 3 Encounters:  08/13/23 172 lb (78 kg)  05/15/23 173 lb (78.5 kg)  02/20/23 172 lb (78 kg)    Lab Results  Component Value Date   HGBA1C 7.1 (H) 05/15/2023   HGBA1C 7.0 (H) 01/29/2023   HGBA1C 7.9 (H) 10/13/2022    Physical Exam Vitals reviewed.  Constitutional:      Appearance: Jesus Curtis is well-developed.  HENT:     Head: Normocephalic and atraumatic.     Right Ear: External ear normal.     Left Ear:  External ear normal.     Mouth/Throat:     Pharynx: No oropharyngeal exudate or posterior oropharyngeal erythema.  Eyes:     Pupils: Pupils are equal, round, and reactive to light.  Cardiovascular:     Rate and Rhythm: Normal rate and regular rhythm.     Heart sounds: No murmur heard. Pulmonary:     Effort: No respiratory distress.     Breath sounds: Normal breath sounds.  Musculoskeletal:     Cervical back: Normal range of motion and neck supple.  Neurological:     Mental Status: Jesus Curtis is alert and oriented to person, place, and time.         Assessment & Plan:  Type 2 diabetes mellitus without complication, without long-term current use of insulin  (HCC) -     Bayer DCA Hb A1c Waived  Essential hypertension -     CMP14+EGFR  Hyperlipidemia associated with type 2 diabetes mellitus (HCC) -     Lipid panel -     CMP14+EGFR  High blood pressure associated with diabetes (HCC) -     Bayer DCA Hb A1c Waived -     Atorvastatin  Calcium ; Take 1 tablet (80 mg total) by mouth daily.  Dispense: 90 tablet; Refill: 0  ASCVD (arteriosclerotic cardiovascular disease) -     Lipid panel -     CMP14+EGFR -     Atorvastatin  Calcium ; Take 1 tablet (80 mg total) by mouth daily.  Dispense: 90 tablet; Refill: 0  Other orders -     Empagliflozin ; Take 1 tablet (25 mg total) by mouth daily before breakfast.  Dispense: 90 tablet; Refill: 3 -     Metoprolol  Tartrate; Take 1 tablet (100 mg total) by mouth 2 (two) times daily.  Dispense: 180 tablet; Refill: 3    Follow-up: Return in about 3 months (around 11/12/2023).  Roise Cleaver, M.D.

## 2023-08-16 NOTE — Progress Notes (Signed)
 Hello Dyami,  Your lab result is normal and/or stable.Some minor variations that are not significant are commonly marked abnormal, but do not represent any medical problem for you.  Best regards, Mechele Claude, M.D.

## 2023-10-26 ENCOUNTER — Ambulatory Visit: Attending: Cardiology | Admitting: Cardiology

## 2023-10-26 ENCOUNTER — Encounter: Payer: Self-pay | Admitting: Cardiology

## 2023-10-26 VITALS — BP 122/82 | HR 66 | Ht 69.0 in | Wt 170.2 lb

## 2023-10-26 DIAGNOSIS — E782 Mixed hyperlipidemia: Secondary | ICD-10-CM | POA: Diagnosis not present

## 2023-10-26 DIAGNOSIS — I10 Essential (primary) hypertension: Secondary | ICD-10-CM

## 2023-10-26 DIAGNOSIS — I2510 Atherosclerotic heart disease of native coronary artery without angina pectoris: Secondary | ICD-10-CM | POA: Diagnosis not present

## 2023-10-26 NOTE — Patient Instructions (Signed)
 Medication Instructions:  Continue all current medications.   Labwork: none  Testing/Procedures: none  Follow-Up: 6 months   Any Other Special Instructions Will Be Listed Below (If Applicable).   If you need a refill on your cardiac medications before your next appointment, please call your pharmacy.

## 2023-10-26 NOTE — Progress Notes (Signed)
 Clinical Summary Jesus Curtis is a 83 y.o.male seen for the following medical problems.    1. 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 HTN - on chlorthalidone  had orthoststic dizzziness, stopped taking  -coreg  caused headache, went back to lopressor  - valsartan  increased to 320mg  by pcp   - compliant with meds - home bp's 120s/60s          3. Hyperlipidemia  - 06/2022 TC 102 TG 82 HDL 39 LDL 46 01/2023 TC 95 TG 81 HDL 37 LDL 41 - 07/2023 TC 88 TG 82 HDL 33 LDL 38 - he is on atorva 80mg  daily   4. DM2 - followed by pcp Past Medical History:  Diagnosis Date   Abnormal stress test    Aortic atherosclerosis (HCC)    Chest pain    Elevated hemoglobin A1c    Elevated troponin    Hypertension    Nausea & vomiting    Snake bite    AS A CHILD HAD PMH   SVT (supraventricular tachycardia) (HCC)    Syncope      Allergies  Allergen Reactions   Jesus Curtis [Jesus Curtis] Other (See Comments)    goes wild   Carvedilol  Other (See Comments)    Dizziness, and Headache     Current Outpatient Medications  Medication Sig Dispense Refill   acetaminophen  (TYLENOL ) 500 MG tablet Take 1,000 mg by mouth every 6 (six) hours as needed for mild pain.     amLODipine  (NORVASC ) 10 MG tablet Take 1 tablet (10 mg total) by mouth daily. 180 tablet 3   aspirin  EC 81 MG tablet Take 81 mg by mouth daily.     atorvastatin  (LIPITOR ) 80 MG tablet Take 1 tablet (80 mg total) by mouth daily. 90 tablet 0   empagliflozin  (JARDIANCE ) 25 MG TABS tablet Take  1 tablet (25 mg total) by mouth daily before breakfast. 90 tablet 3   GINSENG PO Take 2 tablets by mouth daily.     glucose blood (ONE TOUCH ULTRA TEST) test strip Use to check blood sugars up to four times a day 400 each 1   metFORMIN  (GLUCOPHAGE -XR) 750 MG 24 hr tablet Take 2 tablets (1,500 mg total) by mouth daily with breakfast. 180 tablet 3   metoprolol  tartrate (LOPRESSOR ) 100 MG tablet Take 1 tablet (100 mg total) by mouth 2 (two) times daily. 180 tablet 3   Multiple Vitamin (ONE-A-DAY MENS PO) Take 1 tablet by mouth daily.     pantoprazole  (PROTONIX ) 40 MG tablet TAKE 1 TABLET BY MOUTH DAILY FOR STOMACH 90 tablet 3   valsartan  (DIOVAN ) 320 MG tablet Take 1 tablet (320 mg total) by mouth daily. 90 tablet 3   No current facility-administered medications for this visit.     Past Surgical History:  Procedure Laterality Date   CORONARY ARTERY BYPASS GRAFT N/A 11/24/2016   Procedure: CORONARY ARTERY BYPASS GRAFTING (CABG), ON PUMP, TIMES FOUR, USING LEFT INTERNAL MAMMARY ARTERY AND ENDOSCOPICALLY HARVESTED RIGHT  GREATER SAPHENOUS VEIN;  Surgeon: Jesus Hanford Coy, MD;  Location: [facility] OR;  Service: Open Heart Surgery;  Laterality: N/A;  LIMA to LAD SVG to PDA SVG to OM1 SVG to Diag1   LEFT HEART CATH AND CORONARY ANGIOGRAPHY N/A 11/21/2016   Procedure: LEFT HEART CATH AND CORONARY ANGIOGRAPHY;  Surgeon: Jesus Debby LABOR, MD;  Location: [facility] INVASIVE CV LAB;  Service: Cardiovascular;  Laterality: N/A;   TEE WITHOUT CARDIOVERSION N/A 11/24/2016   Procedure: TRANSESOPHAGEAL ECHOCARDIOGRAM (TEE);  Surgeon: Jesus Hanford, Coy, MD;  Location: [facility] OR;  Service: Open Heart Surgery;  Laterality: N/A;     Allergies  Allergen Reactions   Jesus Curtis [Jesus Curtis] Other (See Comments)    goes wild   Carvedilol  Other (See Comments)    Dizziness, and Headache      Family History  Problem Relation Age of Onset   Heart attack Mother    Heart disease Mother    Hypertension Mother    Heart attack Father     Heart disease Father    Hypertension Father    Heart attack Brother    Heart disease Brother    Stroke Brother    Stroke Maternal Grandmother    Stroke Paternal Grandmother    Heart disease Paternal Grandfather    Hypertension Paternal Grandfather    Heart attack Brother      Social History Jesus Curtis reports that he has never smoked. He has never used smokeless tobacco. Jesus Curtis reports no history of alcohol use.    Physical Examination Today's Vitals   10/26/23 1355  BP: 122/82  Pulse: 66  SpO2: 94%  Weight: 170 lb 3.2 oz (77.2 kg)  Height: 5' 9 (1.753 m)   Body mass index is 25.13 kg/m.  Gen: resting comfortably, no acute distress HEENT: no scleral icterus, pupils equal round and reactive, no palptable cervical adenopathy,  CV: RRR, no m/rg, no jvd Resp: Clear to auscultation bilaterally GI: abdomen is soft, non-tender, non-distended, normal bowel sounds, no hepatosplenomegaly MSK: extremities are warm, no edema.  Skin: warm, no rash Neuro:  no focal deficits Psych: appropriate affect   Diagnostic Studies  11/2016 cath   LM lesion, 30 %stenosed. Ost LAD to Prox LAD lesion, 30 %stenosed. Mid LAD lesion, 90 %stenosed. 1st Mrg lesion, 50 %stenosed. Ost Cx to Prox Cx lesion, 70 %stenosed. Prox Cx to Dist Cx lesion, 70 %stenosed. Ost RCA to Prox RCA lesion, 80 %stenosed. Mid RCA lesion, 100 %stenosed. LV end diastolic pressure is normal.     11/2016 echo     - Left ventricle: The cavity size was normal. Systolic function was   normal. The estimated ejection fraction was in the range of 55%   to 60%. Wall motion was normal; there were no regional wall   motion abnormalities. Features are consistent with a pseudonormal   left ventricular filling pattern, with concomitant abnormal   relaxation and increased filling pressure (grade 2 diastolic   dysfunction). Doppler parameters are consistent with elevated   mean left atrial filling pressure. - Left  atrium: The atrium was mildly dilated. - Pericardium, extracardiac: A trivial pericardial effusion was   identified.     11/2016 carotid US  Findings suggest 1-39% internal carotid artery stenosis bilaterally. Vertebral arteries are patent with antegrade flow.       Assessment and Plan   1. CAD - denies any symptoms, continue current meds   2. HTN - at goal, continue current meds   3.Hyperlipidemia - LDL is well controlled,  continue current meds  F/u 6 months     Dorn PHEBE Ross, M.D.

## 2023-11-12 ENCOUNTER — Ambulatory Visit: Admitting: Family Medicine

## 2023-11-12 ENCOUNTER — Encounter: Payer: Self-pay | Admitting: Family Medicine

## 2023-11-12 ENCOUNTER — Ambulatory Visit: Payer: Self-pay | Admitting: Family Medicine

## 2023-11-12 VITALS — BP 159/74 | HR 57 | Temp 98.2°F | Ht 69.0 in | Wt 170.0 lb

## 2023-11-12 DIAGNOSIS — I10 Essential (primary) hypertension: Secondary | ICD-10-CM

## 2023-11-12 DIAGNOSIS — E785 Hyperlipidemia, unspecified: Secondary | ICD-10-CM | POA: Diagnosis not present

## 2023-11-12 DIAGNOSIS — E119 Type 2 diabetes mellitus without complications: Secondary | ICD-10-CM | POA: Diagnosis not present

## 2023-11-12 DIAGNOSIS — I2510 Atherosclerotic heart disease of native coronary artery without angina pectoris: Secondary | ICD-10-CM | POA: Diagnosis not present

## 2023-11-12 DIAGNOSIS — I152 Hypertension secondary to endocrine disorders: Secondary | ICD-10-CM

## 2023-11-12 DIAGNOSIS — E1159 Type 2 diabetes mellitus with other circulatory complications: Secondary | ICD-10-CM

## 2023-11-12 DIAGNOSIS — E1169 Type 2 diabetes mellitus with other specified complication: Secondary | ICD-10-CM

## 2023-11-12 LAB — BAYER DCA HB A1C WAIVED: HB A1C (BAYER DCA - WAIVED): 7.3 % — ABNORMAL HIGH (ref 4.8–5.6)

## 2023-11-12 MED ORDER — AMLODIPINE BESYLATE 10 MG PO TABS: 10.0000 mg | ORAL_TABLET | Freq: Every day | ORAL | 3 refills | Status: AC

## 2023-11-12 MED ORDER — ATORVASTATIN CALCIUM 80 MG PO TABS: 80.0000 mg | ORAL_TABLET | Freq: Every day | ORAL | 0 refills | Status: DC

## 2023-11-12 NOTE — Progress Notes (Signed)
 Subjective:  Patient ID: Jesus Curtis,  male    DOB: 1940/08/09  Age: 83 y.o.    CC: Diabetes   HPI Jesus Curtis presents for  follow-up of hypertension. Patient has no history of headache chest pain or shortness of breath or recent cough. Patient also denies symptoms of TIA such as numbness weakness lateralizing. Patient denies side effects from medication. States taking it regularly.  Patient also  in for follow-up of elevated cholesterol. Doing well without complaints on current medication. Denies side effects  including myalgia and arthralgia and nausea. Also in today for liver function testing. Currently no chest pain, shortness of breath or other cardiovascular related symptoms noted.  Follow-up of diabetes. Patient does check blood sugar at home. Readings run between 80-100 ` Patient denies symptoms such as excessive hunger or urinary frequency, excessive hunger, nausea No significant hypoglycemic spells noted. Medications reviewed. Pt reports taking them regularly. Pt. denies complication/adverse reaction today.    History Jesus Curtis has a past medical history of Abnormal stress test, Aortic atherosclerosis (HCC), Chest pain, Elevated hemoglobin A1c, Elevated troponin, Hypertension, Nausea & vomiting, Snake bite, SVT (supraventricular tachycardia) (HCC), and Syncope.   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   His family history includes Heart attack in his brother, brother, father, and mother; Heart disease in his brother, father, mother, and paternal grandfather; Hypertension in his father, mother, and paternal grandfather; Stroke in his brother, maternal grandmother, and paternal grandmother.He reports that he has never smoked. He has never used smokeless tobacco. He reports that he does not drink alcohol and does not  use drugs.  Current Outpatient Medications on File Prior to Visit  Medication Sig Dispense Refill   acetaminophen  (TYLENOL ) 500 MG tablet Take 1,000 mg by mouth every 6 (six) hours as needed for mild pain.     aspirin  EC 81 MG tablet Take 81 mg by mouth daily.     empagliflozin  (JARDIANCE ) 25 MG TABS tablet Take 1 tablet (25 mg total) by mouth daily before breakfast. 90 tablet 3   GINSENG PO Take 2 tablets by mouth daily.     glucose blood (ONE TOUCH ULTRA TEST) test strip Use to check blood sugars up to four times a day 400 each 1   metFORMIN  (GLUCOPHAGE -XR) 750 MG 24 hr tablet Take 2 tablets (1,500 mg total) by mouth daily with breakfast. 180 tablet 3   metoprolol  tartrate (LOPRESSOR ) 100 MG tablet Take 1 tablet (100 mg total) by mouth 2 (two) times daily. 180 tablet 3   Multiple Vitamin (ONE-A-DAY MENS PO) Take 1 tablet by mouth daily.     pantoprazole  (PROTONIX ) 40 MG tablet TAKE 1 TABLET BY MOUTH DAILY FOR STOMACH 90 tablet 3   valsartan  (DIOVAN ) 320 MG tablet Take 1 tablet (320 mg total) by mouth daily. 90 tablet 3   No current facility-administered medications on file prior to visit.    ROS Review of Systems  Constitutional: Negative.   HENT: Negative.    Eyes:  Negative for visual disturbance.  Respiratory:  Negative for cough and shortness of breath.   Cardiovascular:  Negative for chest pain and leg swelling.  Gastrointestinal:  Negative for abdominal pain, diarrhea, nausea and vomiting.  Genitourinary:  Negative for difficulty urinating.  Musculoskeletal:  Negative for arthralgias and myalgias.  Skin:  Negative for rash.  Neurological:  Negative for headaches.  Psychiatric/Behavioral:  Negative for sleep disturbance.     Objective:  BP (!) 159/74   Pulse (!) 57   Temp 98.2 F (36.8 C)   Ht 5' 9 (1.753 m)   Wt 170 lb (77.1 kg)   SpO2 98%   BMI 25.10 kg/m   BP Readings from Last 3 Encounters:  11/12/23 (!) 159/74  10/26/23 122/82  08/13/23 (!) 145/73    Wt  Readings from Last 3 Encounters:  11/12/23 170 lb (77.1 kg)  10/26/23 170 lb 3.2 oz (77.2 kg)  08/13/23 172 lb (78 kg)    Lab Results  Component Value Date   HGBA1C 7.3 (H) 11/12/2023   HGBA1C 7.8 (H) 08/13/2023   HGBA1C 7.1 (H) 05/15/2023    Physical Exam Vitals reviewed.  Constitutional:      Appearance: He is well-developed.  HENT:     Head: Normocephalic and atraumatic.     Right Ear: External ear normal.     Left Ear: External ear normal.     Mouth/Throat:     Pharynx: No oropharyngeal exudate or posterior oropharyngeal erythema.  Eyes:     Pupils: Pupils are equal, round, and reactive to light.  Cardiovascular:     Rate and Rhythm: Normal rate and regular rhythm.     Heart sounds: No murmur heard. Pulmonary:     Effort: No respiratory distress.     Breath sounds: Normal breath sounds.  Musculoskeletal:     Cervical back: Normal range of motion and neck supple.  Neurological:     Mental Status: He is alert and oriented to person, place, and time.        Diabetic foot exam was performed with the following findings:   No deformities, ulcerations, or other skin breakdown Normal sensation of 10g monofilament Intact posterior tibialis and dorsalis pedis pulses    Assessment & Plan:  Hyperlipidemia associated with type 2 diabetes mellitus (HCC) -     Lipid panel -     CMP14+EGFR  Type 2 diabetes mellitus without complication, without long-term current use of insulin  (HCC) -     Bayer DCA Hb A1c Waived  Essential hypertension -     amLODIPine  Besylate; Take 1 tablet (10 mg total) by mouth daily.  Dispense: 180 tablet; Refill: 3  High blood pressure associated with diabetes (HCC) -     Atorvastatin  Calcium ; Take 1 tablet (80 mg total) by mouth daily.  Dispense: 90 tablet; Refill: 0  ASCVD (arteriosclerotic cardiovascular disease) -     Atorvastatin  Calcium ; Take 1 tablet (80 mg total) by mouth daily.  Dispense: 90 tablet; Refill: 0    Follow-up: Return in  about 3 months (around 02/12/2024) for diabetes, hypertension.  Butler Der, M.D.

## 2023-11-13 LAB — LIPID PANEL
Chol/HDL Ratio: 2.8 ratio (ref 0.0–5.0)
Cholesterol, Total: 91 mg/dL — ABNORMAL LOW (ref 100–199)
HDL: 33 mg/dL — ABNORMAL LOW (ref >39)
LDL Chol Calc (NIH): 41 mg/dL (ref 0–99)
Triglycerides: 83 mg/dL (ref 0–149)
VLDL Cholesterol Cal: 17 mg/dL (ref 5–40)

## 2023-11-13 LAB — CMP14+EGFR
ALT: 19 IU/L (ref 0–44)
AST: 17 IU/L (ref 0–40)
Albumin: 4.5 g/dL (ref 3.7–4.7)
Alkaline Phosphatase: 73 IU/L (ref 44–121)
BUN/Creatinine Ratio: 11 (ref 10–24)
BUN: 10 mg/dL (ref 8–27)
Bilirubin Total: 0.5 mg/dL (ref 0.0–1.2)
CO2: 20 mmol/L (ref 20–29)
Calcium: 9.8 mg/dL (ref 8.6–10.2)
Chloride: 106 mmol/L (ref 96–106)
Creatinine, Ser: 0.91 mg/dL (ref 0.76–1.27)
Globulin, Total: 2.1 g/dL (ref 1.5–4.5)
Glucose: 120 mg/dL — ABNORMAL HIGH (ref 70–99)
Potassium: 5.2 mmol/L (ref 3.5–5.2)
Sodium: 142 mmol/L (ref 134–144)
Total Protein: 6.6 g/dL (ref 6.0–8.5)
eGFR: 85 mL/min/1.73 (ref >59)

## 2023-11-16 NOTE — Progress Notes (Signed)
 Hello Dyami,  Your lab result is normal and/or stable.Some minor variations that are not significant are commonly marked abnormal, but do not represent any medical problem for you.  Best regards, Mechele Claude, M.D.

## 2024-02-09 NOTE — Progress Notes (Signed)
 Jesus Curtis                                          MRN: [id number]   02/09/2024   The [facility] Quality Team Specialist reviewed this patient medical record for the purposes of chart review for care gap closure. The following were reviewed: chart review for care gap closure-kidney health evaluation for diabetes:eGFR  and uACR.    [facility] Quality Team

## 2024-02-23 ENCOUNTER — Ambulatory Visit: Payer: Self-pay | Admitting: Family Medicine

## 2024-02-23 ENCOUNTER — Encounter: Payer: Self-pay | Admitting: Family Medicine

## 2024-02-23 VITALS — BP 134/68 | HR 70 | Temp 98.1°F | Ht 69.0 in | Wt 170.0 lb

## 2024-02-23 DIAGNOSIS — E1169 Type 2 diabetes mellitus with other specified complication: Secondary | ICD-10-CM | POA: Diagnosis not present

## 2024-02-23 DIAGNOSIS — I152 Hypertension secondary to endocrine disorders: Secondary | ICD-10-CM

## 2024-02-23 DIAGNOSIS — E1159 Type 2 diabetes mellitus with other circulatory complications: Secondary | ICD-10-CM

## 2024-02-23 DIAGNOSIS — Z7984 Long term (current) use of oral hypoglycemic drugs: Secondary | ICD-10-CM

## 2024-02-23 DIAGNOSIS — E119 Type 2 diabetes mellitus without complications: Secondary | ICD-10-CM

## 2024-02-23 DIAGNOSIS — I2510 Atherosclerotic heart disease of native coronary artery without angina pectoris: Secondary | ICD-10-CM

## 2024-02-23 DIAGNOSIS — I10 Essential (primary) hypertension: Secondary | ICD-10-CM | POA: Diagnosis not present

## 2024-02-23 DIAGNOSIS — E785 Hyperlipidemia, unspecified: Secondary | ICD-10-CM

## 2024-02-23 LAB — BAYER DCA HB A1C WAIVED: HB A1C (BAYER DCA - WAIVED): 7.6 % — ABNORMAL HIGH (ref 4.8–5.6)

## 2024-02-23 MED ORDER — ATORVASTATIN CALCIUM 80 MG PO TABS: 80.0000 mg | ORAL_TABLET | Freq: Every day | ORAL | 0 refills | Status: DC

## 2024-02-23 MED ORDER — VALSARTAN 320 MG PO TABS: 320.0000 mg | ORAL_TABLET | Freq: Every day | ORAL | 3 refills | Status: AC

## 2024-02-23 MED ORDER — METFORMIN HCL ER 750 MG PO TB24: 1500.0000 mg | ORAL_TABLET | Freq: Every day | ORAL | 3 refills | Status: AC

## 2024-02-23 MED ORDER — PANTOPRAZOLE SODIUM 40 MG PO TBEC: DELAYED_RELEASE_TABLET | ORAL | 3 refills | Status: AC

## 2024-02-23 NOTE — Progress Notes (Signed)
 Subjective:  Patient ID: Jesus Curtis,  male    DOB: [birth year]  Age: 83 y.o.    CC: Diabetes   HPI Jesus Curtis presents for  follow-up of hypertension. Patient has no history of headache chest pain or shortness of breath or recent cough. Patient also denies symptoms of TIA such as numbness weakness lateralizing. Patient denies side effects from medication. States taking it regularly.  Patient also  in for follow-up of elevated cholesterol. Doing well without complaints on current medication. Denies side effects  including myalgia and arthralgia and nausea. Also in today for liver function testing. Currently no chest pain, shortness of breath or other cardiovascular related symptoms noted.  Follow-up of diabetes.Patient denies symptoms such as excessive hunger or urinary frequency, excessive hunger, nausea No significant hypoglycemic spells noted. Medications reviewed. Pt reports taking them regularly. Pt. denies complication/adverse reaction today.  Discussed the use of AI scribe software for clinical note transcription with the patient, who gave verbal consent to proceed.  History of Present Illness  Discussed the use of AI scribe software for clinical note transcription with the patient, who gave verbal consent to proceed.  History of Present Illness   Jesus Curtis is an 83 year old male with diabetes who presents for diabetes management.  His current HbA1c is 7.6%. He is actively working on dietary changes to improve blood sugar control, such as avoiding starches like bread, pasta, potatoes, rice, and corn, and limiting fruit intake to two bananas a day. He has been consuming a lot of salads and turnip greens.  He underwent open-heart surgery seven years ago with multiple bypass grafts placed. He has not smoked since 1964 or 1965, when he quit due to the rising cost of cigarettes. He smoked about a pack a week before quitting.  He is currently on multiple  medications, which he reports are agreeing with him, and he is set to have several refilled. He has not had any stents placed since his surgery.  He eats well, sleeps well, and works well.        History Jesus Curtis has a past medical history of Abnormal stress test, Aortic atherosclerosis, Chest pain, Elevated hemoglobin A1c, Elevated troponin, Hypertension, Nausea & vomiting, Snake bite, SVT (supraventricular tachycardia), and Syncope.   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   His family history includes Heart attack in his brother, brother, father, and mother; Heart disease in his brother, father, mother, and paternal grandfather; Hypertension in his father, mother, and paternal grandfather; Stroke in his brother, maternal grandmother, and paternal grandmother.He reports that he has never smoked. He has never used smokeless tobacco. He reports that he does not drink alcohol and does not use drugs.  Current Outpatient Medications on File Prior to Visit  Medication Sig Dispense Refill   acetaminophen  (TYLENOL ) 500 MG tablet Take 1,000 mg by mouth every 6 (six) hours as needed for mild pain.     amLODipine  (NORVASC ) 10 MG tablet Take 1 tablet (10 mg total) by mouth daily. 180 tablet 3   aspirin  EC 81 MG tablet Take 81 mg by mouth daily.     empagliflozin  (JARDIANCE ) 25 MG TABS tablet Take 1 tablet (25 mg total) by mouth daily before breakfast. 90 tablet 3   GINSENG PO Take 2 tablets by mouth daily.     glucose blood (ONE TOUCH ULTRA TEST) test  strip Use to check blood sugars up to four times a day 400 each 1   metoprolol  tartrate (LOPRESSOR ) 100 MG tablet Take 1 tablet (100 mg total) by mouth 2 (two) times daily. 180 tablet 3   Multiple Vitamin (ONE-A-DAY MENS PO) Take 1 tablet by mouth daily.     No current facility-administered medications  on file prior to visit.    ROS Review of Systems  Constitutional: Negative.   HENT: Negative.    Eyes:  Negative for visual disturbance.  Respiratory:  Negative for cough and shortness of breath.   Cardiovascular:  Negative for chest pain and leg swelling.  Gastrointestinal:  Negative for abdominal pain, diarrhea, nausea and vomiting.  Genitourinary:  Negative for difficulty urinating.  Musculoskeletal:  Negative for arthralgias and myalgias.  Skin:  Negative for rash.  Neurological:  Negative for headaches.  Psychiatric/Behavioral:  Negative for sleep disturbance.     Objective:  BP 134/68   Pulse 70   Temp 98.1 F (36.7 C)   Ht 5' 9 (1.753 m)   Wt 170 lb (77.1 kg)   SpO2 96%   BMI 25.10 kg/m   BP Readings from Last 3 Encounters:  02/23/24 134/68  11/12/23 (!) 159/74  10/26/23 122/82    Wt Readings from Last 3 Encounters:  02/23/24 170 lb (77.1 kg)  11/12/23 170 lb (77.1 kg)  10/26/23 170 lb 3.2 oz (77.2 kg)    Lab Results  Component Value Date   HGBA1C 7.3 (H) 11/12/2023   HGBA1C 7.8 (H) 08/13/2023   HGBA1C 7.1 (H) 05/15/2023    Physical Exam Vitals reviewed.  Constitutional:      Appearance: He is well-developed.  HENT:     Head: Normocephalic and atraumatic.     Right Ear: External ear normal.     Left Ear: External ear normal.     Mouth/Throat:     Pharynx: No oropharyngeal exudate or posterior oropharyngeal erythema.  Eyes:     Pupils: Pupils are equal, round, and reactive to light.  Cardiovascular:     Rate and Rhythm: Normal rate and regular rhythm.     Heart sounds: No murmur heard. Pulmonary:     Effort: No respiratory distress.     Breath sounds: Normal breath sounds.  Musculoskeletal:     Cervical back: Normal range of motion and neck supple.  Neurological:     Mental Status: He is alert and oriented to person, place, and time.    Physical Exam            Assessment & Plan:  Type 2 diabetes mellitus without complication,  without long-term current use of insulin  (HCC) -     Bayer DCA Hb A1c Waived -     Microalbumin / creatinine urine ratio -     metFORMIN  HCl ER; Take 2 tablets (1,500 mg total) by mouth daily with breakfast.  Dispense: 180 tablet; Refill: 3  Hyperlipidemia associated with type 2 diabetes mellitus (HCC) -     Lipid panel -     Comprehensive metabolic panel with GFR  Essential hypertension -     Comprehensive metabolic panel with GFR  ASCVD (arteriosclerotic cardiovascular disease) -     Lipid panel -     Comprehensive metabolic panel with GFR -     Atorvastatin  Calcium ; Take 1 tablet (80 mg total) by mouth daily.  Dispense: 90 tablet; Refill: 0  High blood pressure associated with diabetes (HCC) -     Atorvastatin  Calcium ; Take  1 tablet (80 mg total) by mouth daily.  Dispense: 90 tablet; Refill: 0  Other orders -     Valsartan ; Take 1 tablet (320 mg total) by mouth daily.  Dispense: 90 tablet; Refill: 3 -     Pantoprazole  Sodium; TAKE 1 TABLET BY MOUTH DAILY FOR STOMACH  Dispense: 90 tablet; Refill: 3  Assessment and Plan Assessment & Plan  Assessment and Plan    Type 2 diabetes mellitus A1c at 7.6% indicates suboptimal control. Recommended reducing starch intake and avoiding sugary foods and beverages. - Advised reducing starch intake: bread, pasta, potatoes, rice, corn. - Limited fruit intake to two bananas daily. - Advised avoiding sugary foods and beverages.  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 Emphasized cholesterol management to prevent complications. - Continue cholesterol management and monitoring. - Encouraged adherence to cardiovascular dietary recommendations.         Follow-up: Return in about 3 months (around 05/25/2024) for diabetes, hypertension, cholesterol, CAD.  Butler Der, M.D.

## 2024-02-24 LAB — COMPREHENSIVE METABOLIC PANEL WITH GFR
ALT: 20 IU/L (ref 0–44)
AST: 21 IU/L (ref 0–40)
Albumin: 4.7 g/dL (ref 3.7–4.7)
Alkaline Phosphatase: 67 IU/L (ref 48–129)
BUN/Creatinine Ratio: 12 (ref 10–24)
BUN: 14 mg/dL (ref 8–27)
Bilirubin Total: 0.5 mg/dL (ref 0.0–1.2)
CO2: 21 mmol/L (ref 20–29)
Calcium: 9.9 mg/dL (ref 8.6–10.2)
Chloride: 103 mmol/L (ref 96–106)
Creatinine, Ser: 1.18 mg/dL (ref 0.76–1.27)
Globulin, Total: 2 g/dL (ref 1.5–4.5)
Glucose: 119 mg/dL — ABNORMAL HIGH (ref 70–99)
Potassium: 5.2 mmol/L (ref 3.5–5.2)
Sodium: 140 mmol/L (ref 134–144)
Total Protein: 6.7 g/dL (ref 6.0–8.5)
eGFR: 62 mL/min/1.73 (ref >59)

## 2024-02-24 LAB — LIPID PANEL
Chol/HDL Ratio: 2.6 ratio (ref 0.0–5.0)
Cholesterol, Total: 104 mg/dL (ref 100–199)
HDL: 40 mg/dL (ref >39)
LDL Chol Calc (NIH): 47 mg/dL (ref 0–99)
Triglycerides: 82 mg/dL (ref 0–149)
VLDL Cholesterol Cal: 17 mg/dL (ref 5–40)

## 2024-02-24 NOTE — Progress Notes (Signed)
 Hello Dyami,  Your lab result is normal and/or stable.Some minor variations that are not significant are commonly marked abnormal, but do not represent any medical problem for you.  Best regards, Mechele Claude, M.D.

## 2024-05-24 ENCOUNTER — Other Ambulatory Visit: Payer: Self-pay | Admitting: Family Medicine

## 2024-05-24 DIAGNOSIS — I2510 Atherosclerotic heart disease of native coronary artery without angina pectoris: Secondary | ICD-10-CM

## 2024-05-24 DIAGNOSIS — E1159 Type 2 diabetes mellitus with other circulatory complications: Secondary | ICD-10-CM

## 2024-05-25 ENCOUNTER — Encounter: Payer: Self-pay | Admitting: Family Medicine

## 2024-05-25 ENCOUNTER — Ambulatory Visit: Admitting: Family Medicine

## 2024-05-25 VITALS — BP 165/77 | HR 60 | Temp 97.3°F | Ht 69.0 in | Wt 171.2 lb

## 2024-05-25 DIAGNOSIS — I10 Essential (primary) hypertension: Secondary | ICD-10-CM

## 2024-05-25 DIAGNOSIS — E119 Type 2 diabetes mellitus without complications: Secondary | ICD-10-CM

## 2024-05-25 DIAGNOSIS — E1169 Type 2 diabetes mellitus with other specified complication: Secondary | ICD-10-CM

## 2024-05-25 LAB — BAYER DCA HB A1C WAIVED: HB A1C (BAYER DCA - WAIVED): 7.6 % — ABNORMAL HIGH (ref 4.8–5.6)

## 2024-05-25 LAB — LIPID PANEL

## 2024-05-25 NOTE — Progress Notes (Signed)
 "  Subjective:  Patient ID: Jesus Curtis, male    DOB: 1940/08/25  Age: 84 y.o. MRN: [id number]  CC: Medical Management of Chronic Issues   HPI  Discussed the use of AI scribe software for clinical note transcription with the patient, who gave verbal consent to proceed.  History of Present Illness Jesus Curtis is an 84 year old male with diabetes and hypertension who presents for routine follow-up.  He has been monitoring his blood glucose levels, which have been stable, with recent readings of 108 mg/dL and 834 mg/dL postprandially. He has made dietary changes, including eliminating soda and tea, to better manage his diabetes. His hemoglobin A1c is 7.6, slightly above the target of 7.0.  His home blood pressure readings are 128/70 mmHg, but in the clinic, it was 175 mmHg. He has a history of a myocardial infarction and has since reduced his alcohol intake, previously consuming a glass of wine daily. He is also focusing on limiting carbohydrates, sweets, and starches to manage his diabetes.          02/23/2024    3:43 PM 08/13/2023    9:15 AM 05/15/2023    7:57 AM  Depression screen PHQ 2/9  Decreased Interest 0 0   Down, Depressed, Hopeless 0 0   PHQ - 2 Score 0 0   Altered sleeping 0 0   Tired, decreased energy 0 0   Change in appetite 0 0   Feeling bad or failure about yourself  0 0   Trouble concentrating 0 0   Moving slowly or fidgety/restless 0 0   Suicidal thoughts 0 0   PHQ-9 Score 0  0    Difficult doing work/chores Not difficult at all  Not difficult at all     Data saved with a previous flowsheet row definition    History Jesus Curtis has a past medical history of Abnormal stress test, Aortic atherosclerosis, Chest pain, Elevated hemoglobin A1c, Elevated troponin, Hypertension, Nausea & vomiting, Snake bite, SVT (supraventricular tachycardia), and Syncope.   He has a past surgical history that includes LEFT HEART CATH AND CORONARY ANGIOGRAPHY (N/A, 11/21/2016);  Coronary artery bypass graft (N/A, 11/24/2016); and TEE without cardioversion (N/A, 11/24/2016).   His family history includes Heart attack in his brother, brother, father, and mother; Heart disease in his brother, father, mother, and paternal grandfather; Hypertension in his father, mother, and paternal grandfather; Stroke in his brother, maternal grandmother, and paternal grandmother.He reports that he has never smoked. He has never used smokeless tobacco. He reports that he does not drink alcohol and does not use drugs.    ROS Review of Systems  Constitutional: Negative.   HENT: Negative.    Eyes:  Negative for visual disturbance.  Respiratory:  Negative for cough and shortness of breath.   Cardiovascular:  Negative for chest pain and leg swelling.  Gastrointestinal:  Negative for abdominal pain, diarrhea, nausea and vomiting.  Genitourinary:  Negative for difficulty urinating.  Musculoskeletal:  Negative for arthralgias and myalgias.  Skin:  Negative for rash.  Neurological:  Negative for headaches.  Psychiatric/Behavioral:  Negative for sleep disturbance.     Objective:  BP (!) 165/77   Pulse 60   Temp (!) 97.3 F (36.3 C)   Ht 5' 9 (1.753 m)   Wt 171 lb 4 oz (77.7 kg)   SpO2 97%   BMI 25.29 kg/m   BP Readings from Last 3 Encounters:  05/25/24 (!) 165/77  02/23/24 134/68  11/12/23 (!) 159/74  Wt Readings from Last 3 Encounters:  05/25/24 171 lb 4 oz (77.7 kg)  02/23/24 170 lb (77.1 kg)  11/12/23 170 lb (77.1 kg)     Physical Exam Physical Exam GENERAL: Alert, cooperative, well developed, no acute distress HEENT: Normocephalic, normal oropharynx, moist mucous membranes CHEST: Clear to auscultation bilaterally, No wheezes, rhonchi, or crackles CARDIOVASCULAR: Normal heart rate and rhythm, S1 and S2 normal without murmurs ABDOMEN: Soft, non-tender, non-distended, without organomegaly, Normal bowel sounds EXTREMITIES: No cyanosis or edema NEUROLOGICAL: Cranial  nerves grossly intact, Moves all extremities without gross motor or sensory deficit   Assessment & Plan:  Type 2 diabetes mellitus without complication, without long-term current use of insulin  (HCC) -     Bayer DCA Hb A1c Waived -     Vitamin B12  Essential hypertension -     CBC with Differential/Platelet  Hyperlipidemia associated with type 2 diabetes mellitus (HCC) -     Lipid panel -     [id number]+EGFR    Assessment and Plan Assessment & Plan Type 2 diabetes mellitus   His recent A1c is 7.6%, slightly above the target of 7.0%. Blood glucose levels are generally well-controlled, with a recent postprandial reading of 165 mg/dL, which is acceptable. He has made lifestyle changes, such as reducing soda and alcohol intake, which should positively impact A1c levels over the next three months. Encourage continued lifestyle modifications, including reducing carbohydrate intake and avoiding sweets and starches. Reassess A1c in three months.  Essential hypertension   Recent home blood pressure readings are generally below 130/80 mmHg, but elevated in the office, likely due to white coat syndrome. It is important to maintain blood pressure below 130/80 mmHg to reduce the risk of stroke, especially with diabetes. Monitor blood pressure regularly at home, ensuring readings are below 130/80 mmHg. Use an accurate blood pressure monitor, such as Omron, and verify accuracy by comparing with office readings. Emphasize the importance of maintaining blood pressure control to prevent stroke.       Follow-up: Return in about 3 months (around 08/22/2024).  Butler Der, M.D. "

## 2024-05-26 LAB — CBC WITH DIFFERENTIAL/PLATELET
Basophils Absolute: 0 10*3/uL (ref 0.0–0.2)
Basos: 1 %
EOS (ABSOLUTE): 0.3 10*3/uL (ref 0.0–0.4)
Eos: 6 %
Hematocrit: 41.7 % (ref 37.5–51.0)
Hemoglobin: 14 g/dL (ref 13.0–17.7)
Immature Grans (Abs): 0 10*3/uL (ref 0.0–0.1)
Immature Granulocytes: 0 %
Lymphocytes Absolute: 2.7 10*3/uL (ref 0.7–3.1)
Lymphs: 45 %
MCH: 30.6 pg (ref 26.6–33.0)
MCHC: 33.6 g/dL (ref 31.5–35.7)
MCV: 91 fL (ref 79–97)
Monocytes Absolute: 0.5 10*3/uL (ref 0.1–0.9)
Monocytes: 8 %
Neutrophils Absolute: 2.4 10*3/uL (ref 1.4–7.0)
Neutrophils: 40 %
Platelets: 214 10*3/uL (ref 150–450)
RBC: 4.57 x10E6/uL (ref 4.14–5.80)
RDW: 13.4 % (ref 11.6–15.4)
WBC: 5.8 10*3/uL (ref 3.4–10.8)

## 2024-05-26 LAB — CMP14+EGFR
ALT: 23 [IU]/L (ref 0–44)
AST: 24 [IU]/L (ref 0–40)
Albumin: 4.4 g/dL (ref 3.7–4.7)
Alkaline Phosphatase: 67 [IU]/L (ref 48–129)
BUN/Creatinine Ratio: 9 — AB (ref 10–24)
BUN: 9 mg/dL (ref 8–27)
Bilirubin Total: 0.4 mg/dL (ref 0.0–1.2)
CO2: 18 mmol/L — AB (ref 20–29)
Calcium: 9.6 mg/dL (ref 8.6–10.2)
Chloride: 104 mmol/L (ref 96–106)
Creatinine, Ser: 0.99 mg/dL (ref 0.76–1.27)
Globulin, Total: 2.3 g/dL (ref 1.5–4.5)
Glucose: 165 mg/dL — AB (ref 70–99)
Potassium: 4.9 mmol/L (ref 3.5–5.2)
Sodium: 138 mmol/L (ref 134–144)
Total Protein: 6.7 g/dL (ref 6.0–8.5)
eGFR: 76 mL/min/{1.73_m2}

## 2024-05-26 LAB — LIPID PANEL
Cholesterol, Total: 79 mg/dL — AB (ref 100–199)
HDL: 33 mg/dL — AB
LDL CALC COMMENT:: 2.4 ratio (ref 0.0–5.0)
LDL Chol Calc (NIH): 29 mg/dL (ref 0–99)
Triglycerides: 78 mg/dL (ref 0–149)
VLDL Cholesterol Cal: 17 mg/dL (ref 5–40)

## 2024-05-26 LAB — VITAMIN B12: Vitamin B-12: 234 pg/mL (ref 232–1245)

## 2024-07-29 ENCOUNTER — Ambulatory Visit: Admitting: Cardiology

## 2024-11-23 ENCOUNTER — Ambulatory Visit: Admitting: Family Medicine
